# Patient Record
Sex: Female | Born: 1945 | Race: White | Hispanic: No | Smoking: Former smoker
Health system: Southern US, Community
[De-identification: ages and names within clinical notes are randomized; demographics above are authoritative.]

## PROBLEM LIST (undated history)

## (undated) DIAGNOSIS — Z87440 Personal history of urinary (tract) infections: Secondary | ICD-10-CM

## (undated) DIAGNOSIS — N281 Cyst of kidney, acquired: Secondary | ICD-10-CM

## (undated) DIAGNOSIS — E785 Hyperlipidemia, unspecified: Secondary | ICD-10-CM

## (undated) DIAGNOSIS — Q638 Other specified congenital malformations of kidney: Secondary | ICD-10-CM

## (undated) DIAGNOSIS — C679 Malignant neoplasm of bladder, unspecified: Secondary | ICD-10-CM

## (undated) DIAGNOSIS — Z860101 Personal history of adenomatous and serrated colon polyps: Secondary | ICD-10-CM

## (undated) DIAGNOSIS — I10 Essential (primary) hypertension: Secondary | ICD-10-CM

## (undated) DIAGNOSIS — Z973 Presence of spectacles and contact lenses: Secondary | ICD-10-CM

## (undated) DIAGNOSIS — Z86010 Personal history of colonic polyps: Secondary | ICD-10-CM

## (undated) HISTORY — DX: Hyperlipidemia, unspecified: E78.5

## (undated) HISTORY — PX: TONSILLECTOMY: SUR1361

---

## 1993-04-10 HISTORY — PX: OTHER SURGICAL HISTORY: SHX169

## 1998-04-10 HISTORY — PX: BUNIONECTOMY: SHX129

## 1999-08-15 ENCOUNTER — Other Ambulatory Visit: Admission: RE | Admit: 1999-08-15 | Discharge: 1999-08-15 | Payer: Self-pay | Admitting: Obstetrics and Gynecology

## 1999-10-07 ENCOUNTER — Encounter (INDEPENDENT_AMBULATORY_CARE_PROVIDER_SITE_OTHER): Payer: Self-pay

## 1999-10-07 ENCOUNTER — Other Ambulatory Visit: Admission: RE | Admit: 1999-10-07 | Discharge: 1999-10-07 | Payer: Self-pay | Admitting: Obstetrics and Gynecology

## 2000-08-16 ENCOUNTER — Other Ambulatory Visit: Admission: RE | Admit: 2000-08-16 | Discharge: 2000-08-16 | Payer: Self-pay | Admitting: Obstetrics and Gynecology

## 2000-12-22 ENCOUNTER — Emergency Department (HOSPITAL_COMMUNITY): Admission: EM | Admit: 2000-12-22 | Discharge: 2000-12-23 | Payer: Self-pay | Admitting: Emergency Medicine

## 2000-12-22 ENCOUNTER — Encounter: Payer: Self-pay | Admitting: Emergency Medicine

## 2001-08-02 ENCOUNTER — Encounter: Payer: Self-pay | Admitting: Obstetrics and Gynecology

## 2001-08-02 ENCOUNTER — Encounter: Admission: RE | Admit: 2001-08-02 | Discharge: 2001-08-02 | Payer: Self-pay | Admitting: Obstetrics and Gynecology

## 2008-05-01 ENCOUNTER — Ambulatory Visit: Payer: Self-pay | Admitting: Pulmonary Disease

## 2008-05-01 ENCOUNTER — Inpatient Hospital Stay (HOSPITAL_COMMUNITY): Admission: EM | Admit: 2008-05-01 | Discharge: 2008-05-05 | Payer: Self-pay | Admitting: Emergency Medicine

## 2008-05-04 ENCOUNTER — Encounter: Payer: Self-pay | Admitting: Internal Medicine

## 2010-05-01 ENCOUNTER — Encounter: Payer: Self-pay | Admitting: Internal Medicine

## 2010-05-01 ENCOUNTER — Encounter: Payer: Self-pay | Admitting: Emergency Medicine

## 2010-07-25 LAB — CBC
HCT: 29.3 % — ABNORMAL LOW (ref 36.0–46.0)
Hemoglobin: 9.8 g/dL — ABNORMAL LOW (ref 12.0–15.0)
MCHC: 33.2 g/dL (ref 30.0–36.0)
MCHC: 33.3 g/dL (ref 30.0–36.0)
MCHC: 33.4 g/dL (ref 30.0–36.0)
MCV: 89.2 fL (ref 78.0–100.0)
MCV: 91 fL (ref 78.0–100.0)
Platelets: 197 10*3/uL (ref 150–400)
Platelets: 209 10*3/uL (ref 150–400)
Platelets: 222 10*3/uL (ref 150–400)
Platelets: 223 10*3/uL (ref 150–400)
RBC: 3.32 MIL/uL — ABNORMAL LOW (ref 3.87–5.11)
RDW: 18.9 % — ABNORMAL HIGH (ref 11.5–15.5)
WBC: 6.5 10*3/uL (ref 4.0–10.5)
WBC: 8 10*3/uL (ref 4.0–10.5)

## 2010-07-25 LAB — GLUCOSE, CAPILLARY
Glucose-Capillary: 172 mg/dL — ABNORMAL HIGH (ref 70–99)
Glucose-Capillary: 174 mg/dL — ABNORMAL HIGH (ref 70–99)
Glucose-Capillary: 202 mg/dL — ABNORMAL HIGH (ref 70–99)
Glucose-Capillary: 216 mg/dL — ABNORMAL HIGH (ref 70–99)
Glucose-Capillary: 226 mg/dL — ABNORMAL HIGH (ref 70–99)
Glucose-Capillary: 289 mg/dL — ABNORMAL HIGH (ref 70–99)
Glucose-Capillary: 93 mg/dL (ref 70–99)

## 2010-07-25 LAB — ACETAMINOPHEN LEVEL: Acetaminophen (Tylenol), Serum: <10 ug/mL — ABNORMAL LOW (ref 10–30)

## 2010-07-25 LAB — RAPID URINE DRUG SCREEN, HOSP PERFORMED
Benzodiazepines: POSITIVE — AB
Cocaine: NOT DETECTED
Tetrahydrocannabinol: NOT DETECTED

## 2010-07-25 LAB — POCT I-STAT 3, ART BLOOD GAS (G3+)
Acid-base deficit: 1 mmol/L (ref 0.0–2.0)
Acid-base deficit: 5 mmol/L — ABNORMAL HIGH (ref 0.0–2.0)
Bicarbonate: 25.4 mEq/L — ABNORMAL HIGH (ref 20.0–24.0)
O2 Saturation: 90 %
O2 Saturation: 96 %
Patient temperature: 96.6
Patient temperature: 97.6
TCO2: 23 mmol/L (ref 0–100)
TCO2: 27 mmol/L (ref 0–100)
pCO2 arterial: 41.3 mmHg (ref 35.0–45.0)

## 2010-07-25 LAB — CULTURE, BLOOD (ROUTINE X 2): Culture: NO GROWTH

## 2010-07-25 LAB — POCT I-STAT, CHEM 8
BUN: 49 mg/dL — ABNORMAL HIGH (ref 6–23)
Chloride: 102 mEq/L (ref 96–112)
Creatinine, Ser: 2.4 mg/dL — ABNORMAL HIGH (ref 0.4–1.2)
Glucose, Bld: 144 mg/dL — ABNORMAL HIGH (ref 70–99)
Potassium: 4.2 mEq/L (ref 3.5–5.1)
Sodium: 135 mEq/L (ref 135–145)

## 2010-07-25 LAB — PROTIME-INR
Prothrombin Time: 14.8 seconds (ref 11.6–15.2)
Prothrombin Time: 17.1 seconds — ABNORMAL HIGH (ref 11.6–15.2)

## 2010-07-25 LAB — BASIC METABOLIC PANEL
BUN: 12 mg/dL (ref 6–23)
BUN: 18 mg/dL (ref 6–23)
BUN: 35 mg/dL — ABNORMAL HIGH (ref 6–23)
CO2: 24 mEq/L (ref 19–32)
Chloride: 104 mEq/L (ref 96–112)
Chloride: 106 mEq/L (ref 96–112)
Creatinine, Ser: 1.14 mg/dL (ref 0.4–1.2)
Creatinine, Ser: 1.22 mg/dL — ABNORMAL HIGH (ref 0.4–1.2)
Creatinine, Ser: 1.64 mg/dL — ABNORMAL HIGH (ref 0.4–1.2)
GFR calc non Af Amer: 49 mL/min — ABNORMAL LOW (ref >60)
Glucose, Bld: 268 mg/dL — ABNORMAL HIGH (ref 70–99)
Potassium: 4 mEq/L (ref 3.5–5.1)
Potassium: 4.3 mEq/L (ref 3.5–5.1)

## 2010-07-25 LAB — URINE CULTURE: Culture: NO GROWTH

## 2010-07-25 LAB — DIFFERENTIAL
Basophils Relative: 1 % (ref 0–1)
Eosinophils Absolute: 0.2 10*3/uL (ref 0.0–0.7)
Lymphocytes Relative: 28 % (ref 12–46)
Lymphs Abs: 2.2 10*3/uL (ref 0.7–4.0)
Monocytes Absolute: 0.6 10*3/uL (ref 0.1–1.0)
Monocytes Relative: 7 % (ref 3–12)
Neutro Abs: 4.9 10*3/uL (ref 1.7–7.7)

## 2010-07-25 LAB — URINALYSIS, ROUTINE W REFLEX MICROSCOPIC
Glucose, UA: NEGATIVE mg/dL
Hgb urine dipstick: NEGATIVE
Specific Gravity, Urine: 1.013 (ref 1.005–1.030)
Urobilinogen, UA: 1 mg/dL (ref 0.0–1.0)

## 2010-07-25 LAB — HEPATIC FUNCTION PANEL
ALT: 14 U/L (ref 0–35)
AST: 19 U/L (ref 0–37)
Alkaline Phosphatase: 75 U/L (ref 39–117)
Bilirubin, Direct: 0.3 mg/dL (ref 0.0–0.3)
Total Bilirubin: 0.8 mg/dL (ref 0.3–1.2)

## 2010-07-25 LAB — CARDIAC PANEL(CRET KIN+CKTOT+MB+TROPI)
CK, MB: 1.4 ng/mL (ref 0.3–4.0)
Total CK: 55 U/L (ref 7–177)
Troponin I: 0.01 ng/mL (ref 0.00–0.06)
Troponin I: <0.01 ng/mL (ref 0.00–0.06)

## 2010-07-25 LAB — CK TOTAL AND CKMB (NOT AT ARMC)
CK, MB: 1.8 ng/mL (ref 0.3–4.0)
Total CK: 83 U/L (ref 7–177)

## 2010-07-25 LAB — TROPONIN I: Troponin I: <0.01 ng/mL (ref 0.00–0.06)

## 2010-07-25 LAB — TRICYCLICS SCREEN, URINE: TCA Scrn: POSITIVE — AB

## 2010-07-25 LAB — CARBOXYHEMOGLOBIN: O2 Saturation: 55.6 %

## 2010-08-23 NOTE — Discharge Summary (Signed)
NAMETHOMASENE, Barton             ACCOUNT NO.:  [id number]   MEDICAL RECORD NO.:  [id number]          PATIENT TYPE:  INP   LOCATION:  2115                         FACILITY:  [facility]   PHYSICIAN:  Oretha Milch, MD      DATE OF BIRTH:  05/22/1945   DATE OF ADMISSION:  05/01/2008  DATE OF DISCHARGE:  05/05/2008                               DISCHARGE SUMMARY   FINAL DIAGNOSES:  1. Shock (unspecified).  Presumed multifactorial in the setting of      unintentional morphine overdose, polypharmacy, and hypovolemia,      resolved.  2. Atrial fibrillation with rapid ventricular response.  3. Acute renal failure.  4. Hypothyroidism.  5. Altered mental status.  6. Presumed pneumonia.  7. Presumed chronic obstructive pulmonary disease.  8. Diarrhea (resolved).   LABORATORY DATA:  INR of 1.4, this was obtained on May 05, 2008.  Sodium 130, potassium 3.8, chloride 106, CO2 of 24, glucose 160, BUN 12,  and creatinine 1.22.  Hemoglobin 11, hematocrit 32.9, platelet count  223, and white blood cell count 8.6.  Blood culture and urine culture  were both negative.   PROCEDURES:  1. Left radial A-line, this was placed on May 02, 2007 and removed      on May 04, 2007.  2. Right subclavian triple-lumen catheter placed on January 22 and      removed on January 24.   ANTIBIOTICS:  I empirically treated with Avelox and Azactam, this was on  May 01, 2008 to May 03, 2008.   BRIEF HISTORY:  This is a 65 year old obese female presented on May 01, 2008, with altered mental status, decreased level of consciousness,  and shock, which required aggressive volume resuscitation, and re-  support with vasoactive drips.   PAST MEDICAL HISTORY:  Complicated and include obesity, insulin-  dependent diabetes, coronary artery disease, atrial fibrillation,  chronic obstructive pulmonary disease, and smoking as well as  significant pain, anxiety which is chronic in nature.   HOSPITAL  COURSE BY DISCHARGE DIAGNOSES:  1. Shock (unspecified).  Ms. [name] was admitted to the Intensive Care      following evaluation in the emergency room.  Ultimately, diagnostic      evaluation was not consistent with severe sepsis or septic shock,      more likely felt to be unintentional with morphine overdose given      her morphine was filled on April 10, 2008, and upon time of      evaluation, pill bottle was empty.  She believes she may have been      unintentionally taking morphine when she was intending to take her      Flexeril as needed, and then continuing her as needed morphine as      previously prescribed.  At any rate, [name]'s antihypertensives,      narcotics, and other anxiolytic medications were held during her      acute hospitalization.  She required IV fluid crystalloid      resuscitation, as well as re-vasoactive support on Levophed.  She      was successfully  weaned off vasoactive support, her blood pressure      normalized, and upon time of discharge, she is hemodynamically      stable with shock resolved.  2. Atrial fibrillation with rapid ventricular response.  Ms. [name]      does have a known history of atrial fibrillation for which at home,      she is on Cardizem, diltiazem, and Coumadin.  During her shock      resuscitation, her Cardizem was held.  Unfortunately, this did      result in her rapid ventricular response and her already known      atrial fibrillation.  After resuming her home Cardizem dose, this      is now resolved.  She continues to be in atrial fibrillation, she      will be discharged to home on her normal regimen consisting of      diltiazem, digoxin, and Cardizem.  A special note upon time of      discharge, [name]'s INR is subtherapeutic at 1.4.  I have      discussed this with her primary care physician and he will follow      up this in the outpatient setting, she will be placed on her      maintenance Coumadin dosing as previously  prescribed.  3. Acute renal failure.  This is secondary to volume depletion and      shock.  Upon time of discharge, her creatinine has normalized, her      blood pressure has improved.  Upon time of discharge given her      recent renal injury and normal blood pressure at this point      recorded at 124/69, we will hold off on resuming her lisinopril,      furosemide, as well as her isosorbide mononitrate.  We will defer      this to her primary care physician as to timing of      resuming/reintroduced these medications.  From her blood pressure      standpoint, she will be discharged to home as previously mentioned      on Cardizem, which seemed to be controlling blood pressure well at      this point.  4. Hypothyroidism.  For this, she will continue her Synthroid.  5. Altered mental status.  This is now resolved, and presumed      secondary to unintentional overdose of morphine.  She has a      complicated chronic pain history, as well as a chronic anxiety      history.  She has a pain contract with her orthopedist, therefore,      she will not be sent home with a morphine prescription.  Her      regimen has been altered and as far as her Seroquel has been half      dosed and her Prozac has been decreased, and she has been      instructed to discontinue Lyrica.  6. Presumed pneumonia.  No clear evidence of this clinically;      therefore, antibiotics were discontinued.  7. Probable chronic obstructive pulmonary disease.  For this, she will      continue p.r.n. ProAir.  8. Diarrhea.  This is likely secondary to antibiotics and is now      resolved.  9. Diabetes type 2 with hyperglycemia.  For this, she will resume her      home diabetic regimen.   DISCHARGE INSTRUCTIONS:  DIET:  Low-carbohydrate diet.   FOLLOWUP:  Follow up with Dr. Ludwig Clarks on Friday, May 08, 2008, at 12  noon.   DISCHARGE MEDICATIONS:  1. Coumadin 2.5 mg daily.  2. Lantus 82 units daily subcu.  3.  Omeprazole 40 daily.  4. Novolin N 40 units before bed.  5. Apidra 20 units before meals.  6. Seroquel 100 mg p.o. twice a day.  She has 200 mg tablets at home,      and has been instructed to take half a tablet twice a day.  7. Diazepam 5 mg 3 times a day.  8. Trilipix 135 mg daily.  9. Lanoxin 0.125 mg daily.  10.Fluoxetine 40 mg daily.  11.Crestor 40 mg daily.  12.Glipizide 10 mg daily.  13.Synthroid 75 mcg daily.  14.Morphine 15 mg every 6 hours as needed for pain.  15.ProAir HFA 2 puffs as needed every 4 hours for shortness of breath.   She was given a clear bag writing clearly with do not take medications  upon the time of discharge with the intention to slowly add that  medications as indicated with the guidance of her primary care  physician.  In this bag, her medication include Lyrica, Flexeril,  lisinopril, Lasix, and her isosorbide mononitrate.   Upon time of discharge, Ms. [name] has met maximum benefit from inpatient  stay.  She is medically cleared for discharge following the treatments  and discharge diagnoses mentioned above.  I have spoken to her primary  care physician via phone and updated him on her progress and medical  case.  She is now cleared medically for discharge with followup as  indicated above.   Over 30 minutes of time was dedicated to discharge instruction and  planning.      Zenia Resides, NP      Oretha Milch, MD  Electronically Signed    PB/MEDQ  D:  05/05/2008  T:  05/05/2008  Job:  [id number]   cc:   Ralene Ok, M.D.

## 2010-08-23 NOTE — H&P (Signed)
NAMEPRINCESSA, LESMEISTER NO.:  [id number]   MEDICAL RECORD NO.:  [id number]          PATIENT TYPE:  INP   LOCATION:  2115                         FACILITY:  [facility]   PHYSICIAN:  Oley Balm. Sung Amabile, MD   DATE OF BIRTH:  [birth year]   DATE OF ADMISSION:  05/01/2008  DATE OF DISCHARGE:                              HISTORY & PHYSICAL   ADMISSION DIAGNOSES:  1. Altered mental status.  2. Shock, unclear etiology.  3. Possible community-acquired pneumonia.   HISTORY OF PRESENT ILLNESS:  The patient is a 65 year old woman who  presented to the emergency department via transport by emergency medical  services on the afternoon of this day.  Per the records they got in the  evening of this day, she was speaking to a home health nurse on the  phone and apparently lost consciousness.  The home health nurse  despatched EMS to the home and she was found minimally responsive.  She  was transported to emergency department at [facility]  where she was found to be hypotensive.  Over the next 2 to 3 hours, she  received repeated boluses of normal saline with transient improvement in  her blood pressure.  However, her blood pressure remains low and  consequently critical care medicine is asked to evaluate her for further  management.  Upon my evaluation, she is quite lethargic but answers  questions seemingly appropriately.  She denies shortness of breath and  pain.  She has no antecedent cough, sputum production, or chest pain.  She denies hemoptysis, nausea, vomiting, diarrhea, dysuria.  She denies  taking excess morphine beyond what is already prescribed and what is her  usual dose.   PAST MEDICAL HISTORY:  1. Coronary artery disease,  2. COPD.  3. Atrial fibrillation.  4. Mitral insufficiency.  5. Morbid obesity.  6. Peripheral vascular disease.  7. Congestive heart failure.  8. Chronic venous stasis.   CURRENT MEDICATIONS:  (These are derived from previous  hospital  documentation).  1. Coumadin.  2. Morphine 15 mg q.6 h.  3. Lantus 82 units daily.  4. Novolin N 40 units q.h.s.  5. Lasix 80 mg daily.  6. Apidra 20 units subcu q.a.c.  7. Synthroid 75 mcg daily  8. Lisinopril 5 mg b.i.d.  9. Seroquel 300 mg b.i.d.  10.Valium 5 mg t.i.d.  11.Trilipix 130 mg daily.  12.Imdur 30 mg daily.  13.Omeprazole 40 mg daily.  14.Digoxin 0.125 mg daily.  15.Lyrica 75 mg t.i.d.  16.Prozac 60 mg daily.  17.Crestor 40 mg daily.  18.Glipizide 10 mg daily.  19.Ultram 100 mg daily.  20.Provera h.s. q.i.d.   SOCIAL HISTORY:  Per records, she is disabled with cough and as of her  last hospitalization in September, it is documented that she is a  smoker.   FAMILY HISTORY:  Reviewed from prior records and otherwise  noncontributory.   REVIEW OF SYSTEMS:  As per the history present illness and otherwise  unavailable and noncontributory.   PHYSICAL EXAMINATION:  VITAL SIGNS:  Temperature 97.8, current blood  pressure is 89/49, pulse 123 and irregular, respirations  18-20 and  unlabored.  Oxygen saturation 90% on 4 liters by nasal cannula.  GENERAL:  She is lethargic but responds to questions with some  difficulty.  Her answers are seemingly appropriate.  HEENT: Reveals no acute abnormalities.  Cranial nerves appear to be  intact.  Pupils are normal and react to light.  NECK:  Supple without adenopathy or visible jugular venous distention.  Thyroid gland is normal.  CARDIAC:  Exam reveals tachycardia with an irregularly irregular rhythm  and no murmurs noted.  Chest is clear to auscultation and percussion  anteriorly.  Abdomen is obese, soft, nontender with diminished bowel  sounds.  There is no palpable organ enlargement.  EXTREMITIES:  Reveal  chronic venous stasis changes with trace bilateral pretibial edema which  is symmetric.  NEUROLOGIC:  Cranial nerves as documented above.  Moves all extremities  symmetrically.   DATA:  Initial chest x-ray  reveals possible right lower lobe infiltrate  versus atelectasis.  CT scan of the head was poor quality due to motion  artifact the revealed no acute abnormalities.  Laboratory data reveals a  hemoglobin of 9.8, white blood cell count 8009, no left shift, platelets  normal, chemistries normal except a creatinine of 2.4 and glucose of  144, prothrombin time of 17.1 seconds with an INR 1.3, liver function  tests normal, urine drug screen positive for opiates, benzos, and  tricyclic antidepressants.  EKG reveals atrial fibrillation with rapid  ventricular response, nonspecific interventricular conduction delay,  nonspecific ST and T-wave abnormalities.   IMPRESSION:  Altered mental status and shock refractory with IV fluids -  etiology unclear.  It is possible that she took an excessive  medications.  She is on a multitude of medications which is a regimen  that I think put her on higher risk for adverse reactions such as this.  However, she denies indiscretion with her prescribed medications.  Other  etiologies would include cardiogenic shock related to atrial  fibrillation or possibly even an acute coronary syndrome (though EKG  shows no definite evidence of ischemia).  Septic shock seems unlikely,  but is a possibility given the appearance of possible infiltrate in the  right lower lobe up.   PLAN:  She will be admitted to the intensive care unit for resuscitation  and stabilization.  Central venous catheter has been placed in the  emergency department.  We will resuscitate her to a CVP goal of 8-12.  Mean arterial pressure goal of 60.  Phenylephrine will be used as a  pressor due to her atrial fibrillation and tachycardia.  Empiric  antibiotics with Avelox and aztreonam have been ordered (note  cephalosporin allergy).  Cardiac markers will be ordered.  All of her  medications as listed above will be held except for Synthroid and  digoxin which will be administered  intravenously.      Oley Balm Sung Amabile, MD  Electronically Signed     DBS/MEDQ  D:  05/01/2008  T:  05/02/2008  Job:  [id number]   cc:   Nanetta Batty, M.D.

## 2011-03-18 ENCOUNTER — Other Ambulatory Visit (INDEPENDENT_AMBULATORY_CARE_PROVIDER_SITE_OTHER): Payer: Self-pay

## 2011-03-18 DIAGNOSIS — N390 Urinary tract infection, site not specified: Secondary | ICD-10-CM

## 2011-05-14 ENCOUNTER — Ambulatory Visit: Payer: Self-pay | Admitting: Physician Assistant

## 2011-05-14 DIAGNOSIS — R319 Hematuria, unspecified: Secondary | ICD-10-CM

## 2011-05-14 DIAGNOSIS — R350 Frequency of micturition: Secondary | ICD-10-CM

## 2011-05-14 LAB — POCT URINALYSIS DIPSTICK
Protein, UA: 100
Spec Grav, UA: 1.015
Urobilinogen, UA: 0.2
pH, UA: 6

## 2011-05-14 LAB — POCT UA - MICROSCOPIC ONLY
Casts, Ur, LPF, POC: NEGATIVE
Crystals, Ur, HPF, POC: NEGATIVE
Epithelial cells, urine per micros: NEGATIVE
Yeast, UA: NEGATIVE

## 2011-05-14 MED ORDER — CIPROFLOXACIN HCL 500 MG PO TABS: 500.0000 mg | ORAL_TABLET | Freq: Two times a day (BID) | ORAL | Status: AC

## 2011-05-14 NOTE — Progress Notes (Signed)
  Subjective:    Patient ID: [name], female    DOB: [birth year], 66 y.o.   MRN: [id number]  HPI Patient presents with several days of urinary urgency frequency and burning. She notes the urine has changed to a darker color and believes that may be due to blood. She had some low-grade fever last night accompanied by some queasiness but has had no vomiting or diarrhea. No back pain or lower abdominal pain. She has a history of urinary tract infections and her symptoms now are consistent with previous infections.   Review of Systems  Constitutional: Positive for fever. Negative for chills, diaphoresis and fatigue.  Gastrointestinal: Positive for nausea. Negative for vomiting and abdominal pain.  Genitourinary: Positive for dysuria, urgency and frequency. Negative for flank pain, decreased urine volume, vaginal bleeding, vaginal discharge, enuresis, difficulty urinating, genital sores, vaginal pain, pelvic pain and dyspareunia.  Musculoskeletal: Negative for back pain.       Objective:   Physical Exam  Vitals reviewed. Constitutional: She is oriented to person, place, and time. Vital signs are normal. She appears well-developed and well-nourished. No distress.  HENT:  Head: Normocephalic and atraumatic.  Cardiovascular: Normal rate, regular rhythm and normal heart sounds.   Pulmonary/Chest: Effort normal and breath sounds normal.  Abdominal: Soft. Normal appearance and bowel sounds are normal. She exhibits no distension and no mass. There is no hepatosplenomegaly. There is no tenderness. There is no rebound, no guarding and no CVA tenderness.  Neurological: She is alert and oriented to person, place, and time.  Skin: Skin is warm and dry.     UA reviewed. Consistent with UTI. Urine culture is pending.     Assessment & Plan:  1. Urinary urgency and frequency. Dysuria.  Suspect UTI. Cipro 500 mg 1 by mouth twice a day x5 days. Push fluids. Rest. Return if symptoms worsen  or persist.

## 2011-05-16 LAB — URINE CULTURE

## 2011-05-19 NOTE — Telephone Encounter (Signed)
This encounter was started in error.

## 2011-11-02 ENCOUNTER — Ambulatory Visit: Payer: Self-pay | Admitting: Family Medicine

## 2011-11-02 VITALS — BP 116/82 | HR 67 | Temp 97.7°F | Resp 16 | Ht 70.0 in | Wt 208.0 lb

## 2011-11-02 DIAGNOSIS — S81809A Unspecified open wound, unspecified lower leg, initial encounter: Secondary | ICD-10-CM

## 2011-11-02 DIAGNOSIS — L237 Allergic contact dermatitis due to plants, except food: Secondary | ICD-10-CM

## 2011-11-02 DIAGNOSIS — R58 Hemorrhage, not elsewhere classified: Secondary | ICD-10-CM

## 2011-11-02 DIAGNOSIS — S81009A Unspecified open wound, unspecified knee, initial encounter: Secondary | ICD-10-CM

## 2011-11-02 DIAGNOSIS — L255 Unspecified contact dermatitis due to plants, except food: Secondary | ICD-10-CM

## 2011-11-02 DIAGNOSIS — I998 Other disorder of circulatory system: Secondary | ICD-10-CM

## 2011-11-02 DIAGNOSIS — S81802A Unspecified open wound, left lower leg, initial encounter: Secondary | ICD-10-CM

## 2011-11-02 MED ORDER — DOXYCYCLINE HYCLATE 100 MG PO TABS: 100.0000 mg | ORAL_TABLET | Freq: Two times a day (BID) | ORAL | Status: AC

## 2011-11-02 NOTE — Patient Instructions (Signed)
Return to the clinic or go to the nearest emergency room if any of your symptoms worsen or new symptoms occur.  Can apply over the counter hydrocortisone to poison ivy areas twice per day as needed.

## 2011-11-02 NOTE — Progress Notes (Signed)
  Subjective:    Patient ID: [name], female    DOB: [birth year], 66 y.o.   MRN: [id number]  HPI [name] is a 65 y.o. female  Noticed bump on L side of groin - noticed last night - bruising around central bump. No pain or itching.   No known ticks removed recently, Out in woods few days ago. Has outside dog, but no known ticks.  No fever, abd pain, or other rash except few spots of poison ivy on legs.   Tx: none   Review of Systems     Objective:   Physical Exam  Constitutional: She is oriented to person, place, and time.  Neurological: She is alert and oriented to person, place, and time.  Skin: Skin is warm and dry. Ecchymosis noted.     Psychiatric: She has a normal mood and affect. Her behavior is normal.   See photo in system.     Assessment & Plan:  [name] is a 66 y.o. female 1. Wound of left leg  doxycycline (VIBRA-TABS) 100 MG tablet  2. Ecchymosis    3. Poison ivy     L upper leg wound/bite - possible spider or other bite into capillary with secondary bruising, vs atypical presentation of target lesion.  Start doxycycline 100mg  BID x 10 days.  SED, and rtc precautions discussed.   Patient Instructions  Return to the clinic or go to the nearest emergency room if any of your symptoms worsen or new symptoms occur.  Can apply over the counter hydrocortisone to poison ivy areas twice per day as needed.

## 2011-12-22 ENCOUNTER — Ambulatory Visit: Payer: Self-pay | Admitting: Internal Medicine

## 2011-12-22 VITALS — BP 124/84 | HR 90 | Temp 98.0°F | Resp 16 | Ht 69.5 in | Wt 212.0 lb

## 2011-12-22 DIAGNOSIS — L509 Urticaria, unspecified: Secondary | ICD-10-CM

## 2011-12-22 DIAGNOSIS — R21 Rash and other nonspecific skin eruption: Secondary | ICD-10-CM

## 2011-12-22 MED ORDER — PREDNISONE 10 MG PO TABS: ORAL_TABLET | ORAL | Status: DC

## 2011-12-22 MED ORDER — HYDROXYZINE HCL 25 MG PO TABS: 25.0000 mg | ORAL_TABLET | Freq: Three times a day (TID) | ORAL | Status: AC | PRN

## 2011-12-22 MED ORDER — METHYLPREDNISOLONE SODIUM SUCC 125 MG IJ SOLR
125.0000 mg | Freq: Once | INTRAMUSCULAR | Status: AC
  Administered 2011-12-22: 125 mg via INTRAMUSCULAR

## 2011-12-22 NOTE — Progress Notes (Signed)
  Subjective:    Patient ID: [name], female    DOB: 1946/01/29, 66 y.o.   MRN: [id number]  HPI  Pt presents to clinic for eval of rash.  She has had this rash for about 4 days.  Started after a walk in the woods when she had on long pants and a t-shirt.  Then she developed a papular rash that has spread and is now covering 70% of body.  Very itchy.  New rash every day.  Sleeps with her dog who goes into the woods all the time. She has had no new meds, lotions, soaps, foods or exposures that she knows of.  She has never had this type of rash before. No fever, chills, she feels like.  She has had no tick bites known.  Review of Systems  Constitutional: Negative for fever, chills and fatigue.  HENT: Negative for congestion.   Musculoskeletal: Negative for myalgias and arthralgias.  Skin: Positive for rash.       Objective:   Physical Exam  Vitals reviewed. Constitutional: She is oriented to person, place, and time. She appears well-developed and well-nourished.  HENT:  Head: Normocephalic and atraumatic.  Right Ear: External ear normal.  Left Ear: External ear normal.  Nose: Nose normal.  Eyes: Conjunctivae normal are normal.  Pulmonary/Chest: Effort normal.  Neurological: She is alert and oriented to person, place, and time.  Skin: Skin is warm and dry. Rash noted.       70% of body covered with erythematous urticarial rash with some papules, excoriations are present on a lot of the rash.  Scalp and feet, palms and soles are spared.  Psychiatric: She has a normal mood and affect. Her behavior is normal. Thought content normal.   Results for orders placed in visit on 12/22/11  GLUCOSE, POCT (MANUAL RESULT ENTRY)      Component Value Range   POC Glucose 86  70 - 99 mg/dl       Assessment & Plan:   1. Rash  POCT glucose (manual entry), methylPREDNISolone sodium succinate (SOLU-MEDROL) 125 mg/2 mL injection 125 mg, predniSONE (DELTASONE) 10 MG tablet, hydrOXYzine  (ATARAX/VISTARIL) 25 MG tablet  2. Urticaria      I believe this is poison ivy with a hive reaction.  Her biggest concern is that she is leaving on a plane tomorrow to visit her pregnant daughter in [place].  D/w pt what to expect from prednisone use.  Pt was seen with Dr. Perrin Maltese.

## 2013-01-06 HISTORY — PX: COLONOSCOPY W/ POLYPECTOMY: SHX1380

## 2013-06-30 ENCOUNTER — Ambulatory Visit (INDEPENDENT_AMBULATORY_CARE_PROVIDER_SITE_OTHER): Payer: Medicare Other | Admitting: Family Medicine

## 2013-06-30 VITALS — BP 104/64 | HR 84 | Temp 98.0°F | Resp 18 | Ht 69.5 in | Wt 210.0 lb

## 2013-06-30 DIAGNOSIS — N390 Urinary tract infection, site not specified: Secondary | ICD-10-CM

## 2013-06-30 MED ORDER — CIPROFLOXACIN HCL 500 MG PO TABS: 500.0000 mg | ORAL_TABLET | Freq: Two times a day (BID) | ORAL | Status: DC

## 2013-06-30 NOTE — Patient Instructions (Signed)
The medicine should clear up your symptoms.    If you start having fevers, chills, back pain, nausea or vomiting, please come back or go to the ED.    Urinary Tract Infection Urinary tract infections (UTIs) can develop anywhere along your urinary tract. Your urinary tract is your body's drainage system for removing wastes and extra water. Your urinary tract includes two kidneys, two ureters, a bladder, and a urethra. Your kidneys are a pair of bean-shaped organs. Each kidney is about the size of your fist. They are located below your ribs, one on each side of your spine. CAUSES Infections are caused by microbes, which are microscopic organisms, including fungi, viruses, and bacteria. These organisms are so small that they can only be seen through a microscope. Bacteria are the microbes that most commonly cause UTIs. SYMPTOMS  Symptoms of UTIs may vary by age and gender of the patient and by the location of the infection. Symptoms in young women typically include a frequent and intense urge to urinate and a painful, burning feeling in the bladder or urethra during urination. Older women and men are more likely to be tired, shaky, and weak and have muscle aches and abdominal pain. A fever may mean the infection is in your kidneys. Other symptoms of a kidney infection include pain in your back or sides below the ribs, nausea, and vomiting. DIAGNOSIS To diagnose a UTI, your caregiver will ask you about your symptoms. Your caregiver also will ask to provide a urine sample. The urine sample will be tested for bacteria and white blood cells. White blood cells are made by your body to help fight infection. TREATMENT  Typically, UTIs can be treated with medication. Because most UTIs are caused by a bacterial infection, they usually can be treated with the use of antibiotics. The choice of antibiotic and length of treatment depend on your symptoms and the type of bacteria causing your infection. HOME CARE  INSTRUCTIONS  If you were prescribed antibiotics, take them exactly as your caregiver instructs you. Finish the medication even if you feel better after you have only taken some of the medication.  Drink enough water and fluids to keep your urine clear or pale yellow.  Avoid caffeine, tea, and carbonated beverages. They tend to irritate your bladder.  Empty your bladder often. Avoid holding urine for long periods of time.  Empty your bladder before and after sexual intercourse.  After a bowel movement, women should cleanse from front to back. Use each tissue only once. SEEK MEDICAL CARE IF:   You have back pain.  You develop a fever.  Your symptoms do not begin to resolve within 3 days. SEEK IMMEDIATE MEDICAL CARE IF:   You have severe back pain or lower abdominal pain.  You develop chills.  You have nausea or vomiting.  You have continued burning or discomfort with urination. MAKE SURE YOU:   Understand these instructions.  Will watch your condition.  Will get help right away if you are not doing well or get worse. Document Released: 01/04/2005 Document Revised: 09/26/2011 Document Reviewed: 05/05/2011 [facility] Patient Information 2014 [employer].

## 2013-06-30 NOTE — Progress Notes (Signed)
[name] is a 68 y.o. female who presents to [facility] today with complaints concerning for UTI:  1.  Concern for UTI: [name] is a 68 y.o. female who complains of urinary frequency, urgency, and dysuria x 3 days.  She denies any flank or back pain, fever, chills, vomiting, or abnormal vaginal discharge/itching/bleeding.  Does endorse some suprapubic pressure.  No history of incontinence.    The following portions of the patient's history were reviewed and updated as appropriate: allergies, current medications, past medical history, family and social history, and problem list.  Patient is a nonsmoker.    Past Medical History  Diagnosis Date  . Status post bunionectomy     bilateral   Past Surgical History  Procedure Laterality Date  . Tonsillectomy  1953    Medications reviewed. Current Outpatient Prescriptions  Medication Sig Dispense Refill  . ciprofloxacin (CIPRO) 500 MG tablet Take 1 tablet (500 mg total) by mouth 2 (two) times daily.  6 tablet  0   No current facility-administered medications for this visit.    ROS as above otherwise neg.      Objective:   Physical Exam BP 104/64  Pulse 84  Temp(Src) 98 F (36.7 C) (Oral)  Resp 18  Ht 5' 9.5" (1.765 m)  Wt 210 lb (95.255 kg)  BMI 30.58 kg/m2  SpO2 97% Gen:  Alert, cooperative patient who appears stated age in no acute distress.  Vital signs reviewed. Mouth: MMM Cardiac:  Regular rate and rhythm without murmur auscultated.  Good S1/S2. Pulm:  Clear to auscultation bilaterally with good air movement.  No wheezes or rales noted.   Abdomen:  Soft/ND/NT.  No CVA tenderness bilaterally   No results found for this or any previous visit (from the past 72 hour(s)).   Imp/Plan: 1.  UTI: - treat with Cipro 7 days - See instructions, FU if needed.  - sending for culture

## 2013-07-02 MED ORDER — CIPROFLOXACIN HCL 500 MG PO TABS: 500.0000 mg | ORAL_TABLET | Freq: Two times a day (BID) | ORAL | Status: DC

## 2013-07-02 NOTE — Addendum Note (Signed)
Addended by: Orion Crook on: 07/02/2013 11:40 AM   Modules accepted: Orders

## 2013-07-03 LAB — URINE CULTURE

## 2013-07-29 ENCOUNTER — Ambulatory Visit (INDEPENDENT_AMBULATORY_CARE_PROVIDER_SITE_OTHER): Payer: Medicare Other | Admitting: Family Medicine

## 2013-07-29 VITALS — BP 124/80 | HR 79 | Temp 98.1°F | Resp 16 | Ht 69.5 in | Wt 208.0 lb

## 2013-07-29 DIAGNOSIS — R319 Hematuria, unspecified: Secondary | ICD-10-CM

## 2013-07-29 DIAGNOSIS — N390 Urinary tract infection, site not specified: Secondary | ICD-10-CM

## 2013-07-29 DIAGNOSIS — R300 Dysuria: Secondary | ICD-10-CM

## 2013-07-29 LAB — POCT UA - MICROSCOPIC ONLY
Bacteria, U Microscopic: NEGATIVE
CASTS, UR, LPF, POC: NEGATIVE
CRYSTALS, UR, HPF, POC: NEGATIVE
Mucus, UA: NEGATIVE
YEAST UA: NEGATIVE

## 2013-07-29 LAB — POCT URINALYSIS DIPSTICK
Bilirubin, UA: NEGATIVE
Glucose, UA: NEGATIVE
Ketones, UA: NEGATIVE
LEUKOCYTES UA: NEGATIVE
NITRITE UA: NEGATIVE
SPEC GRAV UA: 1.025
UROBILINOGEN UA: 0.2
pH, UA: 5.5

## 2013-07-29 LAB — BASIC METABOLIC PANEL
BUN: 22 mg/dL (ref 6–23)
CHLORIDE: 105 meq/L (ref 96–112)
CO2: 25 mEq/L (ref 19–32)
CREATININE: 0.69 mg/dL (ref 0.50–1.10)
Calcium: 9.4 mg/dL (ref 8.4–10.5)
Glucose, Bld: 93 mg/dL (ref 70–99)
Potassium: 4.5 mEq/L (ref 3.5–5.3)
Sodium: 139 mEq/L (ref 135–145)

## 2013-07-29 MED ORDER — CEPHALEXIN 500 MG PO CAPS: 500.0000 mg | ORAL_CAPSULE | Freq: Two times a day (BID) | ORAL | Status: DC

## 2013-07-29 NOTE — Progress Notes (Addendum)
[facility] [address] [place] [address] [phone]  Date:  07/29/2013   Name:  [name]   DOB:  01/24/1946   MRN:  [id number]  PCP:  No PCP Per Patient    Chief Complaint: Dysuria   History of Present Illness:  [name] is a 68 y.o. very pleasant female patient who presents with the following:  She was here just about a month ago with possible UTI.  Treated with cipro and she did have a klebsiella UTI.   She got better, but then her sx returned again over the last 3 days or so.  She has noted some blood in her urine; she notes urgency but no pain.   No vomiting, back pain or fever.  She does feel tired.  She is generally very helthy.  She has not had any labs done in some time- she does not really see doctors except when she is ill  She has a history of frequent UTI over the years and these are her typical sx  There are no active problems to display for this patient.   Past Medical History  Diagnosis Date  . Status post bunionectomy     bilateral    Past Surgical History  Procedure Laterality Date  . Tonsillectomy  1953    History  Substance Use Topics  . Smoking status: Former Smoker -- 40 years    Types: Cigarettes  . Smokeless tobacco: Not on file     Comment: only smokes a couple cigs a day  . Alcohol Use: Yes    History reviewed. No pertinent family history.  Allergies  Allergen Reactions  . Hycodan [Hydrocodone-Homatropine]     hives    Medication list has been reviewed and updated.  Current Outpatient Prescriptions on File Prior to Visit  Medication Sig Dispense Refill  . ciprofloxacin (CIPRO) 500 MG tablet Take 1 tablet (500 mg total) by mouth 2 (two) times daily.  14 tablet  0   No current facility-administered medications on file prior to visit.    Review of Systems:  As per HPI- otherwise negative.   Physical Examination: Filed Vitals:   07/29/13 1156  BP: 124/80  Pulse:  79  Temp: 98.1 F (36.7 C)  Resp: 16   Filed Vitals:   07/29/13 1156  Height: 5' 9.5" (1.765 m)  Weight: 208 lb (94.348 kg)   Body mass index is 30.29 kg/(m^2). Ideal Body Weight: Weight in (lb) to have BMI = 25: 171.4  GEN: WDWN, NAD, Non-toxic, A & O x 3, looks well and younger than stated age 71: Atraumatic, Normocephalic. Neck supple. No masses, No LAD. Ears and Nose: No external deformity. CV: RRR, No M/G/R. No JVD. No thrill. No extra heart sounds. PULM: CTA B, no wheezes, crackles, rhonchi. No retractions. No resp. distress. No accessory muscle use. ABD: S, NT, ND, +BS. No rebound. No HSM.  No CVA tenderness  EXTR: No c/c/e NEURO Normal gait.  PSYCH: Normally interactive. Conversant. Not depressed or anxious appearing.  Calm demeanor.   Results for orders placed in visit on 07/29/13  POCT UA - MICROSCOPIC ONLY      Result Value Ref Range   WBC, Ur, HPF, POC 2-4     RBC, urine, microscopic tntc     Bacteria, U Microscopic NEG     Mucus, UA NEG     Epithelial cells, urine per micros 0-1     Crystals, Ur, HPF,  POC NEG     Casts, Ur, LPF, POC NEG     Yeast, UA NEG    POCT URINALYSIS DIPSTICK      Result Value Ref Range   Color, UA AMBER     Clarity, UA TURBID     Glucose, UA NEG     Bilirubin, UA NEG     Ketones, UA NEG     Spec Grav, UA 1.025     Blood, UA LARGE     pH, UA 5.5     Protein, UA >=300     Urobilinogen, UA 0.2     Nitrite, UA NEG     Leukocytes, UA Negative     Assessment and Plan: Dysuria - Plan: POCT UA - Microscopic Only, POCT urinalysis dipstick, Urine culture, cephALEXin (KEFLEX) 500 MG capsule  Hematuria  Frequent UTI - Plan: Basic metabolic panel  Treat for recurrent UTI with keflex, and check BMP Will plan further follow- up pending labs.  Signed Lamar Blinks, MD  Called 4/24 to go over labs.  Left detailed message on machine.  Urine culture is negative, BMP is normal. Please let me know if she continues to have any sx, and  I am going to refer her to urology for further evaluation.   Results for orders placed in visit on 07/29/13  URINE CULTURE      Result Value Ref Range   Colony Count NO GROWTH     Organism ID, Bacteria NO GROWTH    BASIC METABOLIC PANEL      Result Value Ref Range   Sodium 139  135 - 145 mEq/L   Potassium 4.5  3.5 - 5.3 mEq/L   Chloride 105  96 - 112 mEq/L   CO2 25  19 - 32 mEq/L   Glucose, Bld 93  70 - 99 mg/dL   BUN 22  6 - 23 mg/dL   Creat 0.69  0.50 - 1.10 mg/dL   Calcium 9.4  8.4 - 10.5 mg/dL  POCT UA - MICROSCOPIC ONLY      Result Value Ref Range   WBC, Ur, HPF, POC 2-4     RBC, urine, microscopic tntc     Bacteria, U Microscopic NEG     Mucus, UA NEG     Epithelial cells, urine per micros 0-1     Crystals, Ur, HPF, POC NEG     Casts, Ur, LPF, POC NEG     Yeast, UA NEG    POCT URINALYSIS DIPSTICK      Result Value Ref Range   Color, UA AMBER     Clarity, UA TURBID     Glucose, UA NEG     Bilirubin, UA NEG     Ketones, UA NEG     Spec Grav, UA 1.025     Blood, UA LARGE     pH, UA 5.5     Protein, UA >=300     Urobilinogen, UA 0.2     Nitrite, UA NEG     Leukocytes, UA Negative

## 2013-07-29 NOTE — Patient Instructions (Signed)
We are going to treat you for a UTI- this time I will try a 7 day antibiotic called keflex.  I will be in touch with your urine culture results.  Let me know if you do not feel better in the next 24- 36 hours.

## 2013-07-30 LAB — URINE CULTURE
COLONY COUNT: NO GROWTH
Organism ID, Bacteria: NO GROWTH

## 2013-08-01 NOTE — Addendum Note (Signed)
Addended by: Lamar Blinks C on: 08/01/2013 12:02 PM   Modules accepted: Orders

## 2013-09-29 ENCOUNTER — Other Ambulatory Visit: Payer: Self-pay | Admitting: Urology

## 2013-09-29 ENCOUNTER — Encounter: Payer: Self-pay | Admitting: Family Medicine

## 2013-09-29 DIAGNOSIS — C679 Malignant neoplasm of bladder, unspecified: Secondary | ICD-10-CM | POA: Insufficient documentation

## 2013-10-16 ENCOUNTER — Encounter (HOSPITAL_BASED_OUTPATIENT_CLINIC_OR_DEPARTMENT_OTHER): Payer: Self-pay | Admitting: *Deleted

## 2013-10-17 ENCOUNTER — Encounter (HOSPITAL_BASED_OUTPATIENT_CLINIC_OR_DEPARTMENT_OTHER): Payer: Self-pay | Admitting: *Deleted

## 2013-10-17 NOTE — Progress Notes (Signed)
NPO AFTER MN WITH EXCEPTION CLEAR LIQUIDS UNTIL 0730 (NO CREAM/ MILK PRODUCTS).  ARRIVE AT 1200. NEEDS ISTAT 8.

## 2013-10-22 ENCOUNTER — Encounter (HOSPITAL_BASED_OUTPATIENT_CLINIC_OR_DEPARTMENT_OTHER): Payer: Self-pay | Admitting: *Deleted

## 2013-10-22 ENCOUNTER — Encounter (HOSPITAL_COMMUNITY)
Admission: RE | Disposition: A | Discharge status: Home / Self Care | Payer: Self-pay | Source: Ambulatory Visit | Attending: Urology

## 2013-10-22 ENCOUNTER — Ambulatory Visit (HOSPITAL_BASED_OUTPATIENT_CLINIC_OR_DEPARTMENT_OTHER): Payer: Medicare Other | Admitting: Anesthesiology

## 2013-10-22 ENCOUNTER — Encounter (HOSPITAL_BASED_OUTPATIENT_CLINIC_OR_DEPARTMENT_OTHER): Payer: Medicare Other | Admitting: Anesthesiology

## 2013-10-22 ENCOUNTER — Observation Stay (HOSPITAL_BASED_OUTPATIENT_CLINIC_OR_DEPARTMENT_OTHER)
Admission: RE | Admit: 2013-10-22 | Discharge: 2013-10-23 | Disposition: A | Discharge status: Home / Self Care | Payer: Medicare Other | Source: Ambulatory Visit | Attending: Urology | Admitting: Urology

## 2013-10-22 DIAGNOSIS — Z87440 Personal history of urinary (tract) infections: Secondary | ICD-10-CM | POA: Insufficient documentation

## 2013-10-22 DIAGNOSIS — N3020 Other chronic cystitis without hematuria: Secondary | ICD-10-CM | POA: Insufficient documentation

## 2013-10-22 DIAGNOSIS — C679 Malignant neoplasm of bladder, unspecified: Principal | ICD-10-CM | POA: Insufficient documentation

## 2013-10-22 DIAGNOSIS — N281 Cyst of kidney, acquired: Secondary | ICD-10-CM | POA: Insufficient documentation

## 2013-10-22 DIAGNOSIS — Q638 Other specified congenital malformations of kidney: Secondary | ICD-10-CM | POA: Insufficient documentation

## 2013-10-22 DIAGNOSIS — Z87891 Personal history of nicotine dependence: Secondary | ICD-10-CM | POA: Insufficient documentation

## 2013-10-22 HISTORY — PX: CYSTOSCOPY W/ URETERAL STENT PLACEMENT: SHX1429

## 2013-10-22 HISTORY — DX: Malignant neoplasm of bladder, unspecified: C67.9

## 2013-10-22 HISTORY — PX: TRANSURETHRAL RESECTION OF BLADDER TUMOR WITH GYRUS (TURBT-GYRUS): SHX6458

## 2013-10-22 HISTORY — DX: Presence of spectacles and contact lenses: Z97.3

## 2013-10-22 HISTORY — DX: Personal history of urinary (tract) infections: Z87.440

## 2013-10-22 LAB — POCT I-STAT, CHEM 8
BUN: 17 mg/dL (ref 6–23)
CHLORIDE: 106 meq/L (ref 96–112)
Calcium, Ion: 1.24 mmol/L (ref 1.13–1.30)
Creatinine, Ser: 0.8 mg/dL (ref 0.50–1.10)
GLUCOSE: 99 mg/dL (ref 70–99)
HCT: 42 % (ref 36.0–46.0)
Hemoglobin: 14.3 g/dL (ref 12.0–15.0)
Potassium: 4.3 mEq/L (ref 3.7–5.3)
Sodium: 143 mEq/L (ref 137–147)
TCO2: 21 mmol/L (ref 0–100)

## 2013-10-22 SURGERY — TRANSURETHRAL RESECTION OF BLADDER TUMOR WITH GYRUS (TURBT-GYRUS)
Anesthesia: General | Site: Bladder

## 2013-10-22 MED ORDER — IOHEXOL 350 MG/ML SOLN
INTRAVENOUS | Status: DC | PRN
  Administered 2013-10-22: 24 mL via URETHRAL

## 2013-10-22 MED ORDER — HYDROMORPHONE HCL PF 1 MG/ML IJ SOLN
0.5000 mg | INTRAMUSCULAR | Status: DC | PRN
  Filled 2013-10-22: qty 1

## 2013-10-22 MED ORDER — ACETAMINOPHEN 10 MG/ML IV SOLN
INTRAVENOUS | Status: DC | PRN
  Administered 2013-10-22: 1000 mg via INTRAVENOUS

## 2013-10-22 MED ORDER — HYDROMORPHONE HCL 2 MG PO TABS
2.0000 mg | ORAL_TABLET | ORAL | Status: DC | PRN
  Filled 2013-10-22: qty 1

## 2013-10-22 MED ORDER — ROCURONIUM BROMIDE 100 MG/10ML IV SOLN
INTRAVENOUS | Status: DC | PRN
  Administered 2013-10-22: 35 mg via INTRAVENOUS
  Administered 2013-10-22 (×2): 10 mg via INTRAVENOUS

## 2013-10-22 MED ORDER — GENTAMICIN SULFATE 40 MG/ML IJ SOLN
5.0000 mg/kg | Freq: Once | INTRAVENOUS | Status: AC
  Administered 2013-10-22: 400 mg via INTRAVENOUS
  Filled 2013-10-22: qty 10

## 2013-10-22 MED ORDER — OXYCODONE HCL 5 MG/5ML PO SOLN
5.0000 mg | Freq: Once | ORAL | Status: DC | PRN
  Filled 2013-10-22: qty 5

## 2013-10-22 MED ORDER — MIDAZOLAM HCL 2 MG/2ML IJ SOLN
INTRAMUSCULAR | Status: AC
  Filled 2013-10-22: qty 2

## 2013-10-22 MED ORDER — LACTATED RINGERS IV SOLN
INTRAVENOUS | Status: DC
  Administered 2013-10-22 (×2): via INTRAVENOUS
  Filled 2013-10-22: qty 1000

## 2013-10-22 MED ORDER — STERILE WATER FOR IRRIGATION IR SOLN
Status: DC | PRN
  Administered 2013-10-22: 1000 mL

## 2013-10-22 MED ORDER — MEPERIDINE HCL 25 MG/ML IJ SOLN
6.2500 mg | INTRAMUSCULAR | Status: DC | PRN
  Filled 2013-10-22: qty 1

## 2013-10-22 MED ORDER — HYDROMORPHONE HCL PF 1 MG/ML IJ SOLN
0.2500 mg | INTRAMUSCULAR | Status: DC | PRN
  Filled 2013-10-22: qty 1

## 2013-10-22 MED ORDER — FENTANYL CITRATE 0.05 MG/ML IJ SOLN
INTRAMUSCULAR | Status: DC | PRN
  Administered 2013-10-22: 75 ug via INTRAVENOUS
  Administered 2013-10-22: 50 ug via INTRAVENOUS
  Administered 2013-10-22: 25 ug via INTRAVENOUS
  Administered 2013-10-22: 100 ug via INTRAVENOUS

## 2013-10-22 MED ORDER — 0.9 % SODIUM CHLORIDE (POUR BTL) OPTIME
TOPICAL | Status: DC | PRN
  Administered 2013-10-22: 4000 mL

## 2013-10-22 MED ORDER — KCL IN DEXTROSE-NACL 20-5-0.45 MEQ/L-%-% IV SOLN
INTRAVENOUS | Status: DC
Start: 2013-10-22 — End: 2013-10-23
  Filled 2013-10-22: qty 1000

## 2013-10-22 MED ORDER — KETOROLAC TROMETHAMINE 30 MG/ML IJ SOLN
INTRAMUSCULAR | Status: DC | PRN
  Administered 2013-10-22: 30 mg via INTRAVENOUS

## 2013-10-22 MED ORDER — ACETAMINOPHEN 500 MG PO TABS
1000.0000 mg | ORAL_TABLET | Freq: Four times a day (QID) | ORAL | Status: DC
  Administered 2013-10-23 (×2): 1000 mg via ORAL
  Filled 2013-10-22 (×4): qty 2

## 2013-10-22 MED ORDER — MENTHOL 3 MG MT LOZG
1.0000 | LOZENGE | OROMUCOSAL | Status: DC | PRN
  Filled 2013-10-22: qty 9

## 2013-10-22 MED ORDER — GENTAMICIN IN SALINE 1.6-0.9 MG/ML-% IV SOLN
80.0000 mg | INTRAVENOUS | Status: DC
  Filled 2013-10-22: qty 50

## 2013-10-22 MED ORDER — FENTANYL CITRATE 0.05 MG/ML IJ SOLN
INTRAMUSCULAR | Status: AC
  Filled 2013-10-22: qty 2

## 2013-10-22 MED ORDER — LIDOCAINE HCL (CARDIAC) 20 MG/ML IV SOLN
INTRAVENOUS | Status: DC | PRN
  Administered 2013-10-22: 20 mg via INTRAVENOUS

## 2013-10-22 MED ORDER — PROPOFOL 10 MG/ML IV BOLUS
INTRAVENOUS | Status: DC | PRN
Start: 2013-10-22 — End: 2013-10-22
  Administered 2013-10-22: 50 mg via INTRAVENOUS
  Administered 2013-10-22: 150 mg via INTRAVENOUS

## 2013-10-22 MED ORDER — PHENOL 1.4 % MT LIQD
1.0000 | OROMUCOSAL | Status: DC | PRN
  Filled 2013-10-22: qty 177

## 2013-10-22 MED ORDER — ONDANSETRON HCL 4 MG/2ML IJ SOLN
4.0000 mg | INTRAMUSCULAR | Status: DC | PRN
  Filled 2013-10-22: qty 2

## 2013-10-22 MED ORDER — PROMETHAZINE HCL 25 MG/ML IJ SOLN
6.2500 mg | INTRAMUSCULAR | Status: DC | PRN
  Filled 2013-10-22: qty 1

## 2013-10-22 MED ORDER — ONDANSETRON HCL 4 MG/2ML IJ SOLN
INTRAMUSCULAR | Status: DC | PRN
  Administered 2013-10-22: 4 mg via INTRAVENOUS

## 2013-10-22 MED ORDER — NEOSTIGMINE METHYLSULFATE 10 MG/10ML IV SOLN
INTRAVENOUS | Status: DC | PRN
  Administered 2013-10-22: 3 mg via INTRAVENOUS

## 2013-10-22 MED ORDER — FENTANYL CITRATE 0.05 MG/ML IJ SOLN
INTRAMUSCULAR | Status: AC
  Filled 2013-10-22: qty 4

## 2013-10-22 MED ORDER — DOCUSATE SODIUM 100 MG PO CAPS
100.0000 mg | ORAL_CAPSULE | Freq: Two times a day (BID) | ORAL | Status: DC
  Administered 2013-10-22: 100 mg via ORAL
  Filled 2013-10-22 (×3): qty 1

## 2013-10-22 MED ORDER — DEXAMETHASONE SODIUM PHOSPHATE 4 MG/ML IJ SOLN
INTRAMUSCULAR | Status: DC | PRN
  Administered 2013-10-22: 10 mg via INTRAVENOUS

## 2013-10-22 MED ORDER — SENNA 8.6 MG PO TABS
1.0000 | ORAL_TABLET | Freq: Two times a day (BID) | ORAL | Status: DC
  Administered 2013-10-22: 8.6 mg via ORAL
  Filled 2013-10-22 (×2): qty 1

## 2013-10-22 MED ORDER — OXYCODONE HCL 5 MG PO TABS
5.0000 mg | ORAL_TABLET | Freq: Once | ORAL | Status: DC | PRN
  Filled 2013-10-22: qty 1

## 2013-10-22 MED ORDER — SUCCINYLCHOLINE CHLORIDE 20 MG/ML IJ SOLN
INTRAMUSCULAR | Status: DC | PRN
  Administered 2013-10-22: 100 mg via INTRAVENOUS

## 2013-10-22 MED ORDER — GLYCOPYRROLATE 0.2 MG/ML IJ SOLN
INTRAMUSCULAR | Status: DC | PRN
  Administered 2013-10-22: 0.4 mg via INTRAVENOUS

## 2013-10-22 MED ORDER — SODIUM CHLORIDE 0.9 % IR SOLN
Status: DC | PRN
  Administered 2013-10-22: 48000 mL

## 2013-10-22 SURGICAL SUPPLY — 40 items
ADAPTER IRRIG TUBE 2 SPIKE SOL (ADAPTER) ×6 IMPLANT
BAG URINE DRAINAGE (UROLOGICAL SUPPLIES) ×3 IMPLANT
BAG URINE LEG 19OZ MD ST LTX (BAG) IMPLANT
BAG URINE LEG 500ML (DRAIN) IMPLANT
BAG URO CATCHER STRL LF (DRAPE) ×3 IMPLANT
BASKET LASER NITINOL 1.9FR (BASKET) IMPLANT
BASKET ZERO TIP NITINOL 2.4FR (BASKET) IMPLANT
CANISTER SUCT LVC 12 LTR MEDI- (MISCELLANEOUS) ×15 IMPLANT
CATH FOLEY 2WAY SLVR  5CC 22FR (CATHETERS)
CATH FOLEY 2WAY SLVR 30CC 20FR (CATHETERS) IMPLANT
CATH FOLEY 2WAY SLVR 30CC 24FR (CATHETERS) ×3 IMPLANT
CATH FOLEY 2WAY SLVR 5CC 22FR (CATHETERS) IMPLANT
CATH INTERMIT  6FR 70CM (CATHETERS) ×3 IMPLANT
CLOTH BEACON ORANGE TIMEOUT ST (SAFETY) ×3 IMPLANT
DRAPE CAMERA CLOSED 9X96 (DRAPES) ×3 IMPLANT
ELECT LOOP MED HF 24F 12D CBL (CLIP) IMPLANT
ELECT REM PT RETURN 9FT ADLT (ELECTROSURGICAL)
ELECT RESECT VAPORIZE 12D CBL (ELECTRODE) IMPLANT
ELECTRODE REM PT RTRN 9FT ADLT (ELECTROSURGICAL) IMPLANT
EVACUATOR MICROVAS BLADDER (UROLOGICAL SUPPLIES) IMPLANT
GLOVE BIO SURGEON STRL SZ7.5 (GLOVE) ×3 IMPLANT
GLOVE BIOGEL M 6.5 STRL (GLOVE) ×3 IMPLANT
GLOVE BIOGEL PI IND STRL 6.5 (GLOVE) ×2 IMPLANT
GLOVE BIOGEL PI INDICATOR 6.5 (GLOVE) ×1
GLOVE SURG SS PI 7.5 STRL IVOR (GLOVE) ×3 IMPLANT
GOWN PREVENTION PLUS XLARGE (GOWN DISPOSABLE) IMPLANT
GOWN STRL NON-REIN LRG LVL3 (GOWN DISPOSABLE) IMPLANT
GOWN STRL REUS W/TWL LRG LVL3 (GOWN DISPOSABLE) ×3 IMPLANT
GOWN STRL REUS W/TWL XL LVL3 (GOWN DISPOSABLE) ×6 IMPLANT
GUIDEWIRE ANG ZIPWIRE 038X150 (WIRE) IMPLANT
GUIDEWIRE STR DUAL SENSOR (WIRE) ×3 IMPLANT
HOLDER FOLEY CATH W/STRAP (MISCELLANEOUS) ×3 IMPLANT
IV NS IRRIG 3000ML ARTHROMATIC (IV SOLUTION) ×48 IMPLANT
NS IRRIG 1000ML POUR BTL (IV SOLUTION) ×12 IMPLANT
PACK CYSTOSCOPY (CUSTOM PROCEDURE TRAY) ×3 IMPLANT
SET ASPIRATION TUBING (TUBING) IMPLANT
SYRINGE 10CC LL (SYRINGE) ×3 IMPLANT
SYRINGE 35CC LL (MISCELLANEOUS) ×3 IMPLANT
SYRINGE IRR TOOMEY STRL 70CC (SYRINGE) IMPLANT
TUBE FEEDING 8FR 16IN STR KANG (MISCELLANEOUS) ×3 IMPLANT

## 2013-10-22 NOTE — H&P (Signed)
[name] is an 68 y.o. female.    Chief Complaint: Pre-Op Transurethral Resection of Very Large Bladder Cancer  HPI:   1 - Bladder Cancer - Large volume multifocal bladder cancer by CT and cysto 2015 on eval for persistant dysuria and hematuria. Large trigone and left wall components. No hydro or pelvic adenopathy by CT Urogram. No tissue diagnosis yet.   2 - Dysuria / Chronic Cystitis - Long h/o irritative voiding symptoms and some CX-positive cystitis episodes. Most recent UCX Klebsiella sens FQ, augmentin, bactrim.  3 - Non-Complex Rt Renal Cyst - incidental <2cm non-complex rt cyst on CT Urogram 09/2013.  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  PMH sig for TNA, foot surgery. NO CV disease. NO strong blood thinners. NO chest or abdominal surgeries. She gets primary care through [facility] [address] is seen to proceed with TURBT, retrogrades, possible ureteral stenting for initial diagnosis and therapy for her large volume bladder cancer. Most recent UCX klebsiella again and has been on CX-specific cipro pre-op to reduce bacterial load, no fevers to suggest true infection.  Past Medical History  Diagnosis Date  . Bladder cancer   . Wears contact lenses   . History of recurrent UTIs     Past Surgical History  Procedure Laterality Date  . Bunionectomy Right 2000  . Tonsillectomy  55   (age 12)  . Benign breast bx Right 1995  . Colonoscopy w/ polypectomy  2014    History reviewed. No pertinent family history. Social History:  reports that she quit smoking about a year ago. Her smoking use included Cigarettes. She has a 3 pack-year smoking history. She has never used smokeless tobacco. She reports that she drinks alcohol. She reports that she does not use illicit drugs.  Allergies:  Allergies  Allergen Reactions  . Hycodan  [Hydrocodone-Homatropine] Hives    No prescriptions prior to admission    No results found for this or any previous visit (from the past 48 hour(s)). No results found.  Review of Systems  Constitutional: Negative.  Negative for fever and chills.  HENT: Negative.   Eyes: Negative.   Respiratory: Negative.   Cardiovascular: Negative.   Gastrointestinal: Negative.   Genitourinary: Positive for dysuria and hematuria.  Musculoskeletal: Negative.   Skin: Negative.   Neurological: Negative.   Endo/Heme/Allergies: Negative.   Psychiatric/Behavioral: Negative.     Height 5\' 11"  (1.803 m), weight 90.719 kg (200 lb). Physical Exam  Constitutional: She is oriented to person, place, and time. She appears well-developed and well-nourished.  HENT:  Head: Normocephalic and atraumatic.  Eyes: EOM are normal. Pupils are equal, round, and reactive to light.  Neck: Normal range of motion. Neck supple.  Cardiovascular: Normal rate and regular rhythm.   Respiratory: Effort normal and breath sounds normal.  GI: Soft. Bowel sounds are normal.  Genitourinary:  No CVAT  Neurological: She is alert and oriented to person, place, and time.  Skin: Skin is warm and dry.  Psychiatric: She has a normal mood and affect. Her behavior is normal. Judgment and thought content normal.     Assessment/Plan  1 - Bladder Cancer - Needs transurethral resection for tissue diagnosis, staging, and initial therapy. Given size and large lateral wall component, prefer complete paralysis / ET Tube. May need post-op foley as well.   We rediscussed operative biopsy / transurethral resection  as the best next step for diagnostic and therapeutic purposes with goals being to remove all visible cancer and obtain tissue for pathologic exam. We rediscussed that for some low-grade tumors, this may be all the treatment required, but that for many other tumors such as high-grade lesions, further therapy including surgery and or  chemotherapy may be warranted. We also reoutlined the fact that any bladder cancer diagnosis will require close follow-up with periodic upper and lower tract evaluation. We rediscussed risks including bleeding, infection, damage to kidney / ureter / bladder including bladder perforation which can typically managed with prolonged foley catheterization. We rementioned anesthetic and other rare risks including DVT, PE, MI, and mortality. I also mentioned that adjunctive procedures such as ureteral stenting, retrograde pyelography, and ureteroscopy may be necessary to fully evaluate the urinary tract depending on intra-operative findings. After answering all questions to the patient's satisfaction, they wish to proceed today as planned.   2 - Dysuria / Chronic Cystitis - Likely multifactorial from occasional cystitis as well as large volume bladder cancer.   3 - Non-Complex Rt Renal Cyst - no specific surveillance warranted.   4 - Rt Duplex Kidney - no hydro to suggest functional abnormality. May impact bladder cancer treatment if needs future cystectomy as would require separate ureteral anastamoses.   [name] 10/22/2013, 6:25 AM

## 2013-10-22 NOTE — Transfer of Care (Signed)
Immediate Anesthesia Transfer of Care Note  Patient: [name]  Procedure(s) Performed: Procedure(s): TRANSURETHRAL RESECTION OF BLADDER TUMOR WITH ERBE. (N/A) CYSTOSCOPY WITH RETROGRADE PYELOGRAM (Bilateral)  Patient Location: PACU  Anesthesia Type:General  Level of Consciousness: awake, alert , oriented and patient cooperative  Airway & Oxygen Therapy: Patient Spontanous Breathing and Patient connected to nasal cannula oxygen  Post-op Assessment: Report given to PACU RN and Post -op Vital signs reviewed and stable  Post vital signs: Reviewed and stable  Complications: No apparent anesthesia complications

## 2013-10-22 NOTE — OR Nursing (Signed)
Family notified about the procedure is going to take aproximatelly 30 or 45 minutes more. Patient is doing ok.

## 2013-10-22 NOTE — Anesthesia Preprocedure Evaluation (Addendum)
Anesthesia Evaluation  Patient identified by MRN, date of birth, ID band Patient awake    Reviewed: Allergy & Precautions, H&P , NPO status , Patient's Chart, lab work & pertinent test results  Airway Mallampati: II TM Distance: >3 FB Neck ROM: Full    Dental  (+) Dental Advisory Given, Teeth Intact, Caps,    Pulmonary former smoker,  breath sounds clear to auscultation- rhonchi        Cardiovascular Exercise Tolerance: Good negative cardio ROS  Rhythm:Regular Rate:Normal     Neuro/Psych negative neurological ROS  negative psych ROS   GI/Hepatic negative GI ROS, Neg liver ROS,   Endo/Other  negative endocrine ROS  Renal/GU negative Renal ROSBladder Tumor     Musculoskeletal negative musculoskeletal ROS (+)   Abdominal   Peds  Hematology negative hematology ROS (+)   Anesthesia Other Findings   Reproductive/Obstetrics negative OB ROS                      Anesthesia Physical Anesthesia Plan  ASA: I  Anesthesia Plan: General   Post-op Pain Management:    Induction: Intravenous  Airway Management Planned: Oral ETT  Additional Equipment:   Intra-op Plan:   Post-operative Plan: Extubation in OR  Informed Consent: I have reviewed the patients History and Physical, chart, labs and discussed the procedure including the risks, benefits and alternatives for the proposed anesthesia with the patient or authorized representative who has indicated his/her understanding and acceptance.   Dental advisory given  Plan Discussed with: CRNA  Anesthesia Plan Comments:        Anesthesia Quick Evaluation

## 2013-10-22 NOTE — Anesthesia Postprocedure Evaluation (Signed)
Anesthesia Post Note  Patient: [name]  Procedure(s) Performed: Procedure(s) (LRB): TRANSURETHRAL RESECTION OF BLADDER TUMOR WITH ERBE. (N/A) CYSTOSCOPY WITH RETROGRADE PYELOGRAM (Bilateral)  Anesthesia type: General  Patient location: PACU  Post pain: Pain level controlled  Post assessment: Post-op Vital signs reviewed  Last Vitals:  Filed Vitals:   10/22/13 1750  BP: 148/78  Pulse: 78  Temp: 36.5 C  Resp: 16    Post vital signs: Reviewed  Level of consciousness: sedated  Complications: No apparent anesthesia complications

## 2013-10-22 NOTE — Brief Op Note (Signed)
10/22/2013  4:14 PM  PATIENT:  Julian Hy  68 y.o. female  PRE-OPERATIVE DIAGNOSIS:  BLADDER CANCER  POST-OPERATIVE DIAGNOSIS:  BLADDER CANCER  PROCEDURE:  Procedure(s): TRANSURETHRAL RESECTION OF BLADDER TUMOR WITH ERBE. (N/A) CYSTOSCOPY WITH RETROGRADE PYELOGRAM (Bilateral)  SURGEON:  Surgeon(s) and Role:    * Alexis Frock, MD - Primary  PHYSICIAN ASSISTANT:   ASSISTANTS: none   ANESTHESIA:   general  EBL:  Total I/O In: 1600 [I.V.:1600] Out: -   BLOOD ADMINISTERED:none  DRAINS: 86F foley to NS irrigation, efflux light pink   LOCAL MEDICATIONS USED:  NONE  SPECIMEN:  Source of Specimen:  1 - massive bladder tumor, 2- base of bladder tumor  DISPOSITION OF SPECIMEN:  PATHOLOGY  COUNTS:  YES  TOURNIQUET:  * No tourniquets in log *  DICTATION: .Other Dictation: Dictation Number [id number]  PLAN OF CARE: Admit for overnight observation  PATIENT DISPOSITION:  PACU - hemodynamically stable.   Delay start of Pharmacological VTE agent (>24hrs) due to surgical blood loss or risk of bleeding: yes

## 2013-10-22 NOTE — Anesthesia Procedure Notes (Signed)
Procedure Name: Intubation Date/Time: 10/22/2013 1:25 PM Performed by: Wanita Chamberlain Pre-anesthesia Checklist: Patient identified, Timeout performed, Emergency Drugs available, Suction available and Patient being monitored Patient Re-evaluated:Patient Re-evaluated prior to inductionOxygen Delivery Method: Circle system utilized Preoxygenation: Pre-oxygenation with 100% oxygen Intubation Type: IV induction Ventilation: Mask ventilation without difficulty Laryngoscope Size: Mac and 3 Grade View: Grade II Tube type: Oral Tube size: 7.0 mm Number of attempts: 1 Airway Equipment and Method: Bite block and Stylet Placement Confirmation: ETT inserted through vocal cords under direct vision,  positive ETCO2 and breath sounds checked- equal and bilateral (Pharynx and soft tissue around v. cords inflammed) Secured at: 22 cm Tube secured with: Tape Dental Injury: Teeth and Oropharynx as per pre-operative assessment

## 2013-10-23 ENCOUNTER — Encounter (HOSPITAL_BASED_OUTPATIENT_CLINIC_OR_DEPARTMENT_OTHER): Payer: Self-pay | Admitting: Urology

## 2013-10-23 LAB — BASIC METABOLIC PANEL
ANION GAP: 14 (ref 5–15)
BUN: 15 mg/dL (ref 6–23)
CALCIUM: 9.3 mg/dL (ref 8.4–10.5)
CO2: 22 meq/L (ref 19–32)
CREATININE: 0.73 mg/dL (ref 0.50–1.10)
Chloride: 103 mEq/L (ref 96–112)
GFR calc Af Amer: >90 mL/min (ref >90)
GFR calc non Af Amer: 87 mL/min — ABNORMAL LOW (ref >90)
Glucose, Bld: 116 mg/dL — ABNORMAL HIGH (ref 70–99)
Potassium: 4.5 mEq/L (ref 3.7–5.3)
Sodium: 139 mEq/L (ref 137–147)

## 2013-10-23 LAB — HEMOGLOBIN AND HEMATOCRIT, BLOOD
HCT: 36.5 % (ref 36.0–46.0)
Hemoglobin: 12.4 g/dL (ref 12.0–15.0)

## 2013-10-23 MED ORDER — TRAMADOL HCL 50 MG PO TABS: 50.0000 mg | ORAL_TABLET | Freq: Four times a day (QID) | ORAL | Status: DC | PRN

## 2013-10-23 MED ORDER — SENNOSIDES-DOCUSATE SODIUM 8.6-50 MG PO TABS: 1.0000 | ORAL_TABLET | Freq: Two times a day (BID) | ORAL | Status: DC

## 2013-10-23 MED ORDER — SULFAMETHOXAZOLE-TMP DS 800-160 MG PO TABS: 1.0000 | ORAL_TABLET | Freq: Two times a day (BID) | ORAL | Status: DC

## 2013-10-23 NOTE — Discharge Summary (Signed)
Physician Discharge Summary  Patient ID: [name] MRN: [id number] DOB/AGE: 1945/07/18 68 y.o.  Admit date: 10/22/2013 Discharge date: 10/23/2013  Admission Diagnoses: Massive Bladder Cancer  Discharge Diagnoses:  Active Problems:   Bladder cancer   Discharged Condition: good  Hospital Course:   1 - Bladder Cancer - pt underwent transurethral resection of massive bladder cancer 10/22/2013. Resection took 2.5 hrs therefore observed overnight. By POD1, the day of discharge, pt ambulatory, pain controlled, good urine output via catheter w/o clots, Hgb and Cr acceptable,and felt adequate for discharge with foley in place, to be removed in office in several days.  Consults: None  Significant Diagnostic Studies: labs: Cr <1. Hgb >10, surgical pathology - pending  Treatments: surgery:  transurethral resection of massive bladder cancer 10/22/2013  Discharge Exam: Blood pressure 122/64, pulse 81, temperature 97.5 F (36.4 C), temperature source Oral, resp. rate 18, height 5\' 11"  (1.803 m), weight 91.627 kg (202 lb), SpO2 95.00%. General appearance: alert, cooperative and appears stated age Head: Normocephalic, without obvious abnormality, atraumatic Throat: lips, mucosa, and tongue normal; teeth and gums normal Neck: supple, symmetrical, trachea midline Back: symmetric, no curvature. ROM normal. No CVA tenderness. Resp: non-labored Chest wall: no tenderness Cardio: Nl rate GI: soft, non-tender; bowel sounds normal; no masses,  no organomegaly Pelvic: external genitalia normal and foley c/d/i with light pink urine in foley OFFirrigation Extremities: extremities normal, atraumatic, no cyanosis or edema Pulses: 2+ and symmetric Skin: Skin color, texture, turgor normal. No rashes or lesions Lymph nodes: Cervical, supraclavicular, and axillary nodes normal. Neurologic: Grossly normal Incision/Wound: n/a  Disposition:      Medication List         ciprofloxacin 500 MG  tablet  Commonly known as:  CIPRO  Take 500 mg by mouth 2 (two) times daily. Pre procedure medication     senna-docusate 8.6-50 MG per tablet  Commonly known as:  Senokot-S  Take 1 tablet by mouth 2 (two) times daily. While taking pain meds to prevent constipation.     sulfamethoxazole-trimethoprim 800-160 MG per tablet  Commonly known as:  BACTRIM DS  Take 1 tablet by mouth 2 (two) times daily. X 5 days to prevent post-op infection     traMADol 50 MG tablet  Commonly known as:  ULTRAM  Take 1-2 tablets (50-100 mg total) by mouth every 6 (six) hours as needed for moderate pain or severe pain. Post-operatively           Follow-up Information   Follow up with Alexis Frock, MD On 11/06/2013. (at 10 AM for MD visit. We will also arrange for office trial of void and catheter removal on Monday morning.)    Specialty:  Urology   Contact information:   [place] [place] [phone]       Signed: Alexis Frock 10/23/2013, 7:40 AM

## 2013-10-23 NOTE — Progress Notes (Signed)
Utilization review completed.  

## 2013-10-23 NOTE — Discharge Instructions (Signed)
1 - You may have urinary urgency (bladder spasms) and bloody urine on / off x 1-2 weeks. This is normal. ° °2 - Call MD or go to ER for fever >102, severe pain / nausea / vomiting not relieved by medications, or acute change in medical status ° °

## 2013-10-23 NOTE — Op Note (Signed)
[name], Barton NO.:  [id number]  MEDICAL RECORD NO.:  [id number]  LOCATION:  7425                         FACILITY:  [facility]  PHYSICIAN:  Alexis Frock, MD     DATE OF BIRTH:  08-02-1945  DATE OF PROCEDURE:  10/22/2013 DATE OF DISCHARGE:                              OPERATIVE REPORT   DIAGNOSIS:  Massive volume bladder cancer.  PROCEDURE: 1. Transection of bladder tumor volume large. 2. Bilateral retrograde pyelogram with interpretation.  ESTIMATED BLOOD LOSS:  100 mL.  COMPLICATIONS:  None.  SPECIMEN: 1. Bladder tumor. 2. Base of bladder tumor.  FINDINGS: 1. Massive volume multifocal bladder tumor, most concentrated on left     lateral wall, lateral to the left ureteral orifice. 2. Unremarkable bilateral retrograde pyelograms. 3. Sparing of ureteral orifices, post resection. 4. No evidence of bladder perforation following resection of all     bladder tumors flush to the bladder wall. 5. Very broad-based tumor, the dominant lesion on the left.  Visually     very concerning for residual tumor following resection.  24-French 3 Foley catheter, normal saline irrigation, effluxed light pink.  INDICATION:  [name] is an unfortunate 68 year old lady who was found, on workup, gross hematuria to have a massive volume bladder cancer.  She underwent imaging with CT that revealed no evidence of disease, however, massive volume of bladder cancer.  Options were discussed for initial management including transection of bladder tumor for diagnostic and staging purposes as well as treatment, and she wished to proceed.  Informed consent was obtained and placed in medical record.  PROCEDURE IN DETAIL:  The patient being [name] verified, procedure being transurethral resection of bladder tumor confirmed. Procedure was carried out.  Time-out was performed.  Intravenous antibiotics were administered.  General endotracheal anesthesia  was introduced.  The patient was placed into a low lithotomy position. Sterile field was created prepping and draping the patient's vagina, introitus, and proximal thighs using iodine x3.  Next, cystourethroscopy was performed using a 22-French rigid cystoscope with 30-degree offset lens.  Inspection of bladder revealed massive bladder cancer in all 4 quadrants.  This was present at the dome, trigone, right wall, left wall.  Most tumor volume was at the left wall lateral to the left ureteral orifice.  Bilateral ureteral orifices were not directly involved by this process.  Attention was then directed to bilateral retrograde pyelogram.  First the right ureteral orifice was cannulated with a 6-French end-hole catheter and right retrograde pyelogram was obtained.  Right retrograde pyelogram demonstrated a single right ureter, single system right kidney.  No filling defects or narrowing noted.  Similarly left retrograde pyelogram was obtained.  Left retrograde pyelogram demonstrated a single left ureter, single system left kidney.  No filling defects or narrowing noted.  Next, the cystoscope was exchanged for a 26-French wolf continuous-flow resectoscope sheath with a 2.5 mm loop.  Very careful systematic resection was performed of all lesions taken them, flushed the bladder wall beginning at the 6 o'clock position in a clockwise fashion towards the 6 o'clock position again.  This inherently dealt with the smaller tumors first leaving the larger dominant lesion for the end of the resection.  The  massive amount of bladder tumor fragments were then set aside for permanent pathology, labeled as bladder tumor.  Next, cold cup biopsies were obtained from the base of the dominant lesion lateral to the left ureteral orifice.  These were set aside, labeled as base of bladder tumor.  Additional coagulation current was applied where needed and hemostasis was then appeared excellent.  Following these  maneuvers, there was no visual evidence of bladder perforation.  Hemostasis was good.  Given the prolonged nature of the case and massive service of the bladder, it was felt that observation overnight with Foley catheter on gentle irrigation would be warranted.  As such, a new 24-French 3 Foley catheter was placed, 20 mL sterile water in the balloon, normal saline irrigation 1 drop per second, efflux light pink.  Procedure was then terminated.  The patient tolerated the procedure well.  There were no immediate periprocedural complications.  The patient was taken to postanesthesia care unit in stable condition.          ______________________________ Alexis Frock, MD     TM/MEDQ  D:  10/22/2013  T:  10/23/2013  Job:  [id number]

## 2013-11-12 ENCOUNTER — Other Ambulatory Visit: Payer: Self-pay | Admitting: Urology

## 2013-12-03 ENCOUNTER — Encounter (HOSPITAL_BASED_OUTPATIENT_CLINIC_OR_DEPARTMENT_OTHER): Payer: Self-pay | Admitting: *Deleted

## 2013-12-03 NOTE — Progress Notes (Signed)
NPO AFTER MN. ARRIVE AT 1000. NEEDS ISTAT 8.

## 2013-12-10 ENCOUNTER — Encounter (HOSPITAL_BASED_OUTPATIENT_CLINIC_OR_DEPARTMENT_OTHER): Payer: Self-pay | Admitting: *Deleted

## 2013-12-10 ENCOUNTER — Encounter (HOSPITAL_BASED_OUTPATIENT_CLINIC_OR_DEPARTMENT_OTHER)
Admission: RE | Disposition: A | Discharge status: Home / Self Care | Payer: Self-pay | Source: Ambulatory Visit | Attending: Urology

## 2013-12-10 ENCOUNTER — Ambulatory Visit (HOSPITAL_BASED_OUTPATIENT_CLINIC_OR_DEPARTMENT_OTHER): Payer: Medicare Other | Admitting: Anesthesiology

## 2013-12-10 ENCOUNTER — Ambulatory Visit (HOSPITAL_BASED_OUTPATIENT_CLINIC_OR_DEPARTMENT_OTHER)
Admission: RE | Admit: 2013-12-10 | Discharge: 2013-12-10 | Disposition: A | Discharge status: Home / Self Care | Payer: Medicare Other | Source: Ambulatory Visit | Attending: Urology | Admitting: Urology

## 2013-12-10 ENCOUNTER — Encounter (HOSPITAL_BASED_OUTPATIENT_CLINIC_OR_DEPARTMENT_OTHER): Payer: Medicare Other | Admitting: Anesthesiology

## 2013-12-10 DIAGNOSIS — C679 Malignant neoplasm of bladder, unspecified: Secondary | ICD-10-CM | POA: Diagnosis not present

## 2013-12-10 DIAGNOSIS — Z87891 Personal history of nicotine dependence: Secondary | ICD-10-CM | POA: Insufficient documentation

## 2013-12-10 HISTORY — PX: TRANSURETHRAL RESECTION OF BLADDER TUMOR WITH GYRUS (TURBT-GYRUS): SHX6458

## 2013-12-10 HISTORY — PX: CYSTOSCOPY W/ RETROGRADES: SHX1426

## 2013-12-10 LAB — POCT I-STAT, CHEM 8
BUN: 21 mg/dL (ref 6–23)
Calcium, Ion: 1.17 mmol/L (ref 1.13–1.30)
Chloride: 106 mEq/L (ref 96–112)
Creatinine, Ser: 0.7 mg/dL (ref 0.50–1.10)
Glucose, Bld: 98 mg/dL (ref 70–99)
HEMATOCRIT: 39 % (ref 36.0–46.0)
HEMOGLOBIN: 13.3 g/dL (ref 12.0–15.0)
Potassium: 4.4 mEq/L (ref 3.7–5.3)
SODIUM: 140 meq/L (ref 137–147)
TCO2: 23 mmol/L (ref 0–100)

## 2013-12-10 SURGERY — TRANSURETHRAL RESECTION OF BLADDER TUMOR WITH GYRUS (TURBT-GYRUS)
Anesthesia: General | Site: Ureter

## 2013-12-10 MED ORDER — FENTANYL CITRATE 0.05 MG/ML IJ SOLN
INTRAMUSCULAR | Status: DC | PRN
  Administered 2013-12-10: 25 ug via INTRAVENOUS
  Administered 2013-12-10: 50 ug via INTRAVENOUS
  Administered 2013-12-10: 25 ug via INTRAVENOUS

## 2013-12-10 MED ORDER — FENTANYL CITRATE 0.05 MG/ML IJ SOLN
INTRAMUSCULAR | Status: AC
  Filled 2013-12-10: qty 6

## 2013-12-10 MED ORDER — PROMETHAZINE HCL 25 MG/ML IJ SOLN
6.2500 mg | INTRAMUSCULAR | Status: DC | PRN
  Filled 2013-12-10: qty 1

## 2013-12-10 MED ORDER — TRAMADOL HCL 50 MG PO TABS: 50.0000 mg | ORAL_TABLET | Freq: Four times a day (QID) | ORAL | Status: DC | PRN

## 2013-12-10 MED ORDER — PROPOFOL 10 MG/ML IV BOLUS
INTRAVENOUS | Status: DC | PRN
  Administered 2013-12-10: 180 mg via INTRAVENOUS

## 2013-12-10 MED ORDER — TRAMADOL HCL 50 MG PO TABS
ORAL_TABLET | ORAL | Status: AC
  Filled 2013-12-10: qty 1

## 2013-12-10 MED ORDER — TRAMADOL HCL 50 MG PO TABS
50.0000 mg | ORAL_TABLET | Freq: Once | ORAL | Status: AC
  Administered 2013-12-10: 50 mg via ORAL
  Filled 2013-12-10: qty 1

## 2013-12-10 MED ORDER — MIDAZOLAM HCL 5 MG/5ML IJ SOLN
INTRAMUSCULAR | Status: DC | PRN
  Administered 2013-12-10: 2 mg via INTRAVENOUS

## 2013-12-10 MED ORDER — IOHEXOL 350 MG/ML SOLN
INTRAVENOUS | Status: DC | PRN
  Administered 2013-12-10: 30 mL

## 2013-12-10 MED ORDER — SODIUM CHLORIDE 0.9 % IR SOLN
Status: DC | PRN
  Administered 2013-12-10: 9000 mL

## 2013-12-10 MED ORDER — DEXAMETHASONE SODIUM PHOSPHATE 4 MG/ML IJ SOLN
INTRAMUSCULAR | Status: DC | PRN
  Administered 2013-12-10: 8 mg via INTRAVENOUS

## 2013-12-10 MED ORDER — GENTAMICIN SULFATE 40 MG/ML IJ SOLN
5.0000 mg/kg | Freq: Once | INTRAVENOUS | Status: AC
  Administered 2013-12-10: 440 mg via INTRAVENOUS
  Filled 2013-12-10: qty 11

## 2013-12-10 MED ORDER — KETOROLAC TROMETHAMINE 30 MG/ML IJ SOLN
INTRAMUSCULAR | Status: DC | PRN
  Administered 2013-12-10: 30 mg via INTRAVENOUS

## 2013-12-10 MED ORDER — LACTATED RINGERS IV SOLN
INTRAVENOUS | Status: DC
  Administered 2013-12-10: 10:00:00 via INTRAVENOUS
  Filled 2013-12-10: qty 1000

## 2013-12-10 MED ORDER — SENNOSIDES-DOCUSATE SODIUM 8.6-50 MG PO TABS: 1.0000 | ORAL_TABLET | Freq: Two times a day (BID) | ORAL | Status: DC

## 2013-12-10 MED ORDER — MIDAZOLAM HCL 2 MG/2ML IJ SOLN
INTRAMUSCULAR | Status: AC
  Filled 2013-12-10: qty 2

## 2013-12-10 MED ORDER — FENTANYL CITRATE 0.05 MG/ML IJ SOLN
25.0000 ug | INTRAMUSCULAR | Status: DC | PRN
  Filled 2013-12-10: qty 1

## 2013-12-10 MED ORDER — ONDANSETRON HCL 4 MG/2ML IJ SOLN
INTRAMUSCULAR | Status: DC | PRN
  Administered 2013-12-10: 4 mg via INTRAVENOUS

## 2013-12-10 MED ORDER — LIDOCAINE HCL (CARDIAC) 20 MG/ML IV SOLN
INTRAVENOUS | Status: DC | PRN
  Administered 2013-12-10: 60 mg via INTRAVENOUS

## 2013-12-10 MED ORDER — GENTAMICIN IN SALINE 1.6-0.9 MG/ML-% IV SOLN
80.0000 mg | INTRAVENOUS | Status: DC
  Filled 2013-12-10: qty 50

## 2013-12-10 SURGICAL SUPPLY — 29 items
BAG URINE DRAINAGE (UROLOGICAL SUPPLIES) IMPLANT
BAG URINE LEG 19OZ MD ST LTX (BAG) IMPLANT
BAG URINE LEG 500ML (DRAIN) IMPLANT
BAG URO CATCHER STRL LF (DRAPE) ×3 IMPLANT
BASKET ZERO TIP NITINOL 2.4FR (BASKET) IMPLANT
CATH FOLEY 2WAY SLVR  5CC 22FR (CATHETERS)
CATH FOLEY 2WAY SLVR 30CC 20FR (CATHETERS) IMPLANT
CATH FOLEY 2WAY SLVR 5CC 22FR (CATHETERS) IMPLANT
CATH INTERMIT  6FR 70CM (CATHETERS) IMPLANT
CLOTH BEACON ORANGE TIMEOUT ST (SAFETY) ×3 IMPLANT
DRAPE CAMERA CLOSED 9X96 (DRAPES) ×3 IMPLANT
ELECT LOOP MED HF 24F 12D CBL (CLIP) ×3 IMPLANT
ELECT REM PT RETURN 9FT ADLT (ELECTROSURGICAL) ×3
ELECT RESECT VAPORIZE 12D CBL (ELECTRODE) IMPLANT
ELECTRODE REM PT RTRN 9FT ADLT (ELECTROSURGICAL) ×2 IMPLANT
EVACUATOR MICROVAS BLADDER (UROLOGICAL SUPPLIES) IMPLANT
GLOVE BIO SURGEON STRL SZ7.5 (GLOVE) ×3 IMPLANT
GLOVE BIOGEL PI IND STRL 7.5 (GLOVE) ×4 IMPLANT
GLOVE BIOGEL PI INDICATOR 7.5 (GLOVE) ×2
GOWN PREVENTION PLUS XLARGE (GOWN DISPOSABLE) IMPLANT
GOWN STRL NON-REIN LRG LVL3 (GOWN DISPOSABLE) IMPLANT
GOWN STRL REUS W/TWL XL LVL3 (GOWN DISPOSABLE) ×6 IMPLANT
GUIDEWIRE ANG ZIPWIRE 038X150 (WIRE) ×3 IMPLANT
GUIDEWIRE STR DUAL SENSOR (WIRE) ×3 IMPLANT
IV NS IRRIG 3000ML ARTHROMATIC (IV SOLUTION) ×9 IMPLANT
NS IRRIG 500ML POUR BTL (IV SOLUTION) ×3 IMPLANT
PACK CYSTOSCOPY (CUSTOM PROCEDURE TRAY) ×3 IMPLANT
SET ASPIRATION TUBING (TUBING) IMPLANT
SYRINGE IRR TOOMEY STRL 70CC (SYRINGE) IMPLANT

## 2013-12-10 NOTE — Anesthesia Postprocedure Evaluation (Signed)
  Anesthesia Post-op Note  Patient: [name]  Procedure(s) Performed: Procedure(s) (LRB): RESTAGING TRANSURETHRAL RESECTION OF BLADDER TUMOR WITH GYRUS (TURBT-GYRUS) (N/A) RETROGRADE PYELOGRAM   (Bilateral)  Patient Location: PACU  Anesthesia Type: general  Level of Consciousness: awake and alert   Airway and Oxygen Therapy: Patient Spontanous Breathing  Post-op Pain: mild  Post-op Assessment: Post-op Vital signs reviewed, Patient's Cardiovascular Status Stable, Respiratory Function Stable, Patent Airway and No signs of Nausea or vomiting  Last Vitals:  Filed Vitals:   12/10/13 1330  BP: 129/77  Pulse: 74  Temp: 36.4 C  Resp: 14    Post-op Vital Signs: stable   Complications: No apparent anesthesia complications

## 2013-12-10 NOTE — Brief Op Note (Signed)
12/10/2013  12:00 PM  PATIENT:  Julian Hy  68 y.o. female  PRE-OPERATIVE DIAGNOSIS:  Bladder Cancer  POST-OPERATIVE DIAGNOSIS:  Bladder Cancer  PROCEDURE:  Procedure(s): RESTAGING TRANSURETHRAL RESECTION OF BLADDER TUMOR WITH GYRUS (TURBT-GYRUS) (N/A) RETROGRADE PYELOGRAM   (Bilateral)  SURGEON:  Surgeon(s) and Role:    * Alexis Frock, MD - Primary  PHYSICIAN ASSISTANT:   ASSISTANTS: none   ANESTHESIA:   general  EBL:  Total I/O In: 200 [I.V.:200] Out: -   BLOOD ADMINISTERED:none  DRAINS: none   LOCAL MEDICATIONS USED:  NONE  SPECIMEN:  Source of Specimen:  1 - old resection site, 2 - base of old resection site, 3 - bladder neck  DISPOSITION OF SPECIMEN:  PATHOLOGY  COUNTS:  YES  TOURNIQUET:  * No tourniquets in log *  DICTATION: .Other Dictation: Dictation Number [id number]  PLAN OF CARE: Discharge to home after PACU  PATIENT DISPOSITION:  PACU - hemodynamically stable.   Delay start of Pharmacological VTE agent (>24hrs) due to surgical blood loss or risk of bleeding: yes

## 2013-12-10 NOTE — Anesthesia Preprocedure Evaluation (Addendum)
Anesthesia Evaluation  Patient identified by MRN, date of birth, ID band Patient awake    Reviewed: Allergy & Precautions, H&P , NPO status , Patient's Chart, lab work & pertinent test results  Airway Mallampati: II TM Distance: >3 FB Neck ROM: Full    Dental no notable dental hx.    Pulmonary former smoker,  breath sounds clear to auscultation  Pulmonary exam normal       Cardiovascular negative cardio ROS  Rhythm:Regular Rate:Normal     Neuro/Psych negative neurological ROS  negative psych ROS   GI/Hepatic negative GI ROS, Neg liver ROS,   Endo/Other  negative endocrine ROS  Renal/GU negative Renal ROS  negative genitourinary   Musculoskeletal negative musculoskeletal ROS (+)   Abdominal   Peds negative pediatric ROS (+)  Hematology negative hematology ROS (+)   Anesthesia Other Findings   Reproductive/Obstetrics negative OB ROS                         Anesthesia Physical Anesthesia Plan  ASA: I  Anesthesia Plan: General   Post-op Pain Management:    Induction: Intravenous  Airway Management Planned: LMA  Additional Equipment:   Intra-op Plan:   Post-operative Plan: Extubation in OR  Informed Consent: I have reviewed the patients History and Physical, chart, labs and discussed the procedure including the risks, benefits and alternatives for the proposed anesthesia with the patient or authorized representative who has indicated his/her understanding and acceptance.   Dental advisory given  Plan Discussed with: CRNA  Anesthesia Plan Comments:         Anesthesia Quick Evaluation

## 2013-12-10 NOTE — Anesthesia Procedure Notes (Signed)
Procedure Name: LMA Insertion Date/Time: 12/10/2013 11:04 AM Performed by: Denna Haggard D Pre-anesthesia Checklist: Patient identified, Emergency Drugs available, Suction available and Patient being monitored Patient Re-evaluated:Patient Re-evaluated prior to inductionOxygen Delivery Method: Circle System Utilized Preoxygenation: Pre-oxygenation with 100% oxygen Intubation Type: IV induction Ventilation: Mask ventilation without difficulty LMA: LMA inserted LMA Size: 4.0 Number of attempts: 1 Airway Equipment and Method: bite block Placement Confirmation: positive ETCO2 Tube secured with: Tape Dental Injury: Teeth and Oropharynx as per pre-operative assessment

## 2013-12-10 NOTE — H&P (Signed)
[name] is an 68 y.o. female.    Chief Complaint: Pre-Op Re-staging Transurethral Resection of Bladder Tumor  HPI:        1 - Bladder Cancer - Massive volume multifocal bladder cancer by CT and cysto 2015 on eval for persistant dysuria and hematuria. Large trigone and left wall components. No hydro or pelvic adenopathy by CT Urogram.   Recent Course: 10/2013 - TaG1 with muscle in specimen by massive volume TURBT  2 - Dysuria / Chronic Cystitis - Long h/o irritative voiding symptoms and some CX-positive cystitis episodes. Most recent UCX Klebsiella sens FQ, augmentin, bactrim.  3 - Non-Complex Rt Renal Cyst - incidental <2cm non-complex rt cyst on CT Urogram 09/2013.  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  PMH sig for TNA, foot surgery. NO CV disease. NO strong blood thinners. NO chest or abdominal surgeries. She gets primary care through [facility] on [address]  Today "[name]" is seen to proceed with re-staging transurethral resection of bladder tumor given her prior very large volume disease. No interval fevers.   Past Medical History  Diagnosis Date  . Bladder cancer   . Wears contact lenses   . History of recurrent UTIs     Past Surgical History  Procedure Laterality Date  . Bunionectomy Right 2000  . Tonsillectomy  46   (age 33)  . Benign breast bx Right 1995  . Colonoscopy w/ polypectomy  2014  . Transurethral resection of bladder tumor with gyrus (turbt-gyrus) N/A 10/22/2013    Procedure: TRANSURETHRAL RESECTION OF BLADDER TUMOR WITH ERBE.;  Surgeon: Alexis Frock, MD;  Location: [facility];  Service: Urology;  Laterality: N/A;  . Cystoscopy w/ ureteral stent placement Bilateral 10/22/2013    Procedure: CYSTOSCOPY WITH RETROGRADE PYELOGRAM;  Surgeon: Alexis Frock, MD;  Location: [facility];  Service: Urology;  Laterality:  Bilateral;    History reviewed. No pertinent family history. Social History:  reports that she quit smoking about 13 months ago. Her smoking use included Cigarettes. She has a 3 pack-year smoking history. She has never used smokeless tobacco. She reports that she drinks alcohol. She reports that she does not use illicit drugs.  Allergies:  Allergies  Allergen Reactions  . Hycodan [Hydrocodone-Homatropine] Hives    No prescriptions prior to admission    No results found for this or any previous visit (from the past 48 hour(s)). No results found.  Review of Systems  Constitutional: Negative.  Negative for fever and chills.  HENT: Negative.   Eyes: Negative.   Respiratory: Negative.   Cardiovascular: Negative.   Gastrointestinal: Negative.  Negative for nausea and vomiting.  Genitourinary: Negative.  Negative for flank pain.  Musculoskeletal: Negative.   Skin: Negative.   Neurological: Negative.   Endo/Heme/Allergies: Negative.   Psychiatric/Behavioral: Negative.     Height 5\' 11"  (1.803 m), weight 90.719 kg (200 lb). Physical Exam  Constitutional: She appears well-developed.  HENT:  Head: Normocephalic and atraumatic.  Eyes: Pupils are equal, round, and reactive to light.  Neck: Normal range of motion. Neck supple.  Cardiovascular: Normal rate.   Respiratory: Effort normal.  GI: Soft.  Genitourinary:  No CVAT  Musculoskeletal: Normal range of motion.  Neurological: She is alert.  Skin: Skin is warm and dry.  Psychiatric: She has a normal mood and affect. Her behavior is normal. Judgment and thought content normal.  Assessment/Plan       1 - Bladder Cancer -  Fortunatley path all low-grade non-invasive which is truly amazing. Given her impressive volume of tumor, I firmly feel that re-staging resection warranted with repeat TURBT +mitomycin in about 1 month to verify resection of all visible tumor and re-confirm low-grade path, then likely surveillance.  Risks  and benefits outlined again as per recent prior surgery.   2 - Dysuria / Chronic Cystitis - improved substantially following removal of large bladder tumor, continue current regimen.    3 - Non-Complex Rt Renal Cyst - no specific surveillance warranted.   4 - Rt Duplex Kidney - no hydro to suggest functional abnormality. May impact bladder cancer treatment if needs future cystectomy as would require separate ureteral anastamoses.  Arbor Leer 12/10/2013, 6:09 AM

## 2013-12-10 NOTE — Discharge Instructions (Signed)
1 - You may have urinary urgency (bladder spasms) and bloody urine on / off x 1-2 weeks. This is normal.  2 - Call MD or go to ER for fever >102, severe pain / nausea / vomiting not relieved by medications, or acute change in medical status.  3 - Dr. Tresa Moore will call you with pathology results when available.     Transurethral Resection, Bladder Tumor A cancerous growth (tumor) can develop on the inside wall of the bladder. The bladder is the organ that holds urine. One way to remove the tumor is a procedure called a transurethral resection. The tumor is removed (resected) through the tube that carries urine from the bladder out of the body (urethra). No cuts (incisions) are made in the skin. Instead, the procedure is done through a thin telescope, called a resectoscope. Attached to it is a light and usually a tiny camera. The resectoscope is put into the urethra. In men, the urethra opens at the end of the penis. In women, it opens just above the vagina.  A transurethral resection is usually used to remove tumors that have not gotten too big or too deep. These are called Stage 0, Stage 1 or Stage 2 bladder cancers. LET YOUR CAREGIVER KNOW ABOUT:  On the day of the procedure, your caregivers will need to know the last time you had anything to eat or drink. This includes water, gum, and candy. In advance, make sure they know about:   Any allergies.  All medications you are taking, including:  Herbs, eyedrops, over-the-counter medications and creams.  Blood thinners (anticoagulants), aspirin or other drugs that could affect blood clotting.  Use of steroids (by mouth or as creams).  Previous problems with anesthetics, including local anesthetics.  Possibility of pregnancy, if this applies.  Any history of blood clots.  Any history of bleeding or other blood problems.  Previous surgery.  Smoking history.  Any recent symptoms of colds or infections.  Other health problems. RISKS AND  COMPLICATIONS This is usually a safe procedure. Every procedure has risks, though. For a transurethral resection, they include:  Infection. Antibiotic medication would need to be taken.  Bleeding.  Light bleeding may last for several days after the procedure.  If bleeding continues or is heavy, the bladder may need rinsing. Or, a new catheter might be put in for awhile.  Sometimes bed rest is needed.  Urination problems.  Pain and burning can occur when urinating. This usually goes away in a few days.  Scarring from the procedure can block the flow of urine.  Bladder damage.  It can be punctured or torn during removal of the tumor. If this happens, a catheter might be needed for longer. Antibiotics would be taken while the bladder heals.  Urine can leak through the hole or tear into the abdomen. If this happens, surgery may be needed to repair the bladder. BEFORE THE PROCEDURE   A medical evaluation will be done. This may include:  A physical examination.  Urine test. This is to make sure you do not have a urinary tract infection.  Blood tests.  A test that checks the heart's rhythm (electrocardiogram).  Talking with an anesthesiologist. This is the person who will be in charge of the medication (anesthesia) to keep you from feeling pain during the transurethral resection. You might be asleep during the procedure (general anesthesia) or numb from the waist down, but awake during the procedure (spinal anesthesia). Ask your surgeon what to expect.  The person who is having a transurethral resection needs to give what is called informed consent. This requires signing a legal paper that gives permission for the procedure. To give informed consent:  You must understand how the procedure is done and why.  You must be told all the risks and benefits of the procedure.  You must sign the consent. Sometimes a legal guardian can do this.  Signing should be witnessed by a healthcare  professional.  The day before the surgery, eat only a light dinner. Then, do not eat or drink anything for at least 8 hours before the surgery. Ask your caregiver if it is OK to take any needed medicines with a sip of water.  Arrive at least an hour before the surgery or whenever your surgeon recommends. This will give you time to check in and fill out any needed paperwork. PROCEDURE  The preparation:  You will change into a hospital gown.  A needle will be inserted in your arm. This is an intravenous access tube (IV). Medication will be able to flow directly into your body through this needle.  Small monitors will be put on your body. They are used to check your heart, blood pressure, and oxygen level.  You might be given medication that will help you relax (sedative).  You will be given a general anesthetic or spinal anesthesia.  The procedure:  Once you are asleep or numb from the waist down, your legs will be placed in stirrups.  The resectoscope will be passed through the urethra into the bladder.  Fluid will be passed through the resectoscope. This will fill the bladder with water.  The surgeon will examine the bladder through the scope. If the scope has a camera, it can take pictures from inside the bladder. They can be projected onto a TV screen.  The surgeon will use various tools to remove the tumor in small pieces. Sometimes a laser (a beam of light energy) is used. Other tools may use electric current.  A tube (catheter) will often be placed so that urine can drain into a bag outside the body. This process helps stop bleeding. This tube keeps blood clots from blocking the urethra.  The procedure usually takes 30 to 45 minutes. AFTER THE PROCEDURE   You will stay in a recovery area until the anesthesia has worn off. Your blood pressure and pulse will be checked every so often. Then you will be taken to a hospital room.  You may continue to get fluids through the IV  for awhile.  Some pain is normal. The catheter might be uncomfortable. Pain is usually not severe. If it is, ask for pain medicine.  Your urine may look bloody after a transurethral resection. This is normal.  If bleeding is heavy, a hospital caregiver may rinse out the bladder (irrigation) through the catheter.  Once the urine is clear, the catheter will be taken out.  You will need to stay in the hospital until you can urinate on your own.  Most people stay in the hospital for up to 4 days. PROGNOSIS   Transurethral resection is considered the best way to treat bladder tumors that are not too far along. For most people, the treatment is successful. Sometimes, though, more treatment is needed.  Bladder cancers can come back even after a successful procedure. Because of this, be sure to have a checkup with your caregiver every 3 to 6 months. If everything is OK for 3 years, you can  reduce the checkups to once a year. Document Released: 01/21/2009 Document Revised: 06/19/2011 Document Reviewed: 05/06/2013 [facility] Patient Information 2015 [place], [place]. This information is not intended to replace advice given to you by your health care provider. Make sure you discuss any questions you have with your health care provider.    Post Anesthesia Home Care Instructions  Activity: Get plenty of rest for the remainder of the day. A responsible adult should stay with you for 24 hours following the procedure.  For the next 24 hours, DO NOT: -Drive a car -Paediatric nurse -Drink alcoholic beverages -Take any medication unless instructed by your physician -Make any legal decisions or sign important papers.  Meals: Start with liquid foods such as gelatin or soup. Progress to regular foods as tolerated. Avoid greasy, spicy, heavy foods. If nausea and/or vomiting occur, drink only clear liquids until the nausea and/or vomiting subsides. Call your physician if vomiting continues.  Special  Instructions/Symptoms: Your throat may feel dry or sore from the anesthesia or the breathing tube placed in your throat during surgery. If this causes discomfort, gargle with warm salt water. The discomfort should disappear within 24 hours.

## 2013-12-10 NOTE — Transfer of Care (Signed)
Immediate Anesthesia Transfer of Care Note  Patient: [name]  Procedure(s) Performed: Procedure(s) (LRB): RESTAGING TRANSURETHRAL RESECTION OF BLADDER TUMOR WITH GYRUS (TURBT-GYRUS) (N/A) RETROGRADE PYELOGRAM   (Bilateral)  Patient Location: PACU  Anesthesia Type: General  Level of Consciousness: awake, oriented, sedated and patient cooperative  Airway & Oxygen Therapy: Patient Spontanous Breathing and Patient connected to face mask oxygen  Post-op Assessment: Report given to PACU RN and Post -op Vital signs reviewed and stable  Post vital signs: Reviewed and stable  Complications: No apparent anesthesia complications

## 2013-12-11 ENCOUNTER — Encounter (HOSPITAL_BASED_OUTPATIENT_CLINIC_OR_DEPARTMENT_OTHER): Payer: Self-pay | Admitting: Urology

## 2013-12-11 NOTE — Op Note (Signed)
NAMEDEMESHA, BOORMAN NO.:  [id number]  MEDICAL RECORD NO.:  [id number]  LOCATION:                                 FACILITY:  PHYSICIAN:  Alexis Frock, MD     DATE OF BIRTH:  09/14/1945  DATE OF PROCEDURE:  12/10/2013 DATE OF DISCHARGE:  12/10/2013                              OPERATIVE REPORT   PREOPERATIVE DIAGNOSIS:  Large volume bladder cancer.  POSTOPERATIVE DIAGNOSIS:  PROCEDURE: 1. Restaging transyurethral resection bladder tumor, volume large. 2. Bilateral retropyelogram interpretation.  ESTIMATED BLOOD LOSS:  Nil.  COMPLICATIONS:  None.  SPECIMEN: 1. Old resection site. 2. Base of old resection site. 3. Papillary tissue of bladder neck.  FINDINGS: 1. Some nodular erythema in the area of prior resection, worrisome for     possible residual tumor versus healing. 2. Complete right ureteral duplication. 3. Unremarkable bilateral retropyelograms. 4. Sparing of ureteral orifices during resection, no stenting     warranted. 5. Total resection area approximately 8 squared cm.  INDICATION:  Ms. Amedee is a 68 year old lady with history of very large volume low-grade bladder cancer status post initial resection approximately 6 weeks ago.  Fortunately, her pathology was quite favorable with superficial low-grade disease.  Given the incredible volume of tumor and the relatively surprising finding of low-grade pathology, it was felt that restaging was warranted.  Informed consent was obtained and placed in medical record.  PROCEDURE IN DETAIL:  The patient being Azariyah Luhrs verified and the procedure being restaging by trans resection of bladder tumor was confirmed.  Procedure was carried out.  Time-out was performed. Intravenous antibiotics were administered.  General LMA anesthesia was introduced.  Patient was placed into a low lithotomy position.  Sterile field was created prepping the patient's vagina, introitus, and proximal thighs  using iodine x3.  Next, cystourethroscopy was performed using a 22-French rigid cystoscope with 12-degree offset lens.  Inspection of urinary bladder revealed unusually wide right ureteral orifice consistent with known complete duplication, and there was significant amount of inflammatory nodular tissue in the left hemi-bladder near the trigone or previous tumor had once been, this was worrisome for possible early recurrence versus healing.  Total surface area approximately 8 squared cm.  Attention was then directed to retropyelogram first on the right.  The right upper pole moiety ureter was cannulated with a 6- [place] Foley catheter and right retrograde pyelogram was obtained.  Right retroperitoneum demonstrated a right upper pole moiety ureter without filling defects or narrowing.  Similarly, the right lower pole moiety ureter was cannulated and lower pole, there were no additional findings on retrograde pyelogram.  No filling defects or narrowing. Similarly, the left ureter was cannulated and left retropyelogram was obtained.  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  No filling defects or narrowing noted.  Next, the cystoscope was exchanged for a 26-French continuous flow resectoscope using medium loop with normal saline irrigation, very careful systematic resection was performed of the prior resection site in any worrisome tissue and what appeared to be fibromuscular stroma of the urinary bladder.  Great care was taken to avoid any direct injury to the ureteral orifices and this did not occur.  These tissue fragments were set aside, labeled as old resection site.  Next, cold cup biopsy forceps were used to obtain representative muscular bites from these areas. This was set aside and labeled as base of old resection site. Additional point coagulation current was applied to these areas.  The area of the bladder neck and very proximal  urethra, there was some papillary tissue worrisome also for possible carcinoma in this location and cold cup biopsy forceps was taken, this set aside and labeled as bladder neck.  A final inspection revealed excellent hemostasis.  No evidence of injury to the ureteral orifice with visible efflux of urine from all of them.  No evidence of bladder perforation.  It was felt that stenting or catheterization would not be warranted as such bladder was emptied per cystoscope.  Procedure was terminated.  The patient tolerated the procedure well.  There were no immediate periprocedural complications.  The patient was taken to the postanesthesia care unit in stable condition.          ______________________________ Alexis Frock, MD     TM/MEDQ  D:  12/10/2013  T:  12/11/2013  Job:  [id number]

## 2013-12-29 ENCOUNTER — Telehealth: Payer: Self-pay | Admitting: *Deleted

## 2013-12-29 NOTE — Telephone Encounter (Signed)
Patient called back to schedule AMWE, no provider preference.  OV scheduled with Dr. Reginia Forts 03/02/14 @ 1415.

## 2013-12-29 NOTE — Telephone Encounter (Signed)
Phoned patient regarding colonoscopy (performed by [facility]-performed by Dr. Penelope Coop in September 2014), phoned & spoke with Margreta Journey & she is faxing colo report.  When asked about her mammogram, patient said she would call & schedule it & will call me back later today to schedule her annual exam.  States she doesn't have a specific PCP, just whomever in our practice that can see her.

## 2014-03-02 ENCOUNTER — Encounter: Payer: Self-pay | Admitting: Family Medicine

## 2014-03-02 ENCOUNTER — Ambulatory Visit (INDEPENDENT_AMBULATORY_CARE_PROVIDER_SITE_OTHER): Payer: Medicare Other | Admitting: Family Medicine

## 2014-03-02 VITALS — BP 140/92 | HR 83 | Temp 97.9°F | Resp 16 | Ht 69.75 in | Wt 204.8 lb

## 2014-03-02 DIAGNOSIS — Z124 Encounter for screening for malignant neoplasm of cervix: Secondary | ICD-10-CM

## 2014-03-02 DIAGNOSIS — Z8551 Personal history of malignant neoplasm of bladder: Secondary | ICD-10-CM

## 2014-03-02 DIAGNOSIS — Z23 Encounter for immunization: Secondary | ICD-10-CM

## 2014-03-02 DIAGNOSIS — Z1239 Encounter for other screening for malignant neoplasm of breast: Secondary | ICD-10-CM

## 2014-03-02 DIAGNOSIS — Z13220 Encounter for screening for lipoid disorders: Secondary | ICD-10-CM

## 2014-03-02 DIAGNOSIS — Z131 Encounter for screening for diabetes mellitus: Secondary | ICD-10-CM

## 2014-03-02 DIAGNOSIS — Z026 Encounter for examination for insurance purposes: Secondary | ICD-10-CM

## 2014-03-02 DIAGNOSIS — Z13820 Encounter for screening for osteoporosis: Secondary | ICD-10-CM

## 2014-03-02 DIAGNOSIS — Z0000 Encounter for general adult medical examination without abnormal findings: Secondary | ICD-10-CM

## 2014-03-02 LAB — CBC WITH DIFFERENTIAL/PLATELET
Basophils Absolute: 0 10*3/uL (ref 0.0–0.1)
Basophils Relative: 0 % (ref 0–1)
Eosinophils Absolute: 0.2 10*3/uL (ref 0.0–0.7)
Eosinophils Relative: 3 % (ref 0–5)
HCT: 39.8 % (ref 36.0–46.0)
HEMOGLOBIN: 13.8 g/dL (ref 12.0–15.0)
LYMPHS ABS: 2.5 10*3/uL (ref 0.7–4.0)
Lymphocytes Relative: 41 % (ref 12–46)
MCH: 30.9 pg (ref 26.0–34.0)
MCHC: 34.7 g/dL (ref 30.0–36.0)
MCV: 89 fL (ref 78.0–100.0)
MPV: 8.8 fL — AB (ref 9.4–12.4)
Monocytes Absolute: 0.6 10*3/uL (ref 0.1–1.0)
Monocytes Relative: 9 % (ref 3–12)
NEUTROS ABS: 2.9 10*3/uL (ref 1.7–7.7)
NEUTROS PCT: 47 % (ref 43–77)
Platelets: 320 10*3/uL (ref 150–400)
RBC: 4.47 MIL/uL (ref 3.87–5.11)
RDW: 13.1 % (ref 11.5–15.5)
WBC: 6.2 10*3/uL (ref 4.0–10.5)

## 2014-03-02 LAB — COMPREHENSIVE METABOLIC PANEL
ALK PHOS: 84 U/L (ref 39–117)
ALT: 24 U/L (ref 0–35)
AST: 25 U/L (ref 0–37)
Albumin: 4.7 g/dL (ref 3.5–5.2)
BILIRUBIN TOTAL: 0.4 mg/dL (ref 0.2–1.2)
BUN: 15 mg/dL (ref 6–23)
CO2: 26 mEq/L (ref 19–32)
Calcium: 9.6 mg/dL (ref 8.4–10.5)
Chloride: 103 mEq/L (ref 96–112)
Creat: 0.73 mg/dL (ref 0.50–1.10)
Glucose, Bld: 88 mg/dL (ref 70–99)
POTASSIUM: 4.2 meq/L (ref 3.5–5.3)
Sodium: 138 mEq/L (ref 135–145)
Total Protein: 7.3 g/dL (ref 6.0–8.3)

## 2014-03-02 LAB — LIPID PANEL
Cholesterol: 224 mg/dL — ABNORMAL HIGH (ref 0–200)
HDL: 46 mg/dL (ref >39)
LDL CALC: 136 mg/dL — AB (ref 0–99)
TRIGLYCERIDES: 210 mg/dL — AB (ref <150)
Total CHOL/HDL Ratio: 4.9 Ratio
VLDL: 42 mg/dL — AB (ref 0–40)

## 2014-03-02 MED ORDER — ZOSTER VACCINE LIVE 19400 UNT/0.65ML ~~LOC~~ SOLR: 0.6500 mL | Freq: Once | SUBCUTANEOUS | Status: DC

## 2014-03-02 NOTE — Patient Instructions (Signed)

## 2014-03-02 NOTE — Progress Notes (Signed)
Subjective:    Patient ID: [name], female    DOB: 1946-01-25, 68 y.o.   MRN: [id number]  03/02/2014  Annual Exam   HPI  This 68 y.o. female presents for Annual Wellness Examination.  Last physical:  2005 Pap smear:  2005 Mammogram:   2005 Colonoscopy:  2014 Bone density:  never Td:  2002 Pneumovax: never Zostavax:  never Influenza:  Never; today. Eye exam:  Yearly; +glasses.  No g/c. Dental exam:  Every six months.  NO URINARY LEAKING/INCONTINENCE ISSUES.  Review of Systems  Constitutional: Negative for fever, chills, diaphoresis, activity change, appetite change, fatigue and unexpected weight change.  HENT: Negative for congestion, dental problem, drooling, ear discharge, ear pain, facial swelling, hearing loss, mouth sores, nosebleeds, postnasal drip, rhinorrhea, sinus pressure, sneezing, sore throat, tinnitus, trouble swallowing and voice change.   Eyes: Negative for photophobia, pain, discharge, redness, itching and visual disturbance.  Respiratory: Negative for apnea, cough, choking, chest tightness, shortness of breath, wheezing and stridor.   Cardiovascular: Negative for chest pain, palpitations and leg swelling.  Gastrointestinal: Negative for nausea, vomiting, abdominal pain, diarrhea, constipation, blood in stool, abdominal distention, anal bleeding and rectal pain.  Endocrine: Negative for cold intolerance, heat intolerance, polydipsia, polyphagia and polyuria.  Genitourinary: Negative for dysuria, urgency, frequency, hematuria, flank pain, decreased urine volume, vaginal bleeding, vaginal discharge, enuresis, difficulty urinating, genital sores, vaginal pain, menstrual problem, pelvic pain and dyspareunia.  Musculoskeletal: Negative for myalgias, back pain, joint swelling, arthralgias, gait problem, neck pain and neck stiffness.  Skin: Negative for color change, pallor, rash and wound.  Allergic/Immunologic: Negative for environmental allergies, food  allergies and immunocompromised state.  Neurological: Negative for dizziness, tremors, seizures, syncope, facial asymmetry, speech difficulty, weakness, light-headedness, numbness and headaches.  Hematological: Negative for adenopathy. Does not bruise/bleed easily.  Psychiatric/Behavioral: Negative for suicidal ideas, hallucinations, behavioral problems, confusion, sleep disturbance, self-injury, dysphoric mood, decreased concentration and agitation. The patient is not nervous/anxious and is not hyperactive.     Past Medical History  Diagnosis Date  . Wears contact lenses   . History of recurrent UTIs   . Bladder cancer 09/08/2013    Urology/[facility].   Past Surgical History  Procedure Laterality Date  . Bunionectomy Right 2000  . Tonsillectomy  87   (age 22)  . Benign breast bx Right 1995  . Colonoscopy w/ polypectomy  01/06/2013    single sessile polyp ascending colon.  [facility].  Marland Kitchen Transurethral resection of bladder tumor with gyrus (turbt-gyrus) N/A 10/22/2013    Procedure: TRANSURETHRAL RESECTION OF BLADDER TUMOR WITH ERBE.;  Surgeon: Alexis Frock, MD;  Location: [facility];  Service: Urology;  Laterality: N/A;  . Cystoscopy w/ ureteral stent placement Bilateral 10/22/2013    Procedure: CYSTOSCOPY WITH RETROGRADE PYELOGRAM;  Surgeon: Alexis Frock, MD;  Location: [facility];  Service: Urology;  Laterality: Bilateral;  . Transurethral resection of bladder tumor with gyrus (turbt-gyrus) N/A 12/10/2013    Procedure: RESTAGING TRANSURETHRAL RESECTION OF BLADDER TUMOR WITH GYRUS (TURBT-GYRUS);  Surgeon: Alexis Frock, MD;  Location: [facility];  Service: Urology;  Laterality: N/A;  . Cystoscopy w/ retrogrades Bilateral 12/10/2013    Procedure: RETROGRADE PYELOGRAM  ;  Surgeon: Alexis Frock, MD;  Location: [facility];  Service: Urology;  Laterality: Bilateral;   Allergies  Allergen Reactions  . Hycodan  [Hydrocodone-Homatropine] Hives   Current Outpatient Prescriptions  Medication Sig Dispense Refill  . senna-docusate (SENOKOT-S) 8.6-50 MG per tablet Take 1 tablet  by mouth 2 (two) times daily. While taking pain meds to prevent constipation. (Patient not taking: Reported on 03/02/2014) 30 tablet 0  . traMADol (ULTRAM) 50 MG tablet Take 1-2 tablets (50-100 mg total) by mouth every 6 (six) hours as needed for moderate pain or severe pain. Post-operatively (Patient not taking: Reported on 03/02/2014) 30 tablet 0  . zoster vaccine live, PF, (ZOSTAVAX) 70350 UNT/0.65ML injection Inject 19,400 Units into the skin once. 0.65 mL 0   No current facility-administered medications for this visit.       Objective:    BP 140/92 mmHg  Pulse 83  Temp(Src) 97.9 F (36.6 C) (Oral)  Resp 16  Ht 5' 9.75" (1.772 m)  Wt 204 lb 12.8 oz (92.897 kg)  BMI 29.59 kg/m2  SpO2 95% Physical Exam  Constitutional: She is oriented to person, place, and time. She appears well-developed and well-nourished. No distress.  HENT:  Head: Normocephalic and atraumatic.  Right Ear: External ear normal.  Left Ear: External ear normal.  Nose: Nose normal.  Mouth/Throat: Oropharynx is clear and moist.  Eyes: Conjunctivae and EOM are normal. Pupils are equal, round, and reactive to light.  Neck: Normal range of motion and full passive range of motion without pain. Neck supple. No JVD present. Carotid bruit is not present. No thyromegaly present.  Cardiovascular: Normal rate, regular rhythm and normal heart sounds.  Exam reveals no gallop and no friction rub.   No murmur heard. Pulmonary/Chest: Effort normal and breath sounds normal. She has no wheezes. She has no rales. Right breast exhibits no inverted nipple, no mass, no nipple discharge, no skin change and no tenderness. Left breast exhibits no inverted nipple, no mass, no nipple discharge, no skin change and no tenderness. Breasts are symmetrical.  Abdominal: Soft. Bowel  sounds are normal. She exhibits no distension and no mass. There is no tenderness. There is no rebound and no guarding.  Genitourinary: Vagina normal and uterus normal. There is no rash, tenderness, lesion or injury on the right labia. There is no rash, tenderness, lesion or injury on the left labia. Cervix exhibits no motion tenderness, no discharge and no friability. Right adnexum displays no mass, no tenderness and no fullness. Left adnexum displays no mass, no tenderness and no fullness.  Musculoskeletal:       Right shoulder: Normal.       Left shoulder: Normal.       Cervical back: Normal.  Lymphadenopathy:    She has no cervical adenopathy.  Neurological: She is alert and oriented to person, place, and time. She has normal reflexes. No cranial nerve deficit. She exhibits normal muscle tone. Coordination normal.  Skin: Skin is warm and dry. No rash noted. She is not diaphoretic. No erythema. No pallor.  Psychiatric: She has a normal mood and affect. Her behavior is normal. Judgment and thought content normal.  Nursing note and vitals reviewed.  PREVNAR-13 AND INFLUENZA VACCINES ADMINISTERED.     Assessment & Plan:   1. Encounter for Medicare annual wellness exam   2. Hx of bladder cancer   3. Flu vaccine need   4. Pap smear for cervical cancer screening   5. Screening for breast cancer   6. Screening cholesterol level   7. Screening for diabetes mellitus   8. Need for Zostavax administration   9. Need for prophylactic vaccination against Streptococcus pneumoniae (pneumococcus)       1. Annual Wellness Exam and Complete Physical Examination:  Anticipatory guidance provided --- weight loss,  exercise, 3 servings of calcium daily. Pap smear obtained; refer for mammogram.  Colonoscopy UTD.  Immunizations reviewed; s/p Prevnar 13 and influenza vaccines.  Independent with ADLs.  No evidence of depression.  Low fall risk. No hearing loss.  No urinary incontinence.  No advanced directives  but discussed in detail during visit.  2.  Gynecological exam: pap smear obtained; refer for mammogram.  Refer for bone density scan. 3.  Screening cholesterol: obtain FLP. 4.  Screening DMII: obtain glucose. 5.  S/p Prevnar and influenza vaccines; rx for Zostavax provided. 6.  History of bladder cancer: followed by urology closely.    Meds ordered this encounter  Medications  . zoster vaccine live, PF, (ZOSTAVAX) 46270 UNT/0.65ML injection    Sig: Inject 19,400 Units into the skin once.    Dispense:  0.65 mL    Refill:  0    Return in about 1 year (around 03/03/2015) for complete physical examiniation.    Reginia Forts, M.D.  [facility] [address] [place], [place]  [address] [phone] phone [phone] fax

## 2014-03-03 LAB — PAP IG (IMAGE GUIDED)

## 2014-03-07 NOTE — Addendum Note (Signed)
Addended by: Wardell Honour on: 03/07/2014 02:36 PM   Modules accepted: Orders, Medications

## 2014-05-20 LAB — HM MAMMOGRAPHY

## 2014-05-21 ENCOUNTER — Encounter: Payer: Self-pay | Admitting: Family Medicine

## 2014-06-03 ENCOUNTER — Encounter: Payer: Self-pay | Admitting: Family Medicine

## 2014-06-04 ENCOUNTER — Encounter: Payer: Self-pay | Admitting: *Deleted

## 2014-06-15 ENCOUNTER — Other Ambulatory Visit: Payer: Self-pay | Admitting: Urology

## 2014-07-08 ENCOUNTER — Encounter (HOSPITAL_BASED_OUTPATIENT_CLINIC_OR_DEPARTMENT_OTHER): Payer: Self-pay | Admitting: *Deleted

## 2014-07-08 NOTE — Progress Notes (Signed)
NPO AFTER MN. ARRIVE AT 0700. NEEDS ISTAT 8.

## 2014-07-14 NOTE — Anesthesia Preprocedure Evaluation (Addendum)
Anesthesia Evaluation  Patient identified by MRN, date of birth, ID band Patient awake    Reviewed: Allergy & Precautions, NPO status , Patient's Chart, lab work & pertinent test results  History of Anesthesia Complications Negative for: history of anesthetic complications  Airway Mallampati: II  TM Distance: >3 FB Neck ROM: Full    Dental no notable dental hx. (+) Dental Advisory Given   Pulmonary former smoker,  breath sounds clear to auscultation  Pulmonary exam normal       Cardiovascular negative cardio ROS  Rhythm:Regular Rate:Normal     Neuro/Psych negative neurological ROS  negative psych ROS   GI/Hepatic negative GI ROS, Neg liver ROS,   Endo/Other  negative endocrine ROS  Renal/GU Renal disease  Female GU complaint Hx of recurrent bladder cancer     Musculoskeletal negative musculoskeletal ROS (+)   Abdominal   Peds negative pediatric ROS (+)  Hematology negative hematology ROS (+)   Anesthesia Other Findings   Reproductive/Obstetrics negative OB ROS                           Anesthesia Physical Anesthesia Plan  ASA: II  Anesthesia Plan: General   Post-op Pain Management:    Induction: Intravenous  Airway Management Planned: LMA  Additional Equipment:   Intra-op Plan:   Post-operative Plan: Extubation in OR  Informed Consent: I have reviewed the patients History and Physical, chart, labs and discussed the procedure including the risks, benefits and alternatives for the proposed anesthesia with the patient or authorized representative who has indicated his/her understanding and acceptance.   Dental advisory given  Plan Discussed with: CRNA  Anesthesia Plan Comments:         Anesthesia Quick Evaluation

## 2014-07-15 ENCOUNTER — Ambulatory Visit (HOSPITAL_BASED_OUTPATIENT_CLINIC_OR_DEPARTMENT_OTHER): Payer: Medicare Other | Admitting: Anesthesiology

## 2014-07-15 ENCOUNTER — Encounter (HOSPITAL_BASED_OUTPATIENT_CLINIC_OR_DEPARTMENT_OTHER)
Admission: RE | Disposition: A | Discharge status: Home / Self Care | Payer: Self-pay | Source: Ambulatory Visit | Attending: Urology

## 2014-07-15 ENCOUNTER — Encounter (HOSPITAL_BASED_OUTPATIENT_CLINIC_OR_DEPARTMENT_OTHER): Payer: Self-pay | Admitting: *Deleted

## 2014-07-15 ENCOUNTER — Ambulatory Visit (HOSPITAL_BASED_OUTPATIENT_CLINIC_OR_DEPARTMENT_OTHER)
Admission: RE | Admit: 2014-07-15 | Discharge: 2014-07-15 | Disposition: A | Discharge status: Home / Self Care | Payer: Medicare Other | Source: Ambulatory Visit | Attending: Urology | Admitting: Urology

## 2014-07-15 DIAGNOSIS — N3020 Other chronic cystitis without hematuria: Secondary | ICD-10-CM | POA: Diagnosis not present

## 2014-07-15 DIAGNOSIS — Q630 Accessory kidney: Secondary | ICD-10-CM | POA: Diagnosis not present

## 2014-07-15 DIAGNOSIS — N281 Cyst of kidney, acquired: Secondary | ICD-10-CM | POA: Insufficient documentation

## 2014-07-15 DIAGNOSIS — C671 Malignant neoplasm of dome of bladder: Secondary | ICD-10-CM | POA: Diagnosis not present

## 2014-07-15 DIAGNOSIS — N289 Disorder of kidney and ureter, unspecified: Secondary | ICD-10-CM | POA: Insufficient documentation

## 2014-07-15 DIAGNOSIS — Z87891 Personal history of nicotine dependence: Secondary | ICD-10-CM | POA: Insufficient documentation

## 2014-07-15 DIAGNOSIS — C672 Malignant neoplasm of lateral wall of bladder: Secondary | ICD-10-CM | POA: Diagnosis not present

## 2014-07-15 DIAGNOSIS — C670 Malignant neoplasm of trigone of bladder: Secondary | ICD-10-CM | POA: Diagnosis not present

## 2014-07-15 HISTORY — DX: Personal history of adenomatous and serrated colon polyps: Z86.0101

## 2014-07-15 HISTORY — DX: Other specified congenital malformations of kidney: Q63.8

## 2014-07-15 HISTORY — DX: Cyst of kidney, acquired: N28.1

## 2014-07-15 HISTORY — PX: TRANSURETHRAL RESECTION OF BLADDER TUMOR WITH GYRUS (TURBT-GYRUS): SHX6458

## 2014-07-15 HISTORY — DX: Personal history of colonic polyps: Z86.010

## 2014-07-15 HISTORY — PX: CYSTOSCOPY W/ RETROGRADES: SHX1426

## 2014-07-15 LAB — POCT I-STAT, CHEM 8
BUN: 17 mg/dL (ref 6–23)
CALCIUM ION: 1.26 mmol/L (ref 1.13–1.30)
Chloride: 104 mmol/L (ref 96–112)
Creatinine, Ser: 0.7 mg/dL (ref 0.50–1.10)
Glucose, Bld: 102 mg/dL — ABNORMAL HIGH (ref 70–99)
HEMATOCRIT: 43 % (ref 36.0–46.0)
Hemoglobin: 14.6 g/dL (ref 12.0–15.0)
Potassium: 4.3 mmol/L (ref 3.5–5.1)
Sodium: 141 mmol/L (ref 135–145)
TCO2: 24 mmol/L (ref 0–100)

## 2014-07-15 SURGERY — TRANSURETHRAL RESECTION OF BLADDER TUMOR WITH GYRUS (TURBT-GYRUS)
Anesthesia: General | Site: Bladder

## 2014-07-15 MED ORDER — LACTATED RINGERS IV SOLN
INTRAVENOUS | Status: DC
  Administered 2014-07-15 (×2): via INTRAVENOUS
  Filled 2014-07-15: qty 1000

## 2014-07-15 MED ORDER — LIDOCAINE HCL (CARDIAC) 20 MG/ML IV SOLN
INTRAVENOUS | Status: DC | PRN
  Administered 2014-07-15: 80 mg via INTRAVENOUS

## 2014-07-15 MED ORDER — TRAMADOL HCL 50 MG PO TABS: 50.0000 mg | ORAL_TABLET | Freq: Four times a day (QID) | ORAL | Status: DC | PRN

## 2014-07-15 MED ORDER — PROPOFOL 10 MG/ML IV BOLUS
INTRAVENOUS | Status: DC | PRN
  Administered 2014-07-15: 200 mg via INTRAVENOUS

## 2014-07-15 MED ORDER — SENNOSIDES-DOCUSATE SODIUM 8.6-50 MG PO TABS: 1.0000 | ORAL_TABLET | Freq: Two times a day (BID) | ORAL | Status: DC

## 2014-07-15 MED ORDER — STERILE WATER FOR IRRIGATION IR SOLN
Status: DC | PRN
  Administered 2014-07-15: 500 mL

## 2014-07-15 MED ORDER — ONDANSETRON HCL 4 MG/2ML IJ SOLN
INTRAMUSCULAR | Status: DC | PRN
  Administered 2014-07-15: 4 mg via INTRAVENOUS

## 2014-07-15 MED ORDER — EPHEDRINE SULFATE 50 MG/ML IJ SOLN
INTRAMUSCULAR | Status: DC | PRN
  Administered 2014-07-15 (×4): 10 mg via INTRAVENOUS

## 2014-07-15 MED ORDER — MIDAZOLAM HCL 5 MG/5ML IJ SOLN
INTRAMUSCULAR | Status: DC | PRN
Start: 2014-07-15 — End: 2014-07-15
  Administered 2014-07-15: 2 mg via INTRAVENOUS

## 2014-07-15 MED ORDER — GENTAMICIN IN SALINE 1.6-0.9 MG/ML-% IV SOLN
80.0000 mg | INTRAVENOUS | Status: DC
  Filled 2014-07-15: qty 50

## 2014-07-15 MED ORDER — ACETAMINOPHEN 10 MG/ML IV SOLN
INTRAVENOUS | Status: DC | PRN
  Administered 2014-07-15: 1000 mg via INTRAVENOUS

## 2014-07-15 MED ORDER — FENTANYL CITRATE 0.05 MG/ML IJ SOLN
INTRAMUSCULAR | Status: AC
  Filled 2014-07-15: qty 4

## 2014-07-15 MED ORDER — FENTANYL CITRATE 0.05 MG/ML IJ SOLN
INTRAMUSCULAR | Status: DC | PRN
  Administered 2014-07-15: 50 ug via INTRAVENOUS

## 2014-07-15 MED ORDER — SODIUM CHLORIDE 0.9 % IR SOLN
Status: DC | PRN
  Administered 2014-07-15: 3000 mL via INTRAVESICAL

## 2014-07-15 MED ORDER — IOHEXOL 350 MG/ML SOLN
INTRAVENOUS | Status: DC | PRN
  Administered 2014-07-15: 12 mL

## 2014-07-15 MED ORDER — MITOMYCIN CHEMO FOR BLADDER INSTILLATION 40 MG
40.0000 mg | Freq: Once | INTRAVENOUS | Status: AC
  Administered 2014-07-15: 40 mg via INTRAVESICAL
  Filled 2014-07-15: qty 40

## 2014-07-15 MED ORDER — DEXAMETHASONE SODIUM PHOSPHATE 4 MG/ML IJ SOLN
INTRAMUSCULAR | Status: DC | PRN
  Administered 2014-07-15: 10 mg via INTRAVENOUS

## 2014-07-15 MED ORDER — GENTAMICIN SULFATE 40 MG/ML IJ SOLN
5.0000 mg/kg | INTRAVENOUS | Status: AC
  Administered 2014-07-15: 390 mg via INTRAVENOUS
  Filled 2014-07-15: qty 9.75

## 2014-07-15 MED ORDER — MIDAZOLAM HCL 2 MG/2ML IJ SOLN
INTRAMUSCULAR | Status: AC
  Filled 2014-07-15: qty 2

## 2014-07-15 SURGICAL SUPPLY — 31 items
BAG URINE DRAINAGE (UROLOGICAL SUPPLIES) IMPLANT
BAG URINE LEG 19OZ MD ST LTX (BAG) IMPLANT
BAG URINE LEG 500ML (DRAIN) IMPLANT
BAG URO CATCHER STRL LF (DRAPE) ×3 IMPLANT
BASKET ZERO TIP NITINOL 2.4FR (BASKET) IMPLANT
CATH FOLEY 2WAY SLVR  5CC 22FR (CATHETERS)
CATH FOLEY 2WAY SLVR 30CC 20FR (CATHETERS) IMPLANT
CATH FOLEY 2WAY SLVR 5CC 22FR (CATHETERS) IMPLANT
CATH INTERMIT  6FR 70CM (CATHETERS) ×3 IMPLANT
CLOTH BEACON ORANGE TIMEOUT ST (SAFETY) ×3 IMPLANT
ELECT BIVAP BIPO 22/24 DONUT (ELECTROSURGICAL)
ELECT LOOP 22F BIPOLAR SML (ELECTROSURGICAL) ×3
ELECT REM PT RETURN 9FT ADLT (ELECTROSURGICAL) ×3
ELECTRD BIVAP BIPO 22/24 DONUT (ELECTROSURGICAL) IMPLANT
ELECTRODE LOOP 22F BIPOLAR SML (ELECTROSURGICAL) ×2 IMPLANT
ELECTRODE REM PT RTRN 9FT ADLT (ELECTROSURGICAL) ×2 IMPLANT
EVACUATOR MICROVAS BLADDER (UROLOGICAL SUPPLIES) IMPLANT
FOLEY ×3 IMPLANT
GLOVE BIO SURGEON STRL SZ7.5 (GLOVE) ×3 IMPLANT
GOWN SPEC L3 XXLG W/TWL (GOWN DISPOSABLE) ×3 IMPLANT
GOWN STRL REUS W/TWL XL LVL3 (GOWN DISPOSABLE) ×3 IMPLANT
GUIDEWIRE ANG ZIPWIRE 038X150 (WIRE) ×3 IMPLANT
GUIDEWIRE STR DUAL SENSOR (WIRE) ×3 IMPLANT
IV NS IRRIG 3000ML ARTHROMATIC (IV SOLUTION) ×3 IMPLANT
LOOP CUT BIPOLAR 24F LRG (ELECTROSURGICAL) IMPLANT
PACK CYSTO (CUSTOM PROCEDURE TRAY) ×3 IMPLANT
PLUG CATH AND CAP STER (CATHETERS) ×3 IMPLANT
SET ASPIRATION TUBING (TUBING) IMPLANT
SYRINGE 10CC LL (SYRINGE) ×3 IMPLANT
SYRINGE IRR TOOMEY STRL 70CC (SYRINGE) IMPLANT
WATER STERILE IRR 500ML POUR (IV SOLUTION) ×3 IMPLANT

## 2014-07-15 NOTE — Anesthesia Postprocedure Evaluation (Signed)
  Anesthesia Post-op Note  Patient: [name]  Procedure(s) Performed: Procedure(s) (LRB): TRANSURETHRAL RESECTION OF BLADDER TUMOR WITH GYRUS (TURBT-GYRUS) (N/A) CYSTOSCOPY WITH RETROGRADE PYELOGRAM AND INSTILLATION OF MITOMYCIN C (Bilateral)  Patient Location: PACU  Anesthesia Type: General  Level of Consciousness: awake and alert   Airway and Oxygen Therapy: Patient Spontanous Breathing  Post-op Pain: mild  Post-op Assessment: Post-op Vital signs reviewed, Patient's Cardiovascular Status Stable, Respiratory Function Stable, Patent Airway and No signs of Nausea or vomiting  Last Vitals:  Filed Vitals:   07/15/14 1030  BP: 137/77  Pulse: 84  Temp:   Resp: 14    Post-op Vital Signs: stable   Complications: No apparent anesthesia complications

## 2014-07-15 NOTE — Discharge Instructions (Signed)
1 - You may have urinary urgency (bladder spasms) and bloody urine on / off x 1-2 weeks. This is normal.  2 - Call MD or go to ER for fever >102, severe pain / nausea / vomiting not relieved by medications, or acute change in medical status   Transurethral Resection, Bladder Tumor A cancerous growth (tumor) can develop on the inside wall of the bladder. The bladder is the organ that holds urine. One way to remove the tumor is a procedure called a transurethral resection. The tumor is removed (resected) through the tube that carries urine from the bladder out of the body (urethra). No cuts (incisions) are made in the skin. Instead, the procedure is done through a thin telescope, called a resectoscope. Attached to it is a light and usually a tiny camera. The resectoscope is put into the urethra. In men, the urethra opens at the end of the penis. In women, it opens just above the vagina.  A transurethral resection is usually used to remove tumors that have not gotten too big or too deep. These are called Stage 0, Stage 1 or Stage 2 bladder cancers. LET YOUR CAREGIVER KNOW ABOUT:  On the day of the procedure, your caregivers will need to know the last time you had anything to eat or drink. This includes water, gum, and candy. In advance, make sure they know about:   Any allergies.  All medications you are taking, including:  Herbs, eyedrops, over-the-counter medications and creams.  Blood thinners (anticoagulants), aspirin or other drugs that could affect blood clotting.  Use of steroids (by mouth or as creams).  Previous problems with anesthetics, including local anesthetics.  Possibility of pregnancy, if this applies.  Any history of blood clots.  Any history of bleeding or other blood problems.  Previous surgery.  Smoking history.  Any recent symptoms of colds or infections.  Other health problems. RISKS AND COMPLICATIONS This is usually a safe procedure. Every procedure has  risks, though. For a transurethral resection, they include:  Infection. Antibiotic medication would need to be taken.  Bleeding.  Light bleeding may last for several days after the procedure.  If bleeding continues or is heavy, the bladder may need rinsing. Or, a new catheter might be put in for awhile.  Sometimes bed rest is needed.  Urination problems.  Pain and burning can occur when urinating. This usually goes away in a few days.  Scarring from the procedure can block the flow of urine.  Bladder damage.  It can be punctured or torn during removal of the tumor. If this happens, a catheter might be needed for longer. Antibiotics would be taken while the bladder heals.  Urine can leak through the hole or tear into the abdomen. If this happens, surgery may be needed to repair the bladder. BEFORE THE PROCEDURE   A medical evaluation will be done. This may include:  A physical examination.  Urine test. This is to make sure you do not have a urinary tract infection.  Blood tests.  A test that checks the heart's rhythm (electrocardiogram).  Talking with an anesthesiologist. This is the person who will be in charge of the medication (anesthesia) to keep you from feeling pain during the transurethral resection. You might be asleep during the procedure (general anesthesia) or numb from the waist down, but awake during the procedure (spinal anesthesia). Ask your surgeon what to expect.  The person who is having a transurethral resection needs to give what is called informed  consent. This requires signing a legal paper that gives permission for the procedure. To give informed consent:  You must understand how the procedure is done and why.  You must be told all the risks and benefits of the procedure.  You must sign the consent. Sometimes a legal guardian can do this.  Signing should be witnessed by a healthcare professional.  The day before the surgery, eat only a light  dinner. Then, do not eat or drink anything for at least 8 hours before the surgery. Ask your caregiver if it is OK to take any needed medicines with a sip of water.  Arrive at least an hour before the surgery or whenever your surgeon recommends. This will give you time to check in and fill out any needed paperwork. PROCEDURE  The preparation:  You will change into a hospital gown.  A needle will be inserted in your arm. This is an intravenous access tube (IV). Medication will be able to flow directly into your body through this needle.  Small monitors will be put on your body. They are used to check your heart, blood pressure, and oxygen level.  You might be given medication that will help you relax (sedative).  You will be given a general anesthetic or spinal anesthesia.  The procedure:  Once you are asleep or numb from the waist down, your legs will be placed in stirrups.  The resectoscope will be passed through the urethra into the bladder.  Fluid will be passed through the resectoscope. This will fill the bladder with water.  The surgeon will examine the bladder through the scope. If the scope has a camera, it can take pictures from inside the bladder. They can be projected onto a TV screen.  The surgeon will use various tools to remove the tumor in small pieces. Sometimes a laser (a beam of light energy) is used. Other tools may use electric current.  A tube (catheter) will often be placed so that urine can drain into a bag outside the body. This process helps stop bleeding. This tube keeps blood clots from blocking the urethra.  The procedure usually takes 30 to 45 minutes. AFTER THE PROCEDURE   You will stay in a recovery area until the anesthesia has worn off. Your blood pressure and pulse will be checked every so often. Then you will be taken to a hospital room.  You may continue to get fluids through the IV for awhile.  Some pain is normal. The catheter might be  uncomfortable. Pain is usually not severe. If it is, ask for pain medicine.  Your urine may look bloody after a transurethral resection. This is normal.  If bleeding is heavy, a hospital caregiver may rinse out the bladder (irrigation) through the catheter.  Once the urine is clear, the catheter will be taken out.  You will need to stay in the hospital until you can urinate on your own.  Most people stay in the hospital for up to 4 days. PROGNOSIS   Transurethral resection is considered the best way to treat bladder tumors that are not too far along. For most people, the treatment is successful. Sometimes, though, more treatment is needed.  Bladder cancers can come back even after a successful procedure. Because of this, be sure to have a checkup with your caregiver every 3 to 6 months. If everything is OK for 3 years, you can reduce the checkups to once a year. Document Released: 01/21/2009 Document Revised: 06/19/2011 Document Reviewed:  05/06/2013 ExitCare Patient Information 2015 [place], [place]. This information is not intended to replace advice given to you by your health care provider. Make sure you discuss any questions you have with your health care provider.     Post Anesthesia Home Care Instructions  Activity: Get plenty of rest for the remainder of the day. A responsible adult should stay with you for 24 hours following the procedure.  For the next 24 hours, DO NOT: -Drive a car -Paediatric nurse -Drink alcoholic beverages -Take any medication unless instructed by your physician -Make any legal decisions or sign important papers.  Meals: Start with liquid foods such as gelatin or soup. Progress to regular foods as tolerated. Avoid greasy, spicy, heavy foods. If nausea and/or vomiting occur, drink only clear liquids until the nausea and/or vomiting subsides. Call your physician if vomiting continues.  Special Instructions/Symptoms: Your throat may feel dry or sore from  the anesthesia or the breathing tube placed in your throat during surgery. If this causes discomfort, gargle with warm salt water. The discomfort should disappear within 24 hours.  If you had a scopolamine patch placed behind your ear for the management of post- operative nausea and/or vomiting:  1. The medication in the patch is effective for 72 hours, after which it should be removed.  Wrap patch in a tissue and discard in the trash. Wash hands thoroughly with soap and water. 2. You may remove the patch earlier than 72 hours if you experience unpleasant side effects which may include dry mouth, dizziness or visual disturbances. 3. Avoid touching the patch. Wash your hands with soap and water after contact with the patch.

## 2014-07-15 NOTE — H&P (Signed)
[name] is an 69 y.o. female.    Chief Complaint: Pre-OP Transurethral Resection of Bladder Tumor  HPI:   1 - Bladder Cancer - Massive volume multifocal bladder cancer by CT and cysto 2015 on eval for persistant dysuria and hematuria. Large trigone and left wall components. No hydro or pelvic adenopathy by CT Urogram.   Recent Course: 10/2013 - TaG1 with muscle in specimen by massive volume TURBT; 01/2014 reTURBT TaG1 with muscle in specimen 03/2014 - surve cysto NED (some posterior small erythema, favor healing);  06/2014 multifocal small volume recurrence, mostly posterior and dome.   2 - Dysuria / Chronic Cystitis - Long h/o irritative voiding symptoms and some CX-positive cystitis episodes. Most recent UCX Klebsiella, Strep sens FQ, augmentin, bactrim.  3 - Non-Complex Rt Renal Cyst - incidental <2cm non-complex rt cyst on CT Urogram 09/2013.  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  PMH sig for TNA, foot surgery. NO CV disease. NO strong blood thinners. NO chest or abdominal surgeries. She gets primary care through [facility] on [address]  Today "[name]" is seen to proceed with transurethral resection of her recurrent bladder cancer. No interval fevers. Most recent UA without infectious parameters.   Past Medical History  Diagnosis Date  . Wears contact lenses   . History of recurrent UTIs   . Bladder cancer RECURRENT     UROLOGIST-  [facility]  . Renal cyst, left     NON-COMPLEX  . Duplex kidney     RIGHT  . History of adenomatous polyp of colon     Past Surgical History  Procedure Laterality Date  . Bunionectomy Right 2000  . Tonsillectomy  42   (age 39)  . Benign breast bx Right 1995  . Colonoscopy w/ polypectomy  01/06/2013    single sessile polyp ascending colon.  [facility].  Marland Kitchen Transurethral resection of bladder tumor with gyrus (turbt-gyrus) N/A  10/22/2013    Procedure: TRANSURETHRAL RESECTION OF BLADDER TUMOR WITH ERBE.;  Surgeon: Alexis Frock, MD;  Location: [facility];  Service: Urology;  Laterality: N/A;  . Cystoscopy w/ ureteral stent placement Bilateral 10/22/2013    Procedure: CYSTOSCOPY WITH RETROGRADE PYELOGRAM;  Surgeon: Alexis Frock, MD;  Location: [facility];  Service: Urology;  Laterality: Bilateral;  . Transurethral resection of bladder tumor with gyrus (turbt-gyrus) N/A 12/10/2013    Procedure: RESTAGING TRANSURETHRAL RESECTION OF BLADDER TUMOR WITH GYRUS (TURBT-GYRUS);  Surgeon: Alexis Frock, MD;  Location: [facility];  Service: Urology;  Laterality: N/A;  . Cystoscopy w/ retrogrades Bilateral 12/10/2013    Procedure: RETROGRADE PYELOGRAM  ;  Surgeon: Alexis Frock, MD;  Location: [facility];  Service: Urology;  Laterality: Bilateral;    Family History  Problem Relation Age of Onset  . Hypertension Mother   . Arthritis Mother   . Cancer Father     prostate cancer  . Cancer Brother     prostate cancer  . Cancer Brother 50    Melanoma  . Mental illness Sister 15    bipolar disorder   Social History:  reports that she quit smoking about 20 months ago. Her smoking use included Cigarettes. She has a 3 pack-year smoking history. She has never used smokeless tobacco. She reports that she drinks alcohol. She reports that she does not use illicit drugs.  Allergies:  Allergies  Allergen  Reactions  . Hycodan [Hydrocodone-Homatropine] Hives    No prescriptions prior to admission    No results found for this or any previous visit (from the past 48 hour(s)). No results found.  Review of Systems  Constitutional: Negative.   HENT: Negative.   Eyes: Negative.   Respiratory: Negative.   Cardiovascular: Negative.   Gastrointestinal: Negative.   Genitourinary: Negative.   Musculoskeletal: Negative.   Skin: Negative.   Neurological: Negative.    Endo/Heme/Allergies: Negative.   Psychiatric/Behavioral: Negative.     There were no vitals taken for this visit. Physical Exam  Constitutional: She appears well-developed.  HENT:  Head: Normocephalic.  Eyes: Pupils are equal, round, and reactive to light.  Neck: Normal range of motion.  Cardiovascular: Normal rate.   Respiratory: Effort normal.  GI: Soft.  Genitourinary:  No CVAT  Musculoskeletal: Normal range of motion.  Neurological: She is alert.  Skin: Skin is warm.  Psychiatric: She has a normal mood and affect. Her behavior is normal. Judgment and thought content normal.     Assessment/Plan   1 - Bladder Cancer -  recurrent small volume by most recent cysto, REc proceed with TURBT + MIto-C and then strongly consider induction BCG following pending final path.  We rediscussed operative biopsy / transurethral resection as the best next step for diagnostic and therapeutic purposes with goals being to remove all visible cancer and obtain tissue for pathologic exam. We rediscussed that for some low-grade tumors, this may be all the treatment required, but that for many other tumors such as high-grade lesions, further therapy including surgery and or chemotherapy may be warranted. We also reoutlined the fact that any bladder cancer diagnosis will require close follow-up with periodic upper and lower tract evaluation. We rediscussed risks including bleeding, infection, damage to kidney / ureter / bladder including bladder perforation which can typically managed with prolonged foley catheterization. We rementioned anesthetic and other rare risks including DVT, PE, MI, and mortality. I also rementioned that adjunctive procedures such as ureteral stenting, retrograde pyelography, and ureteroscopy may be necessary to fully evaluate the urinary tract depending on intra-operative findings.   After answering all questions to the patient's satisfaction, they wish to proceed.    2 - Dysuria /  Chronic Cystitis - improved substantially following removal of large bladder tumor, continue current regimen.    3 - Non-Complex Rt Renal Cyst - no specific surveillance warranted.   4 - Rt Duplex Kidney - no hydro to suggest functional abnormality. May impact bladder cancer treatment if needs future cystectomy as would require separate ureteral anastamoses.  [name] 07/15/2014, 6:09 AM

## 2014-07-15 NOTE — Anesthesia Procedure Notes (Signed)
Procedure Name: LMA Insertion Date/Time: 07/15/2014 8:28 AM Performed by: Wanita Chamberlain Pre-anesthesia Checklist: Patient identified, Timeout performed, Emergency Drugs available, Suction available and Patient being monitored Patient Re-evaluated:Patient Re-evaluated prior to inductionOxygen Delivery Method: Circle system utilized Preoxygenation: Pre-oxygenation with 100% oxygen Intubation Type: IV induction Ventilation: Mask ventilation without difficulty LMA: LMA inserted LMA Size: 4.0 Number of attempts: 1 Airway Equipment and Method: Bite block Placement Confirmation: positive ETCO2 and breath sounds checked- equal and bilateral Tube secured with: Tape Dental Injury: Teeth and Oropharynx as per pre-operative assessment

## 2014-07-15 NOTE — Transfer of Care (Signed)
Immediate Anesthesia Transfer of Care Note  Patient: KYOMI HECTOR  Procedure(s) Performed: Procedure(s): TRANSURETHRAL RESECTION OF BLADDER TUMOR WITH GYRUS (TURBT-GYRUS) (N/A) CYSTOSCOPY WITH RETROGRADE PYELOGRAM AND INSTILLATION OF MITOMYCIN C (Bilateral)  Patient Location: PACU  Anesthesia Type:General  Level of Consciousness: awake, alert , oriented and patient cooperative  Airway & Oxygen Therapy: Patient Spontanous Breathing and Patient connected to nasal cannula oxygen  Post-op Assessment: Report given to RN and Post -op Vital signs reviewed and stable  Post vital signs: Reviewed and stable  Last Vitals:  Filed Vitals:   07/15/14 0706  BP: 148/84  Pulse: 75  Temp: 36.8 C  Resp: 16    Complications: No apparent anesthesia complications

## 2014-07-15 NOTE — Brief Op Note (Signed)
07/15/2014  9:13 AM  PATIENT:  [name]  70 y.o. female  PRE-OPERATIVE DIAGNOSIS:  RECURRENT BLADDER CANCER  POST-OPERATIVE DIAGNOSIS:  RECURRENT BLADDER CANCER  PROCEDURE:  Procedure(s): TRANSURETHRAL RESECTION OF BLADDER TUMOR WITH GYRUS (TURBT-GYRUS) (N/A) CYSTOSCOPY WITH RETROGRADE PYELOGRAM AND INSTILLATION OF MITOMYCIN C (Bilateral)  SURGEON:  Surgeon(s) and Role:    * Alexis Frock, MD - Primary  PHYSICIAN ASSISTANT:   ASSISTANTS: none   ANESTHESIA:   general  EBL:  Total I/O In: -  Out: 100 [Urine:100]  BLOOD ADMINISTERED:none  DRAINS: 49F foley to straight drain   LOCAL MEDICATIONS USED:  NONE  SPECIMEN:  Source of Specimen:  1 - bladder tumor, 2 - base of bladder tumor  DISPOSITION OF SPECIMEN:  PATHOLOGY  COUNTS:  YES  TOURNIQUET:  * No tourniquets in log *  DICTATION: .Other Dictation: Dictation Number [id number]  PLAN OF CARE: Discharge to home after PACU  PATIENT DISPOSITION:  PACU - hemodynamically stable.   Delay start of Pharmacological VTE agent (>24hrs) due to surgical blood loss or risk of bleeding: not applicable

## 2014-07-16 ENCOUNTER — Encounter (HOSPITAL_BASED_OUTPATIENT_CLINIC_OR_DEPARTMENT_OTHER): Payer: Self-pay | Admitting: Urology

## 2014-07-16 NOTE — Op Note (Signed)
[name], [name] NO.:  [id number]  MEDICAL RECORD NO.:  [id number]  LOCATION:                                 FACILITY:  PHYSICIAN:  Alexis Frock, MD     DATE OF BIRTH:  [birth year]  DATE OF PROCEDURE:  07/15/2014                              OPERATIVE REPORT   DIAGNOSIS:  Recurrent bladder cancer.  PROCEDURE: 1. Transurethral resection of bladder tumor, volume medium. 2. Bilateral retrograde pyelograms interpretation. 3. Instillation of mitomycin C chemotherapeutic agent.  ESTIMATED BLOOD LOSS:  Nil.  COMPLICATIONS:  None.  SPECIMENS: 1. Bladder tumor fragments. 2. Base of bladder tumor.  FINDINGS: 1. Multifocal recurrence of papillary bladder tumor.  Total volume     approximately 4 cm2.  This included recurrence in the trigone,     right wall, left wall, and dome areas. 2. Unremarkable bilateral retrograde pyelograms including right duplex     ureters to the level of the lower right UO. 3. One small area of papillary tumor, within 2 cm of the right     ureteral orifice.  This was very carefully removed using cold     energy to avoid injury to the right ureteral orifices.  It was not     felt that postoperative stenting was warranted.  Photodocumentation     was performed.  INDICATION:  [name] is a very pleasant, but unfortunate lady with history of very large volume, recurrent low-grade bladder cancer.  She has undergone previous staged transurethral resection.  She was found on followup surveillance cystoscopy to have multifocal recurrence.  Options were discussed including repeat transurethral resection with concomitant mitomycin instillation and she wished to proceed.  Informed consent was obtained and placed in medical record.  PROCEDURE IN DETAIL:  The patient being [name] was verified. Procedure being transurethral resection of bladder tumor and retrograde pyelography was confirmed.  Procedure was carried out.   Time-out was performed.  Intravenous antibiotics were administered.  General LMA anesthesia was introduced.  The patient was placed in a low lithotomy position.  Sterile field was created by prepping and draping the patient's vagina, introitus, and proximal thighs using iodine x3.  Next, cystourethroscopy was performed using a 22-French rigid cystoscope with 30-degree offset lens.  Inspection of urinary bladder revealed multifocal bladder tumor recurrence including small papillary tumors in the trigone just superolateral to the right ureteral orifice, right wall, left wall, and dome areas respectively.  Total volume was approximately 4 cm2.  Attention was directed to the retrograde pyelography.  First, the left ureteral orifice was cannulated with a 6- French end-hole catheter and left retrograde pyelogram was obtained.  Left retrograde pyelogram demonstrated a single left ureter, single system left kidney.  No filling defects or narrowing noted.  Next, the right upper pole moiety ureter was cannulated and retrograde pyelogram was performed.  Right upper pole moiety retrograde pyelogram revealed a right ureter to the level of the bladder without filling defects or narrowing or hydronephrosis.  The lower pole moiety ureter was cannulated and lower pole retrograde pyelogram also revealed a single right ureter to the level of the bladder without filling defects or narrowing of the lower pole collecting system  or ureter.  The cystoscope was exchanged for a 26-French Erbe resectoscope sheath with a medium- sized loop and very careful resection was performed of all of the aforementioned bladder tumors that appeared to be the seromuscular fibers of the urinary bladder.  In the area of the tumor close to the right ureteral orifice, a cold technique was used scraping the tumor away from the urothelium and just very light coagulation current applied to the base of this.  This was opened complete  visual removal of this area of tumor without injury to ureteral orifices.  There was visible efflux of urine from both moiety ureteral orifices.  Following these maneuvers, it was not felt that stenting would be warranted on the right side.  These bladder tumor fragments were irrigated and set aside for permanent pathology.  Next, cold cup biopsy forceps were used to obtain a representative deep sections of what appeared to be the lamina propria of the urinary bladder x2.  These were set aside for permanent pathology, labeled base of bladder tumor.  Additional coagulation current was applied at the base of these areas.  There was no evidence of perforation.  There was excellent hemostasis.  The resectoscope was exchanged for a 16-French Foley catheter which was completely drained. Catheter put in place.  After waking the patient, mitomycin instillation was performed by instilling 40 mg and 40 mL of mitomycin solution using aseptic technique, taking great care to avoid any spillage of the chemotherapeutic and urinary bladder.  This was held in place for 1 hour, then drained.  The catheter removed.  The patient tolerated the procedure well.  There were no immediate complications.  The patient was taken to the postanesthesia care unit in stable condition.          ______________________________ Alexis Frock, MD     TM/MEDQ  D:  07/15/2014  T:  07/16/2014  Job:  [id number]

## 2014-09-09 DIAGNOSIS — C670 Malignant neoplasm of trigone of bladder: Secondary | ICD-10-CM | POA: Diagnosis not present

## 2014-09-09 DIAGNOSIS — Z5111 Encounter for antineoplastic chemotherapy: Secondary | ICD-10-CM | POA: Diagnosis not present

## 2014-09-09 DIAGNOSIS — C672 Malignant neoplasm of lateral wall of bladder: Secondary | ICD-10-CM | POA: Diagnosis not present

## 2014-09-16 DIAGNOSIS — Z5111 Encounter for antineoplastic chemotherapy: Secondary | ICD-10-CM | POA: Diagnosis not present

## 2014-09-16 DIAGNOSIS — C672 Malignant neoplasm of lateral wall of bladder: Secondary | ICD-10-CM | POA: Diagnosis not present

## 2014-10-05 ENCOUNTER — Encounter: Payer: Self-pay | Admitting: *Deleted

## 2014-11-02 DIAGNOSIS — N3020 Other chronic cystitis without hematuria: Secondary | ICD-10-CM | POA: Diagnosis not present

## 2014-11-02 DIAGNOSIS — C679 Malignant neoplasm of bladder, unspecified: Secondary | ICD-10-CM | POA: Diagnosis not present

## 2014-11-25 DIAGNOSIS — N63 Unspecified lump in breast: Secondary | ICD-10-CM | POA: Diagnosis not present

## 2014-11-25 DIAGNOSIS — N6001 Solitary cyst of right breast: Secondary | ICD-10-CM | POA: Diagnosis not present

## 2014-11-25 LAB — HM MAMMOGRAPHY

## 2014-12-23 ENCOUNTER — Encounter: Payer: Self-pay | Admitting: Family Medicine

## 2014-12-24 ENCOUNTER — Encounter (HOSPITAL_COMMUNITY): Payer: Self-pay | Admitting: *Deleted

## 2014-12-25 ENCOUNTER — Encounter: Payer: Self-pay | Admitting: Family Medicine

## 2014-12-25 ENCOUNTER — Ambulatory Visit (INDEPENDENT_AMBULATORY_CARE_PROVIDER_SITE_OTHER): Payer: Medicare Other | Admitting: Family Medicine

## 2014-12-25 VITALS — BP 144/85 | HR 81 | Temp 98.4°F | Resp 16 | Ht 71.0 in | Wt 208.0 lb

## 2014-12-25 DIAGNOSIS — E2839 Other primary ovarian failure: Secondary | ICD-10-CM

## 2014-12-25 DIAGNOSIS — IMO0001 Reserved for inherently not codable concepts without codable children: Secondary | ICD-10-CM

## 2014-12-25 DIAGNOSIS — C679 Malignant neoplasm of bladder, unspecified: Secondary | ICD-10-CM

## 2014-12-25 DIAGNOSIS — E7800 Pure hypercholesterolemia, unspecified: Secondary | ICD-10-CM

## 2014-12-25 DIAGNOSIS — Z23 Encounter for immunization: Secondary | ICD-10-CM

## 2014-12-25 DIAGNOSIS — Z131 Encounter for screening for diabetes mellitus: Secondary | ICD-10-CM

## 2014-12-25 DIAGNOSIS — Z6829 Body mass index (BMI) 29.0-29.9, adult: Secondary | ICD-10-CM | POA: Diagnosis not present

## 2014-12-25 DIAGNOSIS — R030 Elevated blood-pressure reading, without diagnosis of hypertension: Secondary | ICD-10-CM

## 2014-12-25 DIAGNOSIS — E663 Overweight: Secondary | ICD-10-CM | POA: Diagnosis not present

## 2014-12-25 DIAGNOSIS — Z0000 Encounter for general adult medical examination without abnormal findings: Secondary | ICD-10-CM

## 2014-12-25 LAB — CBC WITH DIFFERENTIAL/PLATELET
BASOS PCT: 1 % (ref 0–1)
Basophils Absolute: 0 10*3/uL (ref 0.0–0.1)
EOS PCT: 3 % (ref 0–5)
Eosinophils Absolute: 0.1 10*3/uL (ref 0.0–0.7)
HEMATOCRIT: 39.5 % (ref 36.0–46.0)
HEMOGLOBIN: 13.4 g/dL (ref 12.0–15.0)
Lymphocytes Relative: 38 % (ref 12–46)
Lymphs Abs: 1.9 10*3/uL (ref 0.7–4.0)
MCH: 31.5 pg (ref 26.0–34.0)
MCHC: 33.9 g/dL (ref 30.0–36.0)
MCV: 92.9 fL (ref 78.0–100.0)
MONO ABS: 0.4 10*3/uL (ref 0.1–1.0)
MONOS PCT: 9 % (ref 3–12)
MPV: 8.9 fL (ref 8.6–12.4)
NEUTROS ABS: 2.4 10*3/uL (ref 1.7–7.7)
Neutrophils Relative %: 49 % (ref 43–77)
Platelets: 292 10*3/uL (ref 150–400)
RBC: 4.25 MIL/uL (ref 3.87–5.11)
RDW: 13.5 % (ref 11.5–15.5)
WBC: 4.9 10*3/uL (ref 4.0–10.5)

## 2014-12-25 LAB — COMPREHENSIVE METABOLIC PANEL
ALBUMIN: 4.4 g/dL (ref 3.6–5.1)
ALT: 25 U/L (ref 6–29)
AST: 24 U/L (ref 10–35)
Alkaline Phosphatase: 70 U/L (ref 33–130)
BUN: 16 mg/dL (ref 7–25)
CALCIUM: 9.1 mg/dL (ref 8.6–10.4)
CHLORIDE: 105 mmol/L (ref 98–110)
CO2: 26 mmol/L (ref 20–31)
Creat: 0.87 mg/dL (ref 0.50–0.99)
GLUCOSE: 90 mg/dL (ref 65–99)
Potassium: 4.5 mmol/L (ref 3.5–5.3)
SODIUM: 140 mmol/L (ref 135–146)
Total Bilirubin: 0.7 mg/dL (ref 0.2–1.2)
Total Protein: 6.7 g/dL (ref 6.1–8.1)

## 2014-12-25 LAB — LIPID PANEL
CHOL/HDL RATIO: 5.9 ratio — AB (ref <=5.0)
Cholesterol: 249 mg/dL — ABNORMAL HIGH (ref 125–200)
HDL: 42 mg/dL — AB (ref >=46)
LDL CALC: 171 mg/dL — AB (ref <130)
TRIGLYCERIDES: 182 mg/dL — AB (ref <150)
VLDL: 36 mg/dL — AB (ref <30)

## 2014-12-25 MED ORDER — ZOSTER VACCINE LIVE 19400 UNT/0.65ML ~~LOC~~ SOLR: 0.6500 mL | Freq: Once | SUBCUTANEOUS | Status: DC

## 2014-12-25 NOTE — Progress Notes (Signed)
Subjective:    Patient ID: [name], female    DOB: November 11, 1945, 69 y.o.   MRN: [id number]  12/25/2014  Annual Exam   HPI This 69 y.o. female presents for Annual Wellness Examination and Complete physical examination.  Last physical:  03-02-2014 Pap smear:  03-02-2014  WNL Mammogram:  04-2014; additional views; repeat in six months. Colonoscopy:  2014; [facility]; repeat in 5 years. Bone density: ordered last year; no record of results. TDAP:   Several years ago.  Pneumovax:  Prevnar 13 2015; no previous Pneumovax. Zostavax:  Went to receive vaccine but expensive.  Influenza:  Agreeable. Eye exam:  +glasses; annually; six weeks ago. Dental exam:  Twice per year.   Hypercholesterolemia: due for repeat labs.  S/p trial of exercise, weight loss, and dietary modification.    Bladder cancer:  Had recurrence of bladder cancer; s/p BCG treatments with good results; followed every three months for cystoscopy.  S/p surgical resection of cancer; smaller surgery.  [place].    Review of Systems  Constitutional: Negative for fever, chills, diaphoresis, activity change, appetite change, fatigue and unexpected weight change.  HENT: Negative for congestion, dental problem, drooling, ear discharge, ear pain, facial swelling, hearing loss, mouth sores, nosebleeds, postnasal drip, rhinorrhea, sinus pressure, sneezing, sore throat, tinnitus, trouble swallowing and voice change.   Eyes: Negative for photophobia, pain, discharge, redness, itching and visual disturbance.  Respiratory: Negative for apnea, cough, choking, chest tightness, shortness of breath, wheezing and stridor.   Cardiovascular: Negative for chest pain, palpitations and leg swelling.  Gastrointestinal: Negative for nausea, vomiting, abdominal pain, diarrhea, constipation, blood in stool, abdominal distention, anal bleeding and rectal pain.  Endocrine: Negative for cold intolerance, heat intolerance, polydipsia, polyphagia and  polyuria.  Genitourinary: Negative for dysuria, urgency, frequency, hematuria, flank pain, decreased urine volume, vaginal bleeding, vaginal discharge, enuresis, difficulty urinating, genital sores, vaginal pain, menstrual problem, pelvic pain and dyspareunia.       NO URINARY INCONTINENCE ISSUES.  Musculoskeletal: Negative for myalgias, back pain, joint swelling, arthralgias, gait problem, neck pain and neck stiffness.  Skin: Negative for color change, pallor, rash and wound.  Allergic/Immunologic: Negative for environmental allergies, food allergies and immunocompromised state.  Neurological: Negative for dizziness, tremors, seizures, syncope, facial asymmetry, speech difficulty, weakness, light-headedness, numbness and headaches.  Hematological: Negative for adenopathy. Does not bruise/bleed easily.  Psychiatric/Behavioral: Negative for suicidal ideas, hallucinations, behavioral problems, confusion, sleep disturbance, self-injury, dysphoric mood, decreased concentration and agitation. The patient is not nervous/anxious and is not hyperactive.     Past Medical History  Diagnosis Date  . Wears contact lenses   . History of recurrent UTIs   . Bladder cancer RECURRENT     UROLOGIST-  [facility]  . Renal cyst, left     NON-COMPLEX  . Duplex kidney     RIGHT  . History of adenomatous polyp of colon    Past Surgical History  Procedure Laterality Date  . Bunionectomy Right 2000  . Tonsillectomy  52   (age 45)  . Benign breast bx Right 1995  . Colonoscopy w/ polypectomy  01/06/2013    single sessile polyp ascending colon.  [facility].  Marland Kitchen Transurethral resection of bladder tumor with gyrus (turbt-gyrus) N/A 10/22/2013    Procedure: TRANSURETHRAL RESECTION OF BLADDER TUMOR WITH ERBE.;  Surgeon: Alexis Frock, MD;  Location: [facility];  Service: Urology;  Laterality: N/A;  . Cystoscopy w/ ureteral stent placement Bilateral 10/22/2013    Procedure: CYSTOSCOPY WITH RETROGRADE  PYELOGRAM;  Surgeon: Alexis Frock, MD;  Location: [facility];  Service: Urology;  Laterality: Bilateral;  . Transurethral resection of bladder tumor with gyrus (turbt-gyrus) N/A 12/10/2013    Procedure: RESTAGING TRANSURETHRAL RESECTION OF BLADDER TUMOR WITH GYRUS (TURBT-GYRUS);  Surgeon: Alexis Frock, MD;  Location: [facility];  Service: Urology;  Laterality: N/A;  . Cystoscopy w/ retrogrades Bilateral 12/10/2013    Procedure: RETROGRADE PYELOGRAM  ;  Surgeon: Alexis Frock, MD;  Location: [facility];  Service: Urology;  Laterality: Bilateral;  . Transurethral resection of bladder tumor with gyrus (turbt-gyrus) N/A 07/15/2014    Procedure: TRANSURETHRAL RESECTION OF BLADDER TUMOR WITH GYRUS (TURBT-GYRUS);  Surgeon: Alexis Frock, MD;  Location: [facility];  Service: Urology;  Laterality: N/A;  . Cystoscopy w/ retrogrades Bilateral 07/15/2014    Procedure: CYSTOSCOPY WITH RETROGRADE PYELOGRAM AND INSTILLATION OF MITOMYCIN C;  Surgeon: Alexis Frock, MD;  Location: [facility];  Service: Urology;  Laterality: Bilateral;   Allergies  Allergen Reactions  . Hycodan [Hydrocodone-Homatropine] Hives    Social History   Social History  . Marital Status: Divorced    Spouse Name: N/A  . Number of Children: N/A  . Years of Education: N/A   Occupational History  . Not on file.   Social History Main Topics  . Smoking status: Former Smoker -- 0.15 packs/day for 20 years    Types: Cigarettes    Quit date: 10/17/2012  . Smokeless tobacco: Never Used  . Alcohol Use: Yes     Comment: OCCASIONAL  . Drug Use: No  . Sexual Activity: Not on file   Other Topics Concern  . Not on file   Social History Narrative   Marital status:  Divorced; not dating.      Children:  3 children; 2 grandchildren; family within walking distants.      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      Tobacco:  None; quit in 09/2012.      Alcohol: 1 drink per week.       Drugs:  None      Exercise:  One hour of water aerobics 3-4 days per week.      Seatbelt: 100%      Guns:  none      ADLs: independent with all ADLs; drives; no assistant device.      Advanced Directives:     Family History  Problem Relation Age of Onset  . Hypertension Mother   . Arthritis Mother     knee replacement; shoulder replacement  . Heart disease Mother     AAA  . Cancer Father     prostate cancer  . Cancer Brother     prostate cancer  . Cancer Brother 50    Melanoma  . Mental illness Sister 40    bipolar disorder       Objective:    BP 144/85 mmHg  Pulse 81  Temp(Src) 98.4 F (36.9 C) (Oral)  Resp 16  Ht '5\' 11"'  (1.803 m)  Wt 208 lb (94.348 kg)  BMI 29.02 kg/m2 Physical Exam  Constitutional: She is oriented to person, place, and time. She appears well-developed and well-nourished. No distress.  HENT:  Head: Normocephalic and atraumatic.  Right Ear: External ear normal.  Left Ear: External ear normal.  Nose: Nose normal.  Mouth/Throat: Oropharynx is clear and moist.  Eyes: Conjunctivae and EOM are normal. Pupils  are equal, round, and reactive to light.  Neck: Normal range of motion and full passive range of motion without pain. Neck supple. No JVD present. Carotid bruit is not present. No thyromegaly present.  Cardiovascular: Normal rate, regular rhythm and normal heart sounds.  Exam reveals no gallop and no friction rub.   No murmur heard. Pulmonary/Chest: Effort normal and breath sounds normal. She has no wheezes. She has no rales. Right breast exhibits no inverted nipple, no mass, no nipple discharge, no skin change and no tenderness. Left breast exhibits no inverted nipple, no mass, no nipple discharge, no skin change and no tenderness. Breasts are symmetrical.  Abdominal: Soft. Bowel sounds are normal. She exhibits no distension and no mass. There is no tenderness.  There is no rebound and no guarding.  Genitourinary: Vagina normal and uterus normal. There is no rash, tenderness, lesion or injury on the right labia. There is no rash, tenderness, lesion or injury on the left labia. Cervix exhibits no motion tenderness, no discharge and no friability. Right adnexum displays no mass, no tenderness and no fullness. Left adnexum displays no mass, no tenderness and no fullness.  Musculoskeletal:       Right shoulder: Normal.       Left shoulder: Normal.       Cervical back: Normal.  Lymphadenopathy:    She has no cervical adenopathy.  Neurological: She is alert and oriented to person, place, and time. She has normal reflexes. No cranial nerve deficit. She exhibits normal muscle tone. Coordination normal.  Skin: Skin is warm and dry. No rash noted. She is not diaphoretic. No erythema. No pallor.  Psychiatric: She has a normal mood and affect. Her behavior is normal. Judgment and thought content normal.  Nursing note and vitals reviewed.  Results for orders placed or performed in visit on 12/25/14  CBC with Differential/Platelet  Result Value Ref Range   WBC 4.9 4.0 - 10.5 K/uL   RBC 4.25 3.87 - 5.11 MIL/uL   Hemoglobin 13.4 12.0 - 15.0 g/dL   HCT 39.5 36.0 - 46.0 %   MCV 92.9 78.0 - 100.0 fL   MCH 31.5 26.0 - 34.0 pg   MCHC 33.9 30.0 - 36.0 g/dL   RDW 13.5 11.5 - 15.5 %   Platelets 292 150 - 400 K/uL   MPV 8.9 8.6 - 12.4 fL   Neutrophils Relative % 49 43 - 77 %   Neutro Abs 2.4 1.7 - 7.7 K/uL   Lymphocytes Relative 38 12 - 46 %   Lymphs Abs 1.9 0.7 - 4.0 K/uL   Monocytes Relative 9 3 - 12 %   Monocytes Absolute 0.4 0.1 - 1.0 K/uL   Eosinophils Relative 3 0 - 5 %   Eosinophils Absolute 0.1 0.0 - 0.7 K/uL   Basophils Relative 1 0 - 1 %   Basophils Absolute 0.0 0.0 - 0.1 K/uL   Smear Review Criteria for review not met   Comprehensive metabolic panel  Result Value Ref Range   Sodium 140 135 - 146 mmol/L   Potassium 4.5 3.5 - 5.3 mmol/L   Chloride  105 98 - 110 mmol/L   CO2 26 20 - 31 mmol/L   Glucose, Bld 90 65 - 99 mg/dL   BUN 16 7 - 25 mg/dL   Creat 0.87 0.50 - 0.99 mg/dL   Total Bilirubin 0.7 0.2 - 1.2 mg/dL   Alkaline Phosphatase 70 33 - 130 U/L   AST 24 10 - 35 U/L  ALT 25 6 - 29 U/L   Total Protein 6.7 6.1 - 8.1 g/dL   Albumin 4.4 3.6 - 5.1 g/dL   Calcium 9.1 8.6 - 10.4 mg/dL  Lipid panel  Result Value Ref Range   Cholesterol 249 (H) 125 - 200 mg/dL   Triglycerides 182 (H) <150 mg/dL   HDL 42 (L) >=46 mg/dL   Total CHOL/HDL Ratio 5.9 (H) <=5.0 Ratio   VLDL 36 (H) <30 mg/dL   LDL Cholesterol 171 (H) <130 mg/dL   PNEUMOVAX AND INFLUENZA VACCINES ADMINISTERED.    Assessment & Plan:   1. Encounter for Medicare annual wellness exam   2. Routine physical examination   3. Pure hypercholesterolemia   4. Malignant neoplasm of urinary bladder, unspecified site   5. Need for prophylactic vaccination against Streptococcus pneumoniae (pneumococcus)   6. Need for Zostavax administration   7. Screening for diabetes mellitus   8. Need for prophylactic vaccination and inoculation against influenza   9. Estrogen deficiency   10. Blood pressure elevated     1.  Annual Wellness Examination: anticipatory guidance --- exercise, weight loss, ASA 36m daily, 3 servings of dairy daily.  Pap smear WNL 2015.  Mammogram UTD. Refer for bone density scan.  Colonoscopy UTD. S/p Pneumovax and influenza vaccines; cannot afford Zostavax. Independent with ADLs.  Low fall risk. Has Advanced Directives. NO URINARY INCONTINENCE ISSUES.  2.  Complete Physical Examination: completed today. 3.  Hypercholesterolemia:  New; repeat today; may warrant medication if remains elevated. 4.  Screening DMII: obtain glucose. 5.  Recurrent bladder cancer: followed by urology closely. 6.  Rx for Zostavax provided again to see if coverage has improved. 7.  Estrogen deficiency: refer for bone density scan. 8.  Blood pressure elevated: New.  Recommend weight loss,  exercise, low-salt diet; RTC six to twelve months for recheck. 9. S/p Pneumovax and influenza vaccines.   Orders Placed This Encounter  Procedures  . DG Bone Density    Standing Status: Future     Number of Occurrences:      Standing Expiration Date: 02/24/2016    Scheduling Instructions:     [facility]    Order Specific Question:  Reason for Exam (SYMPTOM  OR DIAGNOSIS REQUIRED)    Answer:  estrogen deficiency; screenign    Order Specific Question:  Preferred imaging location?    Answer:  External  . Pneumococcal polysaccharide vaccine 23-valent greater than or equal to 2yo subcutaneous/IM  . Flu Vaccine QUAD 36+ mos IM  . CBC with Differential/Platelet  . Comprehensive metabolic panel    Order Specific Question:  Has the patient fasted?    Answer:  Yes  . Lipid panel    Order Specific Question:  Has the patient fasted?    Answer:  Yes   Meds ordered this encounter  Medications  . DISCONTD: zoster vaccine live, PF, (ZOSTAVAX) [id number]/0.65ML injection    Sig: Inject 19,400 Units into the skin once.    Dispense:  1 each    Refill:  0  . atorvastatin (LIPITOR) 10 MG tablet    Sig: Take 1 tablet (10 mg total) by mouth daily at 6 PM.    Dispense:  90 tablet    Refill:  1    Return in about 1 year (around 12/25/2015) for complete physical examiniation.     Kristi MElayne Guerin M.D. [facility] [place]  [phone]

## 2014-12-25 NOTE — Patient Instructions (Signed)

## 2015-01-03 MED ORDER — ATORVASTATIN CALCIUM 10 MG PO TABS
10.0000 mg | ORAL_TABLET | Freq: Every day | ORAL | Status: DC
Start: 2015-01-03 — End: 2015-04-07

## 2015-01-11 ENCOUNTER — Telehealth: Payer: Self-pay

## 2015-01-11 DIAGNOSIS — E7800 Pure hypercholesterolemia, unspecified: Secondary | ICD-10-CM

## 2015-01-11 NOTE — Telephone Encounter (Signed)
Cholesterol   Orders to be tested   Patient wants copy of her labs mailed to her  Patient wants to stop in to have this done in two months.   [phone]

## 2015-01-13 NOTE — Telephone Encounter (Signed)
I have placed future order for patient to return in two months for repeat cholesterol panel.  Please advise patient. Also, please make sure that copy of labs have been sent to patient.

## 2015-01-13 NOTE — Telephone Encounter (Signed)
Can we place future order?

## 2015-01-14 NOTE — Telephone Encounter (Signed)
Pt. Called back and was advised.

## 2015-01-14 NOTE — Telephone Encounter (Signed)
No answer, left VM to call back

## 2015-01-15 DIAGNOSIS — Z780 Asymptomatic menopausal state: Secondary | ICD-10-CM | POA: Diagnosis not present

## 2015-01-31 ENCOUNTER — Encounter: Payer: Self-pay | Admitting: Family Medicine

## 2015-02-03 ENCOUNTER — Telehealth: Payer: Self-pay | Admitting: Radiology

## 2015-02-03 NOTE — Telephone Encounter (Signed)
Called the patient and left message on AM with the results from her Bone Density scan. I also advised her to repeat BD Scan in 5 years

## 2015-02-15 ENCOUNTER — Encounter: Payer: Self-pay | Admitting: Family Medicine

## 2015-03-16 DIAGNOSIS — R310 Gross hematuria: Secondary | ICD-10-CM | POA: Diagnosis not present

## 2015-03-16 DIAGNOSIS — C7911 Secondary malignant neoplasm of bladder: Secondary | ICD-10-CM | POA: Diagnosis not present

## 2015-03-16 DIAGNOSIS — N3020 Other chronic cystitis without hematuria: Secondary | ICD-10-CM | POA: Diagnosis not present

## 2015-03-16 DIAGNOSIS — N281 Cyst of kidney, acquired: Secondary | ICD-10-CM | POA: Diagnosis not present

## 2015-03-18 ENCOUNTER — Other Ambulatory Visit: Payer: Self-pay | Admitting: Urology

## 2015-04-07 ENCOUNTER — Encounter (HOSPITAL_BASED_OUTPATIENT_CLINIC_OR_DEPARTMENT_OTHER): Payer: Self-pay | Admitting: *Deleted

## 2015-04-07 NOTE — Progress Notes (Signed)
NPO AFTER MN.  ARRIVE AT 0900.  NEEDS ISTAT 8.

## 2015-04-14 ENCOUNTER — Ambulatory Visit (HOSPITAL_BASED_OUTPATIENT_CLINIC_OR_DEPARTMENT_OTHER): Payer: Medicare Other | Admitting: Anesthesiology

## 2015-04-14 ENCOUNTER — Encounter (HOSPITAL_BASED_OUTPATIENT_CLINIC_OR_DEPARTMENT_OTHER)
Admission: RE | Disposition: A | Discharge status: Home / Self Care | Payer: Self-pay | Source: Ambulatory Visit | Attending: Urology

## 2015-04-14 ENCOUNTER — Encounter (HOSPITAL_BASED_OUTPATIENT_CLINIC_OR_DEPARTMENT_OTHER): Payer: Self-pay

## 2015-04-14 ENCOUNTER — Ambulatory Visit (HOSPITAL_BASED_OUTPATIENT_CLINIC_OR_DEPARTMENT_OTHER)
Admission: RE | Admit: 2015-04-14 | Discharge: 2015-04-14 | Disposition: A | Discharge status: Home / Self Care | Payer: Medicare Other | Source: Ambulatory Visit | Attending: Urology | Admitting: Urology

## 2015-04-14 DIAGNOSIS — Q630 Accessory kidney: Secondary | ICD-10-CM | POA: Diagnosis not present

## 2015-04-14 DIAGNOSIS — Z87891 Personal history of nicotine dependence: Secondary | ICD-10-CM | POA: Insufficient documentation

## 2015-04-14 DIAGNOSIS — N281 Cyst of kidney, acquired: Secondary | ICD-10-CM | POA: Insufficient documentation

## 2015-04-14 DIAGNOSIS — C675 Malignant neoplasm of bladder neck: Secondary | ICD-10-CM | POA: Diagnosis not present

## 2015-04-14 DIAGNOSIS — C678 Malignant neoplasm of overlapping sites of bladder: Secondary | ICD-10-CM | POA: Insufficient documentation

## 2015-04-14 DIAGNOSIS — E785 Hyperlipidemia, unspecified: Secondary | ICD-10-CM | POA: Insufficient documentation

## 2015-04-14 DIAGNOSIS — C679 Malignant neoplasm of bladder, unspecified: Secondary | ICD-10-CM | POA: Diagnosis not present

## 2015-04-14 DIAGNOSIS — N8110 Cystocele, unspecified: Secondary | ICD-10-CM | POA: Diagnosis not present

## 2015-04-14 DIAGNOSIS — Q625 Duplication of ureter: Secondary | ICD-10-CM | POA: Diagnosis not present

## 2015-04-14 DIAGNOSIS — R310 Gross hematuria: Secondary | ICD-10-CM | POA: Diagnosis not present

## 2015-04-14 DIAGNOSIS — Q638 Other specified congenital malformations of kidney: Secondary | ICD-10-CM | POA: Diagnosis not present

## 2015-04-14 DIAGNOSIS — D414 Neoplasm of uncertain behavior of bladder: Secondary | ICD-10-CM | POA: Diagnosis not present

## 2015-04-14 HISTORY — PX: TRANSURETHRAL RESECTION OF BLADDER TUMOR WITH GYRUS (TURBT-GYRUS): SHX6458

## 2015-04-14 HISTORY — PX: CYSTOSCOPY WITH RETROGRADE PYELOGRAM, URETEROSCOPY AND STENT PLACEMENT: SHX5789

## 2015-04-14 LAB — POCT I-STAT, CHEM 8
BUN: 18 mg/dL (ref 6–20)
CALCIUM ION: 1.19 mmol/L (ref 1.13–1.30)
Chloride: 105 mmol/L (ref 101–111)
Creatinine, Ser: 0.8 mg/dL (ref 0.44–1.00)
Glucose, Bld: 98 mg/dL (ref 65–99)
HEMATOCRIT: 43 % (ref 36.0–46.0)
HEMOGLOBIN: 14.6 g/dL (ref 12.0–15.0)
Potassium: 4.5 mmol/L (ref 3.5–5.1)
SODIUM: 141 mmol/L (ref 135–145)
TCO2: 27 mmol/L (ref 0–100)

## 2015-04-14 SURGERY — TRANSURETHRAL RESECTION OF BLADDER TUMOR WITH GYRUS (TURBT-GYRUS)
Anesthesia: General | Site: Ureter

## 2015-04-14 MED ORDER — LACTATED RINGERS IV SOLN
INTRAVENOUS | Status: DC
  Administered 2015-04-14 (×2): via INTRAVENOUS
  Filled 2015-04-14: qty 1000

## 2015-04-14 MED ORDER — KETOROLAC TROMETHAMINE 30 MG/ML IJ SOLN
INTRAMUSCULAR | Status: AC
  Filled 2015-04-14: qty 1

## 2015-04-14 MED ORDER — MIDAZOLAM HCL 2 MG/2ML IJ SOLN
INTRAMUSCULAR | Status: AC
  Filled 2015-04-14: qty 2

## 2015-04-14 MED ORDER — ONDANSETRON HCL 4 MG/2ML IJ SOLN
INTRAMUSCULAR | Status: DC | PRN
  Administered 2015-04-14: 4 mg via INTRAVENOUS

## 2015-04-14 MED ORDER — DEXAMETHASONE SODIUM PHOSPHATE 10 MG/ML IJ SOLN
INTRAMUSCULAR | Status: AC
  Filled 2015-04-14: qty 1

## 2015-04-14 MED ORDER — SENNOSIDES-DOCUSATE SODIUM 8.6-50 MG PO TABS: 1.0000 | ORAL_TABLET | Freq: Two times a day (BID) | ORAL | Status: DC

## 2015-04-14 MED ORDER — GENTAMICIN IN SALINE 1.6-0.9 MG/ML-% IV SOLN
80.0000 mg | INTRAVENOUS | Status: AC
  Administered 2015-04-14: 80 mg via INTRAVENOUS
  Filled 2015-04-14 (×2): qty 50

## 2015-04-14 MED ORDER — TRAMADOL HCL 50 MG PO TABS
50.0000 mg | ORAL_TABLET | Freq: Four times a day (QID) | ORAL | Status: DC | PRN
  Administered 2015-04-14: 50 mg via ORAL
  Filled 2015-04-14: qty 1

## 2015-04-14 MED ORDER — PROPOFOL 10 MG/ML IV BOLUS
INTRAVENOUS | Status: DC | PRN
  Administered 2015-04-14: 200 mg via INTRAVENOUS

## 2015-04-14 MED ORDER — LIDOCAINE HCL (CARDIAC) 20 MG/ML IV SOLN
INTRAVENOUS | Status: AC
  Filled 2015-04-14: qty 5

## 2015-04-14 MED ORDER — KETOROLAC TROMETHAMINE 30 MG/ML IJ SOLN
INTRAMUSCULAR | Status: DC | PRN
  Administered 2015-04-14: 30 mg via INTRAVENOUS

## 2015-04-14 MED ORDER — MEPERIDINE HCL 25 MG/ML IJ SOLN
6.2500 mg | INTRAMUSCULAR | Status: DC | PRN
  Filled 2015-04-14: qty 1

## 2015-04-14 MED ORDER — CIPROFLOXACIN HCL 500 MG PO TABS: 500.0000 mg | ORAL_TABLET | Freq: Two times a day (BID) | ORAL | Status: DC

## 2015-04-14 MED ORDER — MIDAZOLAM HCL 5 MG/5ML IJ SOLN
INTRAMUSCULAR | Status: DC | PRN
  Administered 2015-04-14: 2 mg via INTRAVENOUS

## 2015-04-14 MED ORDER — DEXAMETHASONE SODIUM PHOSPHATE 4 MG/ML IJ SOLN
INTRAMUSCULAR | Status: DC | PRN
  Administered 2015-04-14: 10 mg via INTRAVENOUS

## 2015-04-14 MED ORDER — LIDOCAINE HCL (CARDIAC) 20 MG/ML IV SOLN
INTRAVENOUS | Status: DC | PRN
  Administered 2015-04-14: 100 mg via INTRAVENOUS

## 2015-04-14 MED ORDER — IOHEXOL 300 MG/ML  SOLN
INTRAMUSCULAR | Status: DC | PRN
  Administered 2015-04-14: 40 mL

## 2015-04-14 MED ORDER — FENTANYL CITRATE (PF) 100 MCG/2ML IJ SOLN
INTRAMUSCULAR | Status: AC
  Filled 2015-04-14: qty 4

## 2015-04-14 MED ORDER — FENTANYL CITRATE (PF) 100 MCG/2ML IJ SOLN
INTRAMUSCULAR | Status: DC | PRN
  Administered 2015-04-14 (×2): 50 ug via INTRAVENOUS

## 2015-04-14 MED ORDER — STERILE WATER FOR IRRIGATION IR SOLN
Status: DC | PRN
  Administered 2015-04-14: 500 mL

## 2015-04-14 MED ORDER — ONDANSETRON HCL 4 MG/2ML IJ SOLN
INTRAMUSCULAR | Status: AC
  Filled 2015-04-14: qty 2

## 2015-04-14 MED ORDER — PROPOFOL 10 MG/ML IV BOLUS
INTRAVENOUS | Status: AC
  Filled 2015-04-14: qty 40

## 2015-04-14 MED ORDER — PHENYLEPHRINE HCL 10 MG/ML IJ SOLN
INTRAMUSCULAR | Status: DC | PRN
  Administered 2015-04-14 (×2): 120 ug via INTRAVENOUS

## 2015-04-14 MED ORDER — TRAMADOL HCL 50 MG PO TABS
ORAL_TABLET | ORAL | Status: AC
  Filled 2015-04-14: qty 1

## 2015-04-14 MED ORDER — FENTANYL CITRATE (PF) 100 MCG/2ML IJ SOLN
25.0000 ug | INTRAMUSCULAR | Status: DC | PRN
  Administered 2015-04-14: 25 ug via INTRAVENOUS
  Filled 2015-04-14: qty 1

## 2015-04-14 MED ORDER — SODIUM CHLORIDE 0.9 % IR SOLN
Status: DC | PRN
  Administered 2015-04-14: 1000 mL via INTRAVESICAL
  Administered 2015-04-14: 3000 mL

## 2015-04-14 MED ORDER — PHENYLEPHRINE 40 MCG/ML (10ML) SYRINGE FOR IV PUSH (FOR BLOOD PRESSURE SUPPORT)
PREFILLED_SYRINGE | INTRAVENOUS | Status: AC
  Filled 2015-04-14: qty 10

## 2015-04-14 MED ORDER — TRAMADOL HCL 50 MG PO TABS: 50.0000 mg | ORAL_TABLET | Freq: Four times a day (QID) | ORAL | Status: DC | PRN

## 2015-04-14 MED ORDER — FENTANYL CITRATE (PF) 100 MCG/2ML IJ SOLN
INTRAMUSCULAR | Status: AC
Start: 2015-04-14 — End: 2015-04-14
  Filled 2015-04-14: qty 2

## 2015-04-14 SURGICAL SUPPLY — 43 items
BAG DRAIN URO-CYSTO SKYTR STRL (DRAIN) ×3 IMPLANT
BAG URINE DRAINAGE (UROLOGICAL SUPPLIES) IMPLANT
BAG URINE LEG 19OZ MD ST LTX (BAG) IMPLANT
BAG URINE LEG 500ML (DRAIN) IMPLANT
BAG URO CATCHER STRL LF (MISCELLANEOUS) ×3 IMPLANT
BASKET DAKOTA 1.9FR 11X120 (BASKET) IMPLANT
BASKET LASER NITINOL 1.9FR (BASKET) ×3 IMPLANT
BASKET ZERO TIP NITINOL 2.4FR (BASKET) IMPLANT
CATH FOLEY 2WAY SLVR  5CC 22FR (CATHETERS)
CATH FOLEY 2WAY SLVR 30CC 20FR (CATHETERS) IMPLANT
CATH FOLEY 2WAY SLVR 5CC 22FR (CATHETERS) IMPLANT
CATH INTERMIT  6FR 70CM (CATHETERS) ×3 IMPLANT
CLOTH BEACON ORANGE TIMEOUT ST (SAFETY) ×3 IMPLANT
ELECT BIVAP BIPO 22/24 DONUT (ELECTROSURGICAL)
ELECT REM PT RETURN 9FT ADLT (ELECTROSURGICAL) ×3
ELECTRD BIVAP BIPO 22/24 DONUT (ELECTROSURGICAL) IMPLANT
ELECTRODE REM PT RTRN 9FT ADLT (ELECTROSURGICAL) ×2 IMPLANT
EVACUATOR MICROVAS BLADDER (UROLOGICAL SUPPLIES) IMPLANT
FIBER LASER FLEXIVA 365 (UROLOGICAL SUPPLIES) IMPLANT
FIBER LASER TRAC TIP (UROLOGICAL SUPPLIES) IMPLANT
GLOVE BIO SURGEON STRL SZ 6.5 (GLOVE) ×3 IMPLANT
GLOVE BIO SURGEON STRL SZ7.5 (GLOVE) ×3 IMPLANT
GLOVE INDICATOR 6.5 STRL GRN (GLOVE) ×6 IMPLANT
GOWN STRL REUS W/ TWL LRG LVL3 (GOWN DISPOSABLE) ×4 IMPLANT
GOWN STRL REUS W/ TWL XL LVL3 (GOWN DISPOSABLE) IMPLANT
GOWN STRL REUS W/TWL LRG LVL3 (GOWN DISPOSABLE) ×2
GOWN STRL REUS W/TWL XL LVL3 (GOWN DISPOSABLE)
GUIDEWIRE ANG ZIPWIRE 038X150 (WIRE) ×6 IMPLANT
GUIDEWIRE STR DUAL SENSOR (WIRE) ×3 IMPLANT
IV NS 1000ML (IV SOLUTION) ×3
IV NS 1000ML BAXH (IV SOLUTION) ×6 IMPLANT
IV NS IRRIG 3000ML ARTHROMATIC (IV SOLUTION) ×3 IMPLANT
KIT ROOM TURNOVER WOR (KITS) ×3 IMPLANT
LOOP CUT BIPOLAR 24F LRG (ELECTROSURGICAL) ×3 IMPLANT
MANIFOLD NEPTUNE II (INSTRUMENTS) ×3 IMPLANT
PACK CYSTO (CUSTOM PROCEDURE TRAY) ×3 IMPLANT
SET ASPIRATION TUBING (TUBING) IMPLANT
STENT POLARIS 5FRX24 (STENTS) ×3 IMPLANT
STENT POLARIS 5FRX26 (STENTS) ×3 IMPLANT
SYRINGE 10CC LL (SYRINGE) ×3 IMPLANT
SYRINGE IRR TOOMEY STRL 70CC (SYRINGE) IMPLANT
TUBE CONNECTING 12X1/4 (SUCTIONS) ×3 IMPLANT
TUBE FEEDING 8FR 16IN STR KANG (MISCELLANEOUS) IMPLANT

## 2015-04-14 NOTE — Brief Op Note (Signed)
04/14/2015  11:42 AM  PATIENT:  Julian Hy  70 y.o. female  PRE-OPERATIVE DIAGNOSIS:  RECURRENT BLADDER CANCER  POST-OPERATIVE DIAGNOSIS:  RECURRENT BLADDER CANCER  PROCEDURE:  Procedure(s): TRANSURETHRAL RESECTION OF BLADDER TUMOR WITH GYRUS (TURBT-GYRUS) (N/A) CYSTOSCOPY WITH RETROGRADE PYELOGRAM, URETEROSCOPY AND RIGHT STENT PLACEMENT (Bilateral)  SURGEON:  Surgeon(s) and Role:    * Alexis Frock, MD - Primary  PHYSICIAN ASSISTANT:   ASSISTANTS: none   ANESTHESIA:   general  EBL:  Total I/O In: 1000 [I.V.:1000] Out: -   BLOOD ADMINISTERED:none  DRAINS: none   LOCAL MEDICATIONS USED:  NONE  SPECIMEN:  Source of Specimen:  1 - scattered small bladder tumors, 2 - base of bladder tumors, 3 - are of Rt upper pole UO (tumor)  DISPOSITION OF SPECIMEN:  PATHOLOGY  COUNTS:  YES  TOURNIQUET:  * No tourniquets in log *  DICTATION: .Other Dictation: Dictation Number [id number]  PLAN OF CARE: Discharge to home after PACU  PATIENT DISPOSITION:  PACU - hemodynamically stable.   Delay start of Pharmacological VTE agent (>24hrs) due to surgical blood loss or risk of bleeding: not applicable

## 2015-04-14 NOTE — Anesthesia Procedure Notes (Signed)
Procedure Name: LMA Insertion Date/Time: 04/14/2015 10:53 AM Performed by: Wanita Chamberlain Pre-anesthesia Checklist: Patient identified, Timeout performed, Emergency Drugs available, Suction available and Patient being monitored Patient Re-evaluated:Patient Re-evaluated prior to inductionOxygen Delivery Method: Circle system utilized Preoxygenation: Pre-oxygenation with 100% oxygen Intubation Type: IV induction Ventilation: Mask ventilation without difficulty LMA: LMA inserted LMA Size: 4.0 Number of attempts: 1 Placement Confirmation: breath sounds checked- equal and bilateral and positive ETCO2 Tube secured with: Tape Dental Injury: Teeth and Oropharynx as per pre-operative assessment

## 2015-04-14 NOTE — H&P (Signed)
[name] is an 69 y.o. female.    Chief Complaint: Pre-OP Transurethral Resection of Bladder Tumor  HPI:       1 - Bladder Cancer - Massive volume multifocal bladder cancer by CT and cysto 2015 on eval for persistant dysuria and hematuria. Large trigone and left wall components. No hydro or pelvic adenopathy by CT Urogram.   Recent Course: 10/2013 - TaG1 with muscle in specimen by massive volume TURBT; 01/2014 reTURBT TaG1 with muscle in specimen 03/2014 - surve cysto NED (some posterior small erythema, favor healing);  06/2014 multifocal small volume recurrence, mostly posterior and dome --> 07/2014 TURBT + Mito C T1G1 with muscle in specimen --> induction BCG x6 10/2014 NED (some erythema, favor BCG effect, cystitis);  03/2015 multifocal recurrence of many small papillary tumors including Rt UO area.  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   PMH sig for TNA, foot surgery. NO CV disease. NO strong blood thinners. NO chest or abdominal surgeries. She gets primary care through [facility] on [address]  Today "[name]" is seen to proceed with TURBT  With retrogardes and right ureteroscopy for further management of recurrent bladder cancer.   Past Medical History  Diagnosis Date  . Wears contact lenses   . History of recurrent UTIs   . Bladder cancer ([place]) [place]  . Renal cyst, left     NON-COMPLEX  . Duplex kidney     RIGHT  . History of adenomatous polyp of colon   . Hyperlipidemia     Past Surgical History  Procedure Laterality Date  . Bunionectomy Right 2000  . Tonsillectomy  24   (age 62)  . Benign breast bx Right 1995  . Colonoscopy w/ polypectomy  01/06/2013    single sessile polyp ascending colon.  [facility].  Marland Kitchen Transurethral resection of bladder tumor with gyrus (turbt-gyrus) N/A 10/22/2013   Procedure: TRANSURETHRAL RESECTION OF BLADDER TUMOR WITH ERBE.;  Surgeon: Alexis Frock, MD;  Location: [facility];  Service: Urology;  Laterality: N/A;  . Cystoscopy w/ ureteral stent placement Bilateral 10/22/2013    Procedure: CYSTOSCOPY WITH RETROGRADE PYELOGRAM;  Surgeon: Alexis Frock, MD;  [facility];  Service: Urology;  Laterality: Bilateral;  . Transurethral resection of bladder tumor with gyrus (turbt-gyrus) N/A 12/10/2013    Procedure: RESTAGING TRANSURETHRAL RESECTION OF BLADDER TUMOR WITH GYRUS (TURBT-GYRUS);  Surgeon: Alexis Frock, MD;  Location: [facility];  Service: Urology;  Laterality: N/A;  . Cystoscopy w/ retrogrades Bilateral 12/10/2013    Procedure: RETROGRADE PYELOGRAM  ;  Surgeon: Alexis Frock, MD;  Location: [facility];  Service: Urology;  Laterality: Bilateral;  . Transurethral resection of bladder tumor with gyrus (turbt-gyrus) N/A 07/15/2014    Procedure: TRANSURETHRAL RESECTION OF BLADDER TUMOR WITH GYRUS (TURBT-GYRUS);  Surgeon: Alexis Frock, MD;  Location: [facility];  Service: Urology;  Laterality: N/A;  . Cystoscopy w/ retrogrades Bilateral 07/15/2014    Procedure: CYSTOSCOPY WITH RETROGRADE PYELOGRAM AND INSTILLATION OF MITOMYCIN C;  Surgeon: Alexis Frock, MD;  Location: [facility];  Service: Urology;  Laterality: Bilateral;    Family History  Problem Relation Age of Onset  . Hypertension Mother   . Arthritis Mother     knee replacement; shoulder replacement  . Heart disease Mother  AAA  . Cancer Father     prostate cancer  . Cancer Brother     prostate cancer  . Cancer Brother 50    Melanoma  . Mental illness Sister 17    bipolar disorder   Social History:  reports that she quit smoking about 2 years ago. Her smoking use included Cigarettes. She has a 3 pack-year smoking history. She has never used smokeless tobacco. She reports that she drinks  alcohol. She reports that she does not use illicit drugs.  Allergies:  Allergies  Allergen Reactions  . Hycodan [Hydrocodone-Homatropine] Hives    No prescriptions prior to admission    No results found for this or any previous visit (from the past 48 hour(s)). No results found.  Review of Systems  Constitutional: Negative.   HENT: Negative.   Eyes: Negative.   Respiratory: Negative.   Cardiovascular: Negative.   Gastrointestinal: Negative.   Genitourinary: Negative.  Negative for hematuria and flank pain.  Musculoskeletal: Negative.   Skin: Negative.   Neurological: Negative.   Endo/Heme/Allergies: Negative.   Psychiatric/Behavioral: Negative.     Height 5\' 11"  (1.803 m), weight 93.895 kg (207 lb). Physical Exam  Constitutional: She appears well-developed.  HENT:  Head: Normocephalic.  Eyes: Pupils are equal, round, and reactive to light.  Neck: Normal range of motion.  Respiratory: Effort normal.  GI: Soft.  Genitourinary:  NO CVAT  Musculoskeletal: Normal range of motion.  Neurological: She is alert.  Skin: Skin is warm.  Psychiatric: She has a normal mood and affect. Her behavior is normal. Judgment and thought content normal.     Assessment/Plan  1 - Bladder Cancer -  recurrent multifocal by most recent cysto. Rec TURBT, retrogrades, possible Rt ureteroscopy / stent tumor at Rt UO.   RIsks, benefits, alternatives discussed.   2 - Rt Duplex Kidney - no hydro to suggest functional abnormality. May impact bladder cancer treatment if needs future cystectomy as would require separate ureteral anastamoses.  [name] 04/14/2015, 7:58 AM

## 2015-04-14 NOTE — Anesthesia Preprocedure Evaluation (Addendum)
Anesthesia Evaluation  Patient identified by MRN, date of birth, ID band Patient awake    Reviewed: Allergy & Precautions, NPO status , Patient's Chart, lab work & pertinent test results  Airway Mallampati: I  TM Distance: >3 FB Neck ROM: Full    Dental  (+) Teeth Intact, Dental Advisory Given, Caps,    Pulmonary former smoker,    breath sounds clear to auscultation       Cardiovascular Exercise Tolerance: Good  Rhythm:Regular Rate:Normal     Neuro/Psych Anxiety    GI/Hepatic   Endo/Other    Renal/GU      Musculoskeletal   Abdominal   Peds  Hematology   Anesthesia Other Findings   Reproductive/Obstetrics                           Anesthesia Physical Anesthesia Plan  ASA: II  Anesthesia Plan: General   Post-op Pain Management:    Induction: Intravenous  Airway Management Planned: LMA  Additional Equipment:   Intra-op Plan:   Post-operative Plan: Extubation in OR  Informed Consent: I have reviewed the patients History and Physical, chart, labs and discussed the procedure including the risks, benefits and alternatives for the proposed anesthesia with the patient or authorized representative who has indicated his/her understanding and acceptance.   Dental advisory given  Plan Discussed with: CRNA, Anesthesiologist and Surgeon  Anesthesia Plan Comments:         Anesthesia Quick Evaluation

## 2015-04-14 NOTE — Transfer of Care (Signed)
Immediate Anesthesia Transfer of Care Note  Patient: [name]  Procedure(s) Performed: Procedure(s): TRANSURETHRAL RESECTION OF BLADDER TUMOR WITH GYRUS (TURBT-GYRUS) (N/A) CYSTOSCOPY WITH RETROGRADE PYELOGRAM, URETEROSCOPY AND RIGHT STENT PLACEMENT (Bilateral)  Patient Location: PACU  Anesthesia Type:General  Level of Consciousness: awake, alert , oriented and patient cooperative  Airway & Oxygen Therapy: Patient Spontanous Breathing and Patient connected to nasal cannula oxygen  Post-op Assessment: Report given to RN and Post -op Vital signs reviewed and stable  Post vital signs: Reviewed and stable  Last Vitals:  Filed Vitals:   04/14/15 0902  BP: 157/93  Pulse: 94  Temp: 37.1 C  Resp: 16    Complications: No apparent anesthesia complications

## 2015-04-14 NOTE — Discharge Instructions (Signed)
1 - You may have urinary urgency (bladder spasms) and bloody urine on / off with stent in place. This is normal.  2 - Call MD or go to ER for fever >102, severe pain / nausea / vomiting not relieved by medications, or acute change in medical status                                               Transurethral Procedure  Medications: Resume all your other meds from home  Activity: 1. No heavy lifting > 10 pounds for 2 weeks. 2. No sexual activity for 2 weeks. 3. No strenuous activity for 2 weeks. 4. No driving while on narcotic pain medications. 5. Drink plenty of water. 6. Continue to walk at home - you can still get blood clots when you are at home so keep active but don't over do it. 7. Your urine may have some blood in it - make sure you drink plenty of water. Call or come to the ER immediately if you catheter stops draining or you are unable to urinate.  Bathing: You can shower. You can take a bath unless you have a foley catheter in place.  Signs / Symptoms to call: 1. Call if you have a fever greater than 101.5  2. Uncontrolled nausea / vomiting, uncontrolled pain / dizziness, unable to urinate, leg swelling / leg pain, or any other concerns.       [facility] [phone] Post Ureteroscopy With or Without Stent Instructions  Definitions:  Ureter: The duct that transports urine from the kidney to the bladder. Stent:   A plastic hollow tube that is placed into the ureter, from the kidney to the bladder to prevent the ureter from swelling shut.  GENERAL INSTRUCTIONS:  Despite the fact that no skin incisions were used, the area around the ureter and bladder is raw and irritated. The stent is a foreign body which will further irritate the bladder wall. This irritation is manifested by increased frequency of urination, both day and night, and by an increase in the urge to urinate. In some, the urge to urinate is present almost always. Sometimes the urge is  strong enough that you may not be able to stop yourself from urinating. The only real cure is to remove the stent and then give time for the bladder wall to heal which can't be done until the danger of the ureter swelling shut has passed, which varies.  You may see some blood in your urine while the stent is in place and a few days afterwards. Do not be alarmed, even if the urine was clear for a while. Get off your feet and drink lots of fluids until clearing occurs. If you start to pass clots or don't improve, call us.  DIET: You may return to your normal diet immediately. Because of the raw surface of your bladder, alcohol, spicy foods, acid type foods and drinks with caffeine may cause irritation or frequency and should be used in moderation. To keep your urine flowing freely and to avoid constipation, drink plenty of fluids during the day ( 8-10 glasses ). Tip: Avoid cranberry juice because it is very acidic.  ACTIVITY: Your physical activity doesn't need to be restricted. However, if you are very active, you may see some blood in your urine. We suggest that you reduce your activity under  these circumstances until the bleeding has stopped.  BOWELS: It is important to keep your bowels regular during the postoperative period. Straining with bowel movements can cause bleeding. A bowel movement every other day is reasonable. Use a mild laxative if needed, such as Milk of Magnesia 2-3 tablespoons, or 2 Dulcolax tablets. Call if you continue to have problems. If you have been taking narcotics for pain, before, during or after your surgery, you may be constipated. Take a laxative if necessary.   MEDICATION: You should resume your pre-surgery medications unless told not to. In addition you will often be given an antibiotic to prevent infection. These should be taken as prescribed until the bottles are finished unless you are having an unusual reaction to one of the drugs.  PROBLEMS YOU SHOULD REPORT TO  [place]:  Fevers over 100.5 Fahrenheit.  Heavy bleeding, or clots ( See above notes about blood in urine ).  Inability to urinate.  Drug reactions ( hives, rash, nausea, vomiting, diarrhea ).  Severe burning or pain with urination that is not improving.  FOLLOW-UP: You will need a follow-up appointment to monitor your progress. Call for this appointment at the number listed above. Usually the first appointment will be about three to fourteen days after your surgery.     Post Anesthesia Home Care Instructions  Activity: Get plenty of rest for the remainder of the day. A responsible adult should stay with you for 24 hours following the procedure.  For the next 24 hours, DO NOT: -Drive a car -Paediatric nurse -Drink alcoholic beverages -Take any medication unless instructed by your physician -Make any legal decisions or sign important papers.  Meals: Start with liquid foods such as gelatin or soup. Progress to regular foods as tolerated. Avoid greasy, spicy, heavy foods. If nausea and/or vomiting occur, drink only clear liquids until the nausea and/or vomiting subsides. Call your physician if vomiting continues.  Special Instructions/Symptoms: Your throat may feel dry or sore from the anesthesia or the breathing tube placed in your throat during surgery. If this causes discomfort, gargle with warm salt water. The discomfort should disappear within 24 hours.  If you had a scopolamine patch placed behind your ear for the management of post- operative nausea and/or vomiting:  1. The medication in the patch is effective for 72 hours, after which it should be removed.  Wrap patch in a tissue and discard in the trash. Wash hands thoroughly with soap and water. 2. You may remove the patch earlier than 72 hours if you experience unpleasant side effects which may include dry mouth, dizziness or visual disturbances. 3. Avoid touching the patch. Wash your hands with soap and water after contact  with the patch.

## 2015-04-14 NOTE — Anesthesia Postprocedure Evaluation (Signed)
Anesthesia Post Note  Patient: [name]  Procedure(s) Performed: Procedure(s) (LRB): TRANSURETHRAL RESECTION OF BLADDER TUMOR WITH GYRUS (TURBT-GYRUS) (N/A) CYSTOSCOPY WITH RETROGRADE PYELOGRAM, URETEROSCOPY AND RIGHT STENT PLACEMENT (Bilateral)  Patient location during evaluation: PACU Anesthesia Type: General Level of consciousness: awake and alert Pain management: pain level controlled Vital Signs Assessment: post-procedure vital signs reviewed and stable Respiratory status: spontaneous breathing, nonlabored ventilation, respiratory function stable and patient connected to nasal cannula oxygen Cardiovascular status: blood pressure returned to baseline and stable Postop Assessment: no signs of nausea or vomiting Anesthetic complications: no    Last Vitals:  Filed Vitals:   04/14/15 0902 04/14/15 1153  BP: 157/93 145/81  Pulse: 94 82  Temp: 37.1 C 36.6 C  Resp: 16 15    Last Pain: There were no vitals filed for this visit.               Raylee Adamec A

## 2015-04-15 ENCOUNTER — Encounter (HOSPITAL_BASED_OUTPATIENT_CLINIC_OR_DEPARTMENT_OTHER): Payer: Self-pay | Admitting: Urology

## 2015-04-15 NOTE — Op Note (Signed)
NAME:  [name], [name]                ACCOUNT NO.:  MEDICAL RECORD NO.:  [id number]  LOCATION:                                 FACILITY:  PHYSICIAN:  Alexis Frock, MD          DATE OF BIRTH:  DATE OF PROCEDURE: 04/14/2015                               OPERATIVE REPORT  PREOPERATIVE DIAGNOSIS:  Recurrent bladder cancer.  PROCEDURE: 1. Cystoscopy with transurethral resection of bladder tumor, volume     medium. 2. Bilateral retrograde pyelogram interpretation. 3. Right diagnostic ureteroscopy. 4. Insertion of right ureteral stents x2, 5 x 26 Polaris, upper pole     moiety; 5 x 24 Polaris, lower pole moiety. No tethers.  ESTIMATED BLOOD LOSS:  Nil.  COMPLICATIONS:  None.  SPECIMEN: 1. Scattered small bladder tumors. 2. Base of bladder tumors. 3. Area of right upper pole ureteral orifice tumor. 4. Stent.  FINDINGS: 1. Multifocal papillary bladder tumors.  These were on the posterior     wall, dome, right wall, and very close to the right upper pole     moiety ureteral orifice.  Each tumor individually less than a     centimeter.  Total volume approximately 4 cm2. 2. Area of right upper pole ureteral orifice tumor with origin   Likely from bladder.  No obvious extension into the ureter on     diagnostic ureteroscopy. 3. Successful placement of right ureteral stent in upper and lower pole     moieties on the right.  INDICATIONS:  Ms. Takemura is a very pleasant 70 year old lady with history of recurrent bladder cancer.  She is status post prior endoscopic resection resection and induction BCG.  She was found on surveillance protocol to have a multifocal recurrence of papillary appearing tumors including one that was very near her right ureteral orifice.  She has known complete right duplication.  Options were discussed for management including transurethral resection with goal for diagnostic and staging and treatment purposes and she wished to proceed.  Informed consent  was obtained and placed in medical record.  PROCEDURE IN DETAIL:  The patient being [name] was verified. Procedure being transurethral resection of bladder tumor and right ureteroscopy was confirmed.  Procedure was carried out.  Time-out was performed.  Intravenous antibiotics were administered.  General LMA anesthesia was introduced.  The patient was placed into a low lithotomy position.  Sterile field was created by prepping and draping the patient's vagina, introitus, and proximal thighs using iodine x3.  Mild cystocele was noted.  Cystourethroscopy was performed using a 23-French rigid cystoscope with 30-degree offset lens.  Inspection of the urinary bladder revealed no diverticula, calcifications.  There were multifocal small bladder tumors in the dome, posterior wall and right wall and near the right ureteral orifice.  Each one of these was approximately 1 cm or so.  These were nonnodular.  Total volume approximately 4 cm2. Photodocumentation was performed.  Attention was directed to the retrograde pyelogram, first in the left.  The left ureteral orifice was cannulated with a 6-French end-hole catheter and left retrograde pyelogram was obtained.  Left retrograde pyelogram demonstrated single left ureter, single system left kidney.  No filling  defects or narrowing noted.  Similarly, right retrograde pyelogram was obtained via the lateral most ureteral orifice. Right retrograde pyelogram revealed right lower pole ureter without filling defects or narrowing.  A 0.038 Zip wire was advanced and set aside as a safety wire.  Notably, there was papillary tumor flopping in the area very close to the ureteral orifice on this side.  The more medial ureteral orifice was just immediately next to the tumor aforementioned was cannulated and retrograde pyelogram was obtained. Retrograde pyelography of the right upper pole moiety ureter revealed no evidence of filling defects in the  upper or distal ureter.  The small papillary tumor was much more in association with this ureteral orifice. A separate 0.038 Zip wire was advanced and set aside as a safety wire. An 8-French feeding tube was placed in the urinary bladder for pressure release.  Next, semi-rigid ureteroscopy was performed to the distal two- thirds of the right upper and lower pole moiety ureter respectively.  No evidence of papillary tumor was seen within the ureters themselves.  The papillary tumor was clearly originating in the bladder with just tumor flopping in front of the ureteral orifice, but did not involve it directly per se.  It was more closely adherent to the upper pole moiety ureter which is inferomedially.  Medium loop was used and systematic resection was performed of all visible papillary tumors and what appeared to be the superficial fibromuscular stroma.  The area of the tumor near the right ureteral orifice was also resected using exquisite care and cutting current only taking great care to avoid direct injury to the ureteral orifice which did not occur.  Photo documentation was performed.  Additional coagulation current was applied to all sites except for the ureteral orifice sites, resulted in excellent hemostasis.  These tumor fragments were set aside and labeled as such with the area of the ureteral orifice being separate.  Cold cup biopsy forceps were then taken of respective areas of base of bladder tumors.  Additional coagulation and current applied to the sites.  Following these maneuvers, hemostasis appeared excellent.  There was complete resolution of all papillary bladder tumors.  There was no evidence of perforation.  Given the dissection very near the right ureteral orifice, it was felt that interval stenting would be warranted.  As such, a 5 x 26 Polaris stent was placed at the upper pole moiety ureter on the right using fluoroscopic guidance and a 24-French on the lower  upper pole moiety ureter.  Bladder was emptied per cystoscope.  Procedure was then terminated.  The patient tolerated the procedure well and no immediate periprocedural complications.  The patient was taken to the postanesthesia care unit in stable condition.          ______________________________ Alexis Frock, MD     TM/MEDQ  D:  04/14/2015  T:  04/14/2015  Job:  [id number]

## 2015-04-19 DIAGNOSIS — M2012 Hallux valgus (acquired), left foot: Secondary | ICD-10-CM | POA: Diagnosis not present

## 2015-04-19 DIAGNOSIS — M25775 Osteophyte, left foot: Secondary | ICD-10-CM | POA: Diagnosis not present

## 2015-04-19 DIAGNOSIS — M2042 Other hammer toe(s) (acquired), left foot: Secondary | ICD-10-CM | POA: Diagnosis not present

## 2015-04-29 DIAGNOSIS — Q638 Other specified congenital malformations of kidney: Secondary | ICD-10-CM | POA: Diagnosis not present

## 2015-04-29 DIAGNOSIS — Z0000 Encounter for general adult medical examination without abnormal findings: Secondary | ICD-10-CM | POA: Diagnosis not present

## 2015-04-29 DIAGNOSIS — C675 Malignant neoplasm of bladder neck: Secondary | ICD-10-CM | POA: Diagnosis not present

## 2015-04-29 DIAGNOSIS — R310 Gross hematuria: Secondary | ICD-10-CM | POA: Diagnosis not present

## 2015-06-01 DIAGNOSIS — N6001 Solitary cyst of right breast: Secondary | ICD-10-CM | POA: Diagnosis not present

## 2015-07-29 DIAGNOSIS — Q638 Other specified congenital malformations of kidney: Secondary | ICD-10-CM | POA: Diagnosis not present

## 2015-07-29 DIAGNOSIS — C675 Malignant neoplasm of bladder neck: Secondary | ICD-10-CM | POA: Diagnosis not present

## 2015-07-29 DIAGNOSIS — Z0000 Encounter for general adult medical examination without abnormal findings: Secondary | ICD-10-CM | POA: Diagnosis not present

## 2015-07-29 DIAGNOSIS — N281 Cyst of kidney, acquired: Secondary | ICD-10-CM | POA: Diagnosis not present

## 2015-08-23 ENCOUNTER — Ambulatory Visit (INDEPENDENT_AMBULATORY_CARE_PROVIDER_SITE_OTHER): Payer: Medicare Other | Admitting: Internal Medicine

## 2015-08-23 VITALS — BP 130/78 | HR 92 | Temp 98.9°F | Resp 16 | Ht 71.0 in | Wt 215.2 lb

## 2015-08-23 DIAGNOSIS — R059 Cough, unspecified: Secondary | ICD-10-CM

## 2015-08-23 DIAGNOSIS — R300 Dysuria: Secondary | ICD-10-CM | POA: Diagnosis not present

## 2015-08-23 DIAGNOSIS — R05 Cough: Secondary | ICD-10-CM

## 2015-08-23 LAB — POCT URINALYSIS DIP (MANUAL ENTRY)
BILIRUBIN UA: NEGATIVE
BILIRUBIN UA: NEGATIVE
Blood, UA: NEGATIVE
GLUCOSE UA: NEGATIVE
LEUKOCYTES UA: NEGATIVE
Nitrite, UA: NEGATIVE
Spec Grav, UA: 1.015
Urobilinogen, UA: 0.2
pH, UA: 7.5

## 2015-08-23 LAB — POC MICROSCOPIC URINALYSIS (UMFC): Mucus: ABSENT

## 2015-08-23 NOTE — Patient Instructions (Signed)
     IF you received an x-ray today, you will receive an invoice from [facility]. Please contact [facility] at [phone] with questions or concerns regarding your invoice.   IF you received labwork today, you will receive an invoice from [facility]. Please contact [facility] at [phone] with questions or concerns regarding your invoice.   Our billing staff will not be able to assist you with questions regarding bills from these companies.  You will be contacted with the lab results as soon as they are available. The fastest way to get your results is to activate your My Chart account. Instructions are located on the last page of this paperwork. If you have not heard from us regarding the results in 2 weeks, please contact this office.      

## 2015-08-23 NOTE — Progress Notes (Signed)
Subjective:  By signing my name below, I, [name], attest that this documentation has been prepared under the direction and in the presence of Tami Lin, MD. Electronically Signed: Moises Barton, [place]. 08/23/2015 , 6:02 PM .  Patient was seen in Room 4 .   Patient ID: [name], female    DOB: 19-Aug-1945, 70 y.o.   MRN: [id number] Chief Complaint  Patient presents with  . Cough    chest congestion and fatigue x 3 days    HPI [name] is a 70 y.o. female who presents to [facility] complaining of intermittent productive cough with chest congestion and feeling fatigue for the past 3 days. She mentions having some sore throat in the beginning. She also notes coughing at night, making it difficult to sleep. She denies nasal congestion or fever. She informs having her teeth cleaned a week ago.   She wants to make sure she's not contagious because she has family coming in to visit in about 2 weeks.   She has a h/o UTI and had bladder cancer 2 years ago.   Patient Active Problem List   Diagnosis Date Noted  . Pure hypercholesterolemia 12/25/2014  . Hx of bladder cancer 03/02/2014  . Bladder cancer ([place]) 09/29/2013   No current outpatient prescriptions on file. Allergies  Allergen Reactions  . Hycodan [Hydrocodone-Homatropine] Hives   Review of Systems  Constitutional: Positive for fatigue. Negative for fever and chills.  HENT: Positive for congestion and sore throat. Negative for rhinorrhea.   Respiratory: Positive for cough. Negative for shortness of breath and wheezing.   Gastrointestinal: Negative for nausea, vomiting, abdominal pain and diarrhea.      Objective:   Physical Exam  Constitutional: She is oriented to person, place, and time. She appears well-developed and well-nourished. No distress.  HENT:  Head: Normocephalic and atraumatic.  Right Ear: External ear normal.  Left Ear: External ear normal.  Nose: Nose normal.  Mouth/Throat:  Oropharynx is clear and moist. No oropharyngeal exudate.  Eyes: Conjunctivae and EOM are normal. Pupils are equal, round, and reactive to light.  Neck: Neck supple. No thyromegaly present.  Cardiovascular: Normal rate.   Pulmonary/Chest: Effort normal and breath sounds normal. No respiratory distress. She has no wheezes. She has no rales.  Musculoskeletal: Normal range of motion.  Lymphadenopathy:    She has no cervical adenopathy.  Neurological: She is alert and oriented to person, place, and time.  Skin: Skin is warm and dry.  Psychiatric: She has a normal mood and affect. Her behavior is normal.  Nursing note and vitals reviewed.   BP 130/78 mmHg  Pulse 92  Temp(Src) 98.9 F (37.2 C) (Oral)  Resp 16  Ht 5\' 11"  (1.803 m)  Wt 215 lb 3.2 oz (97.614 kg)  BMI 30.03 kg/m2  SpO2 94%   Results for orders placed or performed in visit on 08/23/15  POCT Microscopic Urinalysis ([facility])  Result Value Ref Range   WBC,UR,HPF,POC Few (A) None WBC/hpf   RBC,UR,HPF,POC Few (A) None RBC/hpf   Bacteria Few (A) None, Too numerous to count   Mucus Absent Absent   Epithelial Cells, UR Per Microscopy Few (A) None, Too numerous to count cells/hpf  POCT urinalysis dipstick  Result Value Ref Range   Color, UA yellow yellow   Clarity, UA clear clear   Glucose, UA negative negative   Bilirubin, UA negative negative   Ketones, POC UA negative negative   Spec Grav, UA 1.015    Barton,  UA negative negative   pH, UA 7.5    Protein Ur, POC trace (A) negative   Urobilinogen, UA 0.2    Nitrite, UA Negative Negative   Leukocytes, UA Negative Negative      Assessment & Plan:  I have completed the patient encounter in its entirety as documented by the scribe, with editing by me where necessary. Maragret Vanacker P. Laney Pastor, M.D.  Cough-viral uri  Dysuria - minimal-reassured  otc meds

## 2015-10-02 ENCOUNTER — Ambulatory Visit (INDEPENDENT_AMBULATORY_CARE_PROVIDER_SITE_OTHER): Payer: Medicare Other | Admitting: Family Medicine

## 2015-10-02 VITALS — BP 142/70 | HR 109 | Temp 97.7°F | Resp 16 | Ht 68.0 in | Wt 212.0 lb

## 2015-10-02 DIAGNOSIS — R300 Dysuria: Secondary | ICD-10-CM

## 2015-10-02 DIAGNOSIS — Z8551 Personal history of malignant neoplasm of bladder: Secondary | ICD-10-CM | POA: Diagnosis not present

## 2015-10-02 LAB — POCT URINALYSIS DIP (MANUAL ENTRY)
Bilirubin, UA: NEGATIVE
GLUCOSE UA: NEGATIVE
NITRITE UA: NEGATIVE
Spec Grav, UA: 1.015
UROBILINOGEN UA: 0.2
pH, UA: 8.5

## 2015-10-02 LAB — POC MICROSCOPIC URINALYSIS (UMFC)

## 2015-10-02 MED ORDER — SULFAMETHOXAZOLE-TRIMETHOPRIM 800-160 MG PO TABS: 1.0000 | ORAL_TABLET | Freq: Two times a day (BID) | ORAL | Status: DC

## 2015-10-02 MED ORDER — PHENAZOPYRIDINE HCL 100 MG PO TABS: 100.0000 mg | ORAL_TABLET | Freq: Three times a day (TID) | ORAL | Status: DC | PRN

## 2015-10-02 NOTE — Patient Instructions (Addendum)
   IF you received an x-ray today, you will receive an invoice from [facility]. Please contact [facility] at [phone] with questions or concerns regarding your invoice.   IF you received labwork today, you will receive an invoice from [facility]. Please contact [facility] at [phone] with questions or concerns regarding your invoice.   Our billing staff will not be able to assist you with questions regarding bills from these companies.  You will be contacted with the lab results as soon as they are available. The fastest way to get your results is to activate your My Chart account. Instructions are located on the last page of this paperwork. If you have not heard from us regarding the results in 2 weeks, please contact this office.     Urinary Tract Infection Urinary tract infections (UTIs) can develop anywhere along your urinary tract. Your urinary tract is your body's drainage system for removing wastes and extra water. Your urinary tract includes two kidneys, two ureters, a bladder, and a urethra. Your kidneys are a pair of bean-shaped organs. Each kidney is about the size of your fist. They are located below your ribs, one on each side of your spine. CAUSES Infections are caused by microbes, which are microscopic organisms, including fungi, viruses, and bacteria. These organisms are so small that they can only be seen through a microscope. Bacteria are the microbes that most commonly cause UTIs. SYMPTOMS  Symptoms of UTIs may vary by age and gender of the patient and by the location of the infection. Symptoms in young women typically include a frequent and intense urge to urinate and a painful, burning feeling in the bladder or urethra during urination. Older women and men are more likely to be tired, shaky, and weak and have muscle aches and abdominal pain. A fever may mean the infection is in your kidneys. Other symptoms of a kidney  infection include pain in your back or sides below the ribs, nausea, and vomiting. DIAGNOSIS To diagnose a UTI, your caregiver will ask you about your symptoms. Your caregiver will also ask you to provide a urine sample. The urine sample will be tested for bacteria and white blood cells. White blood cells are made by your body to help fight infection. TREATMENT  Typically, UTIs can be treated with medication. Because most UTIs are caused by a bacterial infection, they usually can be treated with the use of antibiotics. The choice of antibiotic and length of treatment depend on your symptoms and the type of bacteria causing your infection. HOME CARE INSTRUCTIONS  If you were prescribed antibiotics, take them exactly as your caregiver instructs you. Finish the medication even if you feel better after you have only taken some of the medication.  Drink enough water and fluids to keep your urine clear or pale yellow.  Avoid caffeine, tea, and carbonated beverages. They tend to irritate your bladder.  Empty your bladder often. Avoid holding urine for long periods of time.  Empty your bladder before and after sexual intercourse.  After a bowel movement, women should cleanse from front to back. Use each tissue only once. SEEK MEDICAL CARE IF:   You have back pain.  You develop a fever.  Your symptoms do not begin to resolve within 3 days. SEEK IMMEDIATE MEDICAL CARE IF:   You have severe back pain or lower abdominal pain.  You develop chills.  You have nausea or vomiting.  You have continued burning or discomfort with urination.   MAKE SURE YOU:   Understand these instructions.  Will watch your condition.  Will get help right away if you are not doing well or get worse.   This information is not intended to replace advice given to you by your health care provider. Make sure you discuss any questions you have with your health care provider.   Document Released: 01/04/2005 Document  Revised: 12/16/2014 Document Reviewed: 05/05/2011 Elsevier Interactive Patient Education 2016 [employer]  

## 2015-10-02 NOTE — Progress Notes (Signed)
Subjective:    Patient ID: [name], female    DOB: 17-Jun-1945, 70 y.o.   MRN: [id number]  10/02/2015  Urinary Tract Infection   HPI This 70 y.o. female presents for evaluation of dysuria. Onset one week ago. No fever/chills/sweats.  +dysuria; +urgency, +frequency. No hematuria. +nocturia has worsened. No flank pain. No n/v.  No abdominal pain.  No vaginal discharge or odor.  History of bladder cancer.   Review of Systems  Constitutional: Negative for fever, chills, diaphoresis and fatigue.  Gastrointestinal: Negative for nausea, vomiting, abdominal pain, diarrhea, constipation, blood in stool, abdominal distention, anal bleeding and rectal pain.  Genitourinary: Positive for dysuria, urgency and frequency. Negative for hematuria, flank pain, decreased urine volume, vaginal bleeding, vaginal discharge, enuresis, genital sores, vaginal pain, menstrual problem and pelvic pain.    Past Medical History  Diagnosis Date  . Wears contact lenses   . History of recurrent UTIs   . Bladder cancer ([place]) [place]  . Renal cyst, left     NON-COMPLEX  . Duplex kidney     RIGHT  . History of adenomatous polyp of colon   . Hyperlipidemia    Past Surgical History  Procedure Laterality Date  . Bunionectomy Right 2000  . Tonsillectomy  65   (age 66)  . Benign breast bx Right 1995  . Colonoscopy w/ polypectomy  01/06/2013    single sessile polyp ascending colon.  [facility].  Marland Kitchen Transurethral resection of bladder tumor with gyrus (turbt-gyrus) N/A 10/22/2013    Procedure: TRANSURETHRAL RESECTION OF BLADDER TUMOR WITH ERBE.;  Surgeon: Alexis Frock, MD;  Location: [facility];  Service: Urology;  Laterality: N/A;  . Cystoscopy w/ ureteral stent placement Bilateral 10/22/2013    Procedure: CYSTOSCOPY WITH RETROGRADE PYELOGRAM;  Surgeon: Alexis Frock, MD;  Location: [facility];  Service: Urology;  Laterality: Bilateral;  .  Transurethral resection of bladder tumor with gyrus (turbt-gyrus) N/A 12/10/2013    Procedure: RESTAGING TRANSURETHRAL RESECTION OF BLADDER TUMOR WITH GYRUS (TURBT-GYRUS);  Surgeon: Alexis Frock, MD;  Location: [facility];  Service: Urology;  Laterality: N/A;  . Cystoscopy w/ retrogrades Bilateral 12/10/2013    Procedure: RETROGRADE PYELOGRAM  ;  Surgeon: Alexis Frock, MD;  Location: [facility];  Service: Urology;  Laterality: Bilateral;  . Transurethral resection of bladder tumor with gyrus (turbt-gyrus) N/A 07/15/2014    Procedure: TRANSURETHRAL RESECTION OF BLADDER TUMOR WITH GYRUS (TURBT-GYRUS);  Surgeon: Alexis Frock, MD;  Location: [facility];  Service: Urology;  Laterality: N/A;  . Cystoscopy w/ retrogrades Bilateral 07/15/2014    Procedure: CYSTOSCOPY WITH RETROGRADE PYELOGRAM AND INSTILLATION OF MITOMYCIN C;  Surgeon: Alexis Frock, MD;  Location[facility];  Service: Urology;  Laterality: Bilateral;  . Transurethral resection of bladder tumor with gyrus (turbt-gyrus) N/A 04/14/2015    Procedure: TRANSURETHRAL RESECTION OF BLADDER TUMOR WITH GYRUS (TURBT-GYRUS);  Surgeon: Alexis Frock, MD;  Location: [facility];  Service: Urology;  Laterality: N/A;  . Cystoscopy with retrograde pyelogram, ureteroscopy and stent placement Bilateral 04/14/2015    Procedure: CYSTOSCOPY WITH RETROGRADE PYELOGRAM, URETEROSCOPY AND RIGHT STENT PLACEMENT;  Surgeon: Alexis Frock, MD;  Location: [facility];  Service: Urology;  Laterality: Bilateral;   Allergies  Allergen Reactions  . Hycodan [Hydrocodone-Homatropine] Hives    Social History   Social History  . Marital Status: Divorced    Spouse Name: N/A  . Number of  Children: 3  . Years of Education: N/A   Occupational History  . 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   Social History Main Topics  . Smoking status: Former  Smoker -- 0.15 packs/day for 20 years    Types: Cigarettes    Quit date: 10/17/2012  . Smokeless tobacco: Never Used  . Alcohol Use: Yes     Comment: OCCASIONAL  . Drug Use: No  . Sexual Activity: Not Currently    Birth Control/ Protection: Post-menopausal   Other Topics Concern  . Not on file   Social History Narrative   Marital status:  Divorced; not dating.      Children:  3 children; 2 grandchildren; family within walking distance.        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      Tobacco:  None; quit in 09/2012.      Alcohol: 1 drink per week.       Drugs:  None      Exercise:  One hour of water aerobics 3-4 days per week.      Seatbelt: 100%      Guns:  none      ADLs: independent with all ADLs; drives; no assistant device.      Advanced Directives:  Yes; FULL CODE; no prolonged measures.   Family History  Problem Relation Age of Onset  . Hypertension Mother   . Arthritis Mother     knee replacement; shoulder replacement  . Heart disease Mother     AAA  . Cancer Father     prostate cancer  . Cancer Brother     prostate cancer  . Cancer Brother 50    Melanoma  . Mental illness Sister 14    bipolar disorder       Objective:    BP 142/70 mmHg  Pulse 109  Temp(Src) 97.7 F (36.5 C) (Oral)  Resp 16  Ht 5\' 8"  (1.727 m)  Wt 212 lb (96.163 kg)  BMI 32.24 kg/m2  SpO2 96% Physical Exam  Constitutional: She is oriented to person, place, and time. She appears well-developed and well-nourished. No distress.  HENT:  Head: Normocephalic and atraumatic.  Eyes: Conjunctivae are normal. Pupils are equal, round, and reactive to light.  Neck: Normal range of motion. Neck supple.  Cardiovascular: Normal rate, regular rhythm and normal heart sounds.  Exam reveals no gallop and no friction rub.   No murmur heard. Pulmonary/Chest: Effort normal and breath sounds normal. She has no wheezes. She has no rales.    Abdominal: Soft. Bowel sounds are normal. She exhibits no distension and no mass. There is no tenderness. There is no rebound, no guarding and no CVA tenderness.  Neurological: She is alert and oriented to person, place, and time.  Skin: She is not diaphoretic.  Psychiatric: She has a normal mood and affect. Her behavior is normal.  Nursing note and vitals reviewed.       Assessment & Plan:   1. Dysuria   2. Hx of bladder cancer     Orders Placed This Encounter  Procedures  . Urine culture  . POCT urinalysis dipstick  . POCT Microscopic Urinalysis ([facility])   Meds ordered this encounter  Medications  . sulfamethoxazole-trimethoprim (BACTRIM DS,SEPTRA DS) 800-160 MG tablet    Sig: Take 1 tablet by mouth 2 (two) times daily.    Dispense:  14  tablet    Refill:  0  . phenazopyridine (PYRIDIUM) 100 MG tablet    Sig: Take 1 tablet (100 mg total) by mouth 3 (three) times daily as needed for pain.    Dispense:  6 tablet    Refill:  0    No Follow-up on file.    Libbey Duce Elayne Guerin, M.D. [facility] [address] [place], [place]  [address] [phone] phone [phone] fax

## 2015-10-05 LAB — URINE CULTURE: Colony Count: >=100000

## 2015-12-07 ENCOUNTER — Other Ambulatory Visit: Payer: Self-pay | Admitting: Urology

## 2015-12-07 DIAGNOSIS — C675 Malignant neoplasm of bladder neck: Secondary | ICD-10-CM | POA: Diagnosis not present

## 2015-12-07 DIAGNOSIS — R310 Gross hematuria: Secondary | ICD-10-CM | POA: Diagnosis not present

## 2015-12-07 DIAGNOSIS — N281 Cyst of kidney, acquired: Secondary | ICD-10-CM | POA: Diagnosis not present

## 2015-12-07 DIAGNOSIS — N3020 Other chronic cystitis without hematuria: Secondary | ICD-10-CM | POA: Diagnosis not present

## 2015-12-14 ENCOUNTER — Ambulatory Visit: Payer: Medicare Other | Admitting: Podiatry

## 2015-12-21 ENCOUNTER — Encounter: Payer: Medicare Other | Admitting: Family Medicine

## 2015-12-27 ENCOUNTER — Encounter: Payer: Medicare Other | Admitting: Family Medicine

## 2015-12-28 ENCOUNTER — Encounter: Payer: Self-pay | Admitting: Family Medicine

## 2015-12-28 ENCOUNTER — Encounter (HOSPITAL_BASED_OUTPATIENT_CLINIC_OR_DEPARTMENT_OTHER): Payer: Self-pay | Admitting: *Deleted

## 2015-12-28 ENCOUNTER — Other Ambulatory Visit: Payer: Self-pay | Admitting: Dermatology

## 2015-12-28 ENCOUNTER — Ambulatory Visit (INDEPENDENT_AMBULATORY_CARE_PROVIDER_SITE_OTHER): Payer: Medicare Other | Admitting: Family Medicine

## 2015-12-28 VITALS — BP 124/80 | HR 83 | Temp 98.1°F | Resp 18 | Ht 68.0 in | Wt 216.0 lb

## 2015-12-28 DIAGNOSIS — Z23 Encounter for immunization: Secondary | ICD-10-CM | POA: Diagnosis not present

## 2015-12-28 DIAGNOSIS — Z0000 Encounter for general adult medical examination without abnormal findings: Secondary | ICD-10-CM

## 2015-12-28 DIAGNOSIS — Z8551 Personal history of malignant neoplasm of bladder: Secondary | ICD-10-CM | POA: Diagnosis not present

## 2015-12-28 DIAGNOSIS — L821 Other seborrheic keratosis: Secondary | ICD-10-CM | POA: Diagnosis not present

## 2015-12-28 DIAGNOSIS — L820 Inflamed seborrheic keratosis: Secondary | ICD-10-CM | POA: Diagnosis not present

## 2015-12-28 DIAGNOSIS — E7800 Pure hypercholesterolemia, unspecified: Secondary | ICD-10-CM | POA: Diagnosis not present

## 2015-12-28 DIAGNOSIS — D485 Neoplasm of uncertain behavior of skin: Secondary | ICD-10-CM | POA: Diagnosis not present

## 2015-12-28 DIAGNOSIS — D239 Other benign neoplasm of skin, unspecified: Secondary | ICD-10-CM | POA: Diagnosis not present

## 2015-12-28 LAB — CBC WITH DIFFERENTIAL/PLATELET
BASOS ABS: 0 {cells}/uL (ref 0–200)
BASOS PCT: 0 %
EOS PCT: 3 %
Eosinophils Absolute: 150 cells/uL (ref 15–500)
HCT: 41.4 % (ref 35.0–45.0)
Hemoglobin: 13.9 g/dL (ref 11.7–15.5)
LYMPHS PCT: 37 %
Lymphs Abs: 1850 cells/uL (ref 850–3900)
MCH: 31.1 pg (ref 27.0–33.0)
MCHC: 33.6 g/dL (ref 32.0–36.0)
MCV: 92.6 fL (ref 80.0–100.0)
MONOS PCT: 9 %
MPV: 9.4 fL (ref 7.5–12.5)
Monocytes Absolute: 450 cells/uL (ref 200–950)
NEUTROS ABS: 2550 {cells}/uL (ref 1500–7800)
Neutrophils Relative %: 51 %
PLATELETS: 312 10*3/uL (ref 140–400)
RBC: 4.47 MIL/uL (ref 3.80–5.10)
RDW: 13.2 % (ref 11.0–15.0)
WBC: 5 10*3/uL (ref 3.8–10.8)

## 2015-12-28 LAB — POCT URINALYSIS DIP (MANUAL ENTRY)
BILIRUBIN UA: NEGATIVE
BILIRUBIN UA: NEGATIVE
Blood, UA: NEGATIVE
GLUCOSE UA: NEGATIVE
Leukocytes, UA: NEGATIVE
Nitrite, UA: NEGATIVE
SPEC GRAV UA: 1.015
Urobilinogen, UA: 0.2
pH, UA: 7.5

## 2015-12-28 LAB — COMPREHENSIVE METABOLIC PANEL
ALT: 39 U/L — ABNORMAL HIGH (ref 6–29)
AST: 33 U/L (ref 10–35)
Albumin: 4.4 g/dL (ref 3.6–5.1)
Alkaline Phosphatase: 74 U/L (ref 33–130)
BUN: 9 mg/dL (ref 7–25)
CHLORIDE: 107 mmol/L (ref 98–110)
CO2: 24 mmol/L (ref 20–31)
CREATININE: 0.86 mg/dL (ref 0.50–0.99)
Calcium: 9.3 mg/dL (ref 8.6–10.4)
GLUCOSE: 104 mg/dL — AB (ref 65–99)
Potassium: 4.4 mmol/L (ref 3.5–5.3)
SODIUM: 139 mmol/L (ref 135–146)
TOTAL PROTEIN: 7.1 g/dL (ref 6.1–8.1)
Total Bilirubin: 0.8 mg/dL (ref 0.2–1.2)

## 2015-12-28 LAB — POC MICROSCOPIC URINALYSIS (UMFC): MUCUS RE: ABSENT

## 2015-12-28 LAB — LIPID PANEL
CHOL/HDL RATIO: 5.4 ratio — AB (ref <=5.0)
CHOLESTEROL: 248 mg/dL — AB (ref 125–200)
HDL: 46 mg/dL (ref >=46)
LDL CALC: 161 mg/dL — AB (ref <130)
Triglycerides: 204 mg/dL — ABNORMAL HIGH (ref <150)
VLDL: 41 mg/dL — AB (ref <30)

## 2015-12-28 MED ORDER — ZOSTER VACCINE LIVE 19400 UNT/0.65ML ~~LOC~~ SUSR: 0.6500 mL | Freq: Once | SUBCUTANEOUS | 0 refills | Status: DC

## 2015-12-28 NOTE — Patient Instructions (Addendum)
   IF you received an x-ray today, you will receive an invoice from [facility]. Please contact [facility] at [phone] with questions or concerns regarding your invoice.   IF you received labwork today, you will receive an invoice from [facility]. Please contact [facility] at [phone] with questions or concerns regarding your invoice.   Our billing staff will not be able to assist you with questions regarding bills from these companies.  You will be contacted with the lab results as soon as they are available. The fastest way to get your results is to activate your My Chart account. Instructions are located on the last page of this paperwork. If you have not heard from us regarding the results in 2 weeks, please contact this office.    Keeping You Healthy  Get These Tests  Blood Pressure- Have your blood pressure checked by your healthcare provider at least once a year.  Normal blood pressure is 120/80.  Weight- Have your body mass index (BMI) calculated to screen for obesity.  BMI is a measure of body fat based on height and weight.  You can calculate your own BMI at www.nhlbisupport.com/bmi/  Cholesterol- Have your cholesterol checked every year.  Diabetes- Have your blood sugar checked every year if you have high blood pressure, high cholesterol, a family history of diabetes or if you are overweight.  Pap Test - Have a pap test every 1 to 5 years if you have been sexually active.  If you are older than 65 and recent pap tests have been normal you may not need additional pap tests.  In addition, if you have had a hysterectomy  for benign disease additional pap tests are not necessary.  Mammogram-Yearly mammograms are essential for early detection of breast cancer  Screening for Colon Cancer- Colonoscopy starting at age 50. Screening may begin sooner depending on your family history and other health conditions.  Follow up colonoscopy  as directed by your Gastroenterologist.  Screening for Osteoporosis- Screening begins at age 65 with bone density scanning, sooner if you are at higher risk for developing Osteoporosis.  Get these medicines  Calcium with Vitamin D- Your body requires 1200-1500 mg of Calcium a day and 800-1000 IU of Vitamin D a day.  You can only absorb 500 mg of Calcium at a time therefore Calcium must be taken in 2 or 3 separate doses throughout the day.  Hormones- Hormone therapy has been associated with increased risk for certain cancers and heart disease.  Talk to your healthcare provider about if you need relief from menopausal symptoms.  Aspirin- Ask your healthcare provider about taking Aspirin to prevent Heart Disease and Stroke.  Get these Immuniztions  Flu shot- Every fall  Pneumonia shot- Once after the age of 65; if you are younger ask your healthcare provider if you need a pneumonia shot.  Tetanus- Every ten years.  Zostavax- Once after the age of 60 to prevent shingles.  Take these steps  Don't smoke- Your healthcare provider can help you quit. For tips on how to quit, ask your healthcare provider or go to www.smokefree.gov or call 1-800 QUIT-NOW.  Be physically active- Exercise 5 days a week for a minimum of 30 minutes.  If you are not already physically active, start slow and gradually work up to 30 minutes of moderate physical activity.  Try walking, dancing, bike riding, swimming, etc.  Eat a healthy diet- Eat a variety of healthy foods such as fruits, vegetables, whole   grains, low fat milk, low fat cheeses, yogurt, lean meats, chicken, fish, eggs, dried beans, tofu, etc.  For more information go to www.thenutritionsource.org  Dental visit- Brush and floss teeth twice daily; visit your dentist twice a year.  Eye exam- Visit your Optometrist or Ophthalmologist yearly.  Drink alcohol in moderation- Limit alcohol intake to one drink or less a day.  Never drink and  drive.  Depression- Your emotional health is as important as your physical health.  If you're feeling down or losing interest in things you normally enjoy, please talk to your healthcare provider.  Seat Belts- can save your life; always wear one  Smoke/Carbon Monoxide detectors- These detectors need to be installed on the appropriate level of your home.  Replace batteries at least once a year.  Violence- If anyone is threatening or hurting you, please tell your healthcare provider.  Living Will/ Health care power of attorney- Discuss with your healthcare provider and family. 

## 2015-12-28 NOTE — Progress Notes (Addendum)
By signing my name below, I, Mesha Guinyard, attest that this documentation has been prepared under the direction and in the presence of Reginia Forts, MD.  Electronically Signed: Verlee Monte, Medical Scribe. 01/28/16. 8:38 AM.  Subjective:    Patient ID: [name], female    DOB: December 12, 1945, 70 y.o.   MRN: [id number]   12/28/2015  Annual Exam (CPE)  HPI  HPI Comments: [name] is a 70 y.o. female with a PMHx of bladder CA, and hypercholesterolemia who presents to the [facility] for her annual physical exam. Pt is not taking statins and is trying to control her cholesterol with diet and exercise. Pt denies heartburn, N/V/D, constipation, and abdominal pain.  Cancer Screening: Breast CA: Pt's mammogram was last done in the past 6 months. Cervical/Blader CA: Pap smear last done in 2015. Pt is scheduled for a procedure next Monday for her reoccuring bladder CA. Pt states it will be an outpt procedure. Pt is going to her urologist every 3 months. Pt doesn't wake up in the middle of the night to use the bathroom. Pt denies leaking urine in the day, denies URINARY INCONTINENCE.  +intermittent stress incontinence with coughing or sneezing. Skin CA: Pt has an dermatologist appt this morning. Colon CA: Colonoscopy last done in 2014, repeat in 5 years.  Podiatry: Pt has an appt next week for the bunion on her left foot.  Vision/Hearing: Pt has a annual appt. Pt states her hearing is fine. Pt denies tinnitus, HA, and dizziness  Visual Acuity Screening   Right eye Left eye Both eyes  Without correction:     With correction: 20/15 20/15 20/13    Dentist: Pt has a biannual appt.  Exercise: Pt does water aerobics at 6:15am 3-4 times a week and she's trying to get more exercise done.  Cardio: Pt has always had palpitations but hasn't experienced anything new. Pt denies chest pain, SOB, and coughing  OSA: Pt thinks she snores, but wakes up feeling good  in the morning. Pt occasionally takes naps since she wakes up at 4am every day and she gets sleepy when she takes care of her grandkids on Sundays.  Immunizations: Pt would like to get her shingles and flu shot. Pt isn't sure when her last tetanus shot was done. Immunization History  Administered Date(s) Administered  . Influenza,inj,Quad PF,36+ Mos 03/02/2014, 12/25/2014, 12/28/2015  . Pneumococcal Conjugate-13 03/02/2014  . Pneumococcal Polysaccharide-23 12/25/2014   Depression: Pt has support from her daughters with her battle with bladder CA Depression screen [facility] 2/9 12/28/2015 10/02/2015 08/23/2015 12/25/2014 03/02/2014  Decreased Interest 0 0 0 0 0  Down, Depressed, Hopeless 0 0 0 0 0  PHQ - 2 Score 0 0 0 0 0   Advance Directive: Pt does have a living will and would like to get CPR if her heart stops pumping. Pt would like to be put on a ventilator when she gets to the hospital.  Fall Screening:  Fall Risk  12/28/2015 10/02/2015 08/23/2015 12/25/2014 03/02/2014  Falls in the past year? No No No No No   Functional Status Survey: Is the patient deaf or have difficulty hearing?: No Does the patient have difficulty seeing, even when wearing glasses/contacts?: No Does the patient have difficulty concentrating, remembering, or making decisions?: No Does the patient have difficulty walking or climbing stairs?: No Does the patient have difficulty dressing or bathing?: No Does the patient have difficulty doing errands alone such as visiting a doctor's office  or shopping?: No  Hep C Screening: Done 8/17 without antibodies found.  Family History: Dad: Having trouble swallowing due to his esophagus not working; uses a Environmental consultant. Mom: limited mobility and uses a walker  Social History: Pt has a grandchild that is born 6/26. Pt drinks 1-2 glasses of wine a day. 2 of the pt's grandkids are 3.5 y/o and the other grandkid is a newborn.  Review of Systems  Constitutional: Negative for activity change,  appetite change, chills, diaphoresis, fatigue, fever and unexpected weight change.  HENT: Negative for congestion, dental problem, drooling, ear discharge, ear pain, facial swelling, hearing loss, mouth sores, nosebleeds, postnasal drip, rhinorrhea, sinus pressure, sneezing, sore throat, tinnitus, trouble swallowing and voice change.   Eyes: Negative for photophobia, pain, discharge, redness, itching and visual disturbance.  Respiratory: Negative for apnea, cough, choking, chest tightness, shortness of breath, wheezing and stridor.   Cardiovascular: Negative for chest pain, palpitations and leg swelling.  Gastrointestinal: Negative for abdominal distention, abdominal pain, anal bleeding, blood in stool, constipation, diarrhea, nausea, rectal pain and vomiting.  Endocrine: Negative for cold intolerance, heat intolerance, polydipsia, polyphagia and polyuria.  Genitourinary: Negative for decreased urine volume, difficulty urinating, dyspareunia, dysuria, enuresis, flank pain, frequency, genital sores, hematuria, menstrual problem, pelvic pain, urgency, vaginal bleeding, vaginal discharge and vaginal pain.  Musculoskeletal: Negative for arthralgias, back pain, gait problem, joint swelling, myalgias, neck pain and neck stiffness.  Skin: Negative for color change, pallor, rash and wound.  Allergic/Immunologic: Negative for environmental allergies, food allergies and immunocompromised state.  Neurological: Negative for dizziness, tremors, seizures, syncope, facial asymmetry, speech difficulty, weakness, light-headedness, numbness and headaches.  Hematological: Negative for adenopathy. Does not bruise/bleed easily.  Psychiatric/Behavioral: Negative for agitation, behavioral problems, confusion, decreased concentration, dysphoric mood, hallucinations, self-injury, sleep disturbance and suicidal ideas. The patient is not nervous/anxious and is not hyperactive.    Past Medical History:  Diagnosis Date  .  Bladder cancer ([place]) [place]  . Duplex kidney    RIGHT  . History of adenomatous polyp of colon   . History of recurrent UTIs   . Hyperlipidemia   . Renal cyst, left    NON-COMPLEX  . Wears contact lenses    Past Surgical History:  Procedure Laterality Date  . BENIGN BREAST BX Right 1995  . BUNIONECTOMY Right 2000  . COLONOSCOPY W/ POLYPECTOMY  01/06/2013   single sessile polyp ascending colon.  [facility].  Marland Kitchen CYSTOSCOPY W/ RETROGRADES Bilateral 12/10/2013   Procedure: RETROGRADE PYELOGRAM  ;  Surgeon: Alexis Frock, MD;  Location: [facility];  Service: Urology;  Laterality: Bilateral;  . CYSTOSCOPY W/ RETROGRADES Bilateral 07/15/2014   Procedure: CYSTOSCOPY WITH RETROGRADE PYELOGRAM AND INSTILLATION OF MITOMYCIN C;  Surgeon: Alexis Frock, MD;  Location: [facility];  Service: Urology;  Laterality: Bilateral;  . CYSTOSCOPY W/ RETROGRADES Bilateral 12/31/2015   Procedure: CYSTOSCOPY WITH RETROGRADE PYELOGRAM;  Surgeon: Alexis Frock, MD;  Location: [facility];  Service: Urology;  Laterality: Bilateral;  . CYSTOSCOPY W/ URETERAL STENT PLACEMENT Bilateral 10/22/2013   Procedure: CYSTOSCOPY WITH RETROGRADE PYELOGRAM;  Surgeon: Alexis Frock, MD;  Location: [facility];  Service: Urology;  Laterality: Bilateral;  . CYSTOSCOPY WITH RETROGRADE PYELOGRAM, URETEROSCOPY AND STENT PLACEMENT Bilateral 04/14/2015   Procedure: CYSTOSCOPY WITH RETROGRADE PYELOGRAM, URETEROSCOPY AND RIGHT STENT PLACEMENT;  Surgeon: Alexis Frock, MD;  Location: [facility];  Service: Urology;  Laterality: Bilateral;  . TONSILLECTOMY  85   (age  6)  . TRANSURETHRAL RESECTION OF BLADDER TUMOR N/A 12/31/2015   Procedure: TRANSURETHRAL RESECTION OF BLADDER TUMOR (TURBT) WITH GYRUS;  Surgeon: Alexis Frock, MD;  Location: [facility];  Service: Urology;  Laterality: N/A;  . TRANSURETHRAL RESECTION OF BLADDER  TUMOR WITH GYRUS (TURBT-GYRUS) N/A 10/22/2013   Procedure: TRANSURETHRAL RESECTION OF BLADDER TUMOR WITH ERBE.;  Surgeon: Alexis Frock, MD;  Location[facility];  Service: Urology;  Laterality: N/A;  . TRANSURETHRAL RESECTION OF BLADDER TUMOR WITH GYRUS (TURBT-GYRUS) N/A 12/10/2013   Procedure: RESTAGING TRANSURETHRAL RESECTION OF BLADDER TUMOR WITH GYRUS (TURBT-GYRUS);  Surgeon: Alexis Frock, MD;  Location: [facility];  Service: Urology;  Laterality: N/A;  . TRANSURETHRAL RESECTION OF BLADDER TUMOR WITH GYRUS (TURBT-GYRUS) N/A 07/15/2014   Procedure: TRANSURETHRAL RESECTION OF BLADDER TUMOR WITH GYRUS (TURBT-GYRUS);  Surgeon: Alexis Frock, MD;  Location: [facility];  Service: Urology;  Laterality: N/A;  . TRANSURETHRAL RESECTION OF BLADDER TUMOR WITH GYRUS (TURBT-GYRUS) N/A 04/14/2015   Procedure: TRANSURETHRAL RESECTION OF BLADDER TUMOR WITH GYRUS (TURBT-GYRUS);  Surgeon: Alexis Frock, MD;  Location: [facility];  Service: Urology;  Laterality: N/A;   Allergies  Allergen Reactions  . Hycodan [Hydrocodone-Homatropine] Hives   No current outpatient prescriptions on file.   No current facility-administered medications for this visit.    Social History   Social History  . Marital status: Divorced    Spouse name: N/A  . Number of children: 3  . Years of education: N/A   Occupational History  . 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   Social History Main Topics  . Smoking status: Former Smoker    Packs/day: 0.15    Years: 20.00    Types: Cigarettes    Quit date: 10/17/2012  . Smokeless tobacco: Never Used  . Alcohol use Yes     Comment: OCCASIONAL  . Drug use: No  . Sexual activity: Not Currently    Birth control/ protection: Post-menopausal   Other Topics Concern  . Not on file   Social History Narrative   Marital status:  Divorced; not dating.      Children:  3 children; 2  grandchildren; family within walking distance.        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      Tobacco:  None; quit in 09/2012.      Alcohol: 1 drink per week.       Drugs:  None      Exercise:  One hour of water aerobics 3-4 days per week.      Seatbelt: 100%      Guns:  none      ADLs: independent with all ADLs; drives; no assistant device.      Advanced Directives:  Yes; FULL CODE; no prolonged measures.   Family History  Problem Relation Age of Onset  . Hypertension Mother   . Arthritis Mother     knee replacement; shoulder replacement  . Heart disease Mother     AAA  . Cancer Father     prostate cancer  . Cancer Brother     prostate cancer  . Cancer Brother 50    Melanoma  . Mental illness Sister 78    bipolar disorder    Objective:    BP 124/80   Pulse 83   Temp 98.1 F (36.7 C) (Oral)   Resp  18   Ht 5\' 8"  (1.727 m)   Wt 216 lb (98 kg)   SpO2 95%   BMI 32.84 kg/m  Physical Exam  Constitutional: She is oriented to person, place, and time. She appears well-developed and well-nourished. No distress.  HENT:  Head: Normocephalic and atraumatic.  Right Ear: External ear normal.  Left Ear: External ear normal.  Nose: Nose normal.  Mouth/Throat: Oropharynx is clear and moist.  Eyes: Conjunctivae and EOM are normal. Pupils are equal, round, and reactive to light.  Neck: Normal range of motion and full passive range of motion without pain. Neck supple. No JVD present. Carotid bruit is not present. No thyromegaly present.  Cardiovascular: Normal rate, regular rhythm and normal heart sounds.  Exam reveals no gallop and no friction rub.   No murmur heard. Pulmonary/Chest: Effort normal and breath sounds normal. She has no wheezes. She has no rales. Right breast exhibits no inverted nipple, no mass, no nipple discharge, no skin change and no tenderness. Left breast exhibits no inverted nipple, no mass, no  nipple discharge, no skin change and no tenderness. Breasts are symmetrical.  Abdominal: Soft. Bowel sounds are normal. She exhibits no distension and no mass. There is no tenderness. There is no rebound and no guarding.  Genitourinary: Vagina normal and uterus normal. There is no rash, tenderness or lesion on the right labia. There is no rash, tenderness or lesion on the left labia. Cervix exhibits no motion tenderness. Right adnexum displays no mass, no tenderness and no fullness. Left adnexum displays no mass, no tenderness and no fullness.  Genitourinary Comments: Breast exam nl Ovaries nl Pelvic Exam nl  Musculoskeletal:       Right shoulder: Normal.       Left shoulder: Normal.       Cervical back: Normal.  Lymphadenopathy:    She has no cervical adenopathy.  Neurological: She is alert and oriented to person, place, and time. She has normal reflexes. No cranial nerve deficit. She exhibits normal muscle tone. Coordination normal.  Skin: Skin is warm and dry. No rash noted. She is not diaphoretic. No erythema. No pallor.  Psychiatric: She has a normal mood and affect. Her behavior is normal. Judgment and thought content normal.  Nursing note and vitals reviewed.   Assessment & Plan:   1. Encounter for Medicare annual wellness exam   2. Flu vaccine need   3. Hx of bladder cancer   4. Pure hypercholesterolemia   5. Need for prophylactic vaccination and inoculation against influenza   6. Need for Zostavax administration     Orders Placed This Encounter  Procedures  . Flu Vaccine QUAD 36+ mos IM  . CBC with Differential/Platelet  . Comprehensive metabolic panel    Order Specific Question:   Has the patient fasted?    Answer:   Yes  . Lipid panel    Order Specific Question:   Has the patient fasted?    Answer:   Yes  . POCT urinalysis dipstick  . POCT Microscopic Urinalysis ([facility])   Meds ordered this encounter  Medications  . DISCONTD: Zoster Vaccine Live, PF, (ZOSTAVAX)  19400 UNT/0.65ML injection    Sig: Inject 19,400 Units into the skin once.    Dispense:  0.65 mL    Refill:  0    Return in about 1 year (around 12/27/2016) for complete physical examiniation.  I personally performed the services described in this documentation, which was scribed in my presence. The recorded information has  been reviewed and considered.  Heidee Audi Elayne Guerin, M.D. [facility] [address] [place], [place]  [phone] phone [phone] fax

## 2015-12-28 NOTE — Progress Notes (Signed)
NPO AFTER MN.  ARRIVE AT 0915.   CURRENT LAB RESULTS IN CHART AND EPIC.

## 2015-12-31 ENCOUNTER — Encounter (HOSPITAL_BASED_OUTPATIENT_CLINIC_OR_DEPARTMENT_OTHER): Payer: Self-pay | Admitting: Anesthesiology

## 2015-12-31 ENCOUNTER — Encounter (HOSPITAL_BASED_OUTPATIENT_CLINIC_OR_DEPARTMENT_OTHER)
Admission: RE | Disposition: A | Discharge status: Home / Self Care | Payer: Self-pay | Source: Ambulatory Visit | Attending: Urology

## 2015-12-31 ENCOUNTER — Ambulatory Visit (HOSPITAL_BASED_OUTPATIENT_CLINIC_OR_DEPARTMENT_OTHER)
Admission: RE | Admit: 2015-12-31 | Discharge: 2015-12-31 | Disposition: A | Discharge status: Home / Self Care | Payer: Medicare Other | Source: Ambulatory Visit | Attending: Urology | Admitting: Urology

## 2015-12-31 ENCOUNTER — Ambulatory Visit (HOSPITAL_BASED_OUTPATIENT_CLINIC_OR_DEPARTMENT_OTHER): Payer: Medicare Other | Admitting: Anesthesiology

## 2015-12-31 DIAGNOSIS — C670 Malignant neoplasm of trigone of bladder: Secondary | ICD-10-CM | POA: Insufficient documentation

## 2015-12-31 DIAGNOSIS — N189 Chronic kidney disease, unspecified: Secondary | ICD-10-CM | POA: Diagnosis not present

## 2015-12-31 DIAGNOSIS — Z87891 Personal history of nicotine dependence: Secondary | ICD-10-CM | POA: Diagnosis not present

## 2015-12-31 DIAGNOSIS — D494 Neoplasm of unspecified behavior of bladder: Secondary | ICD-10-CM | POA: Diagnosis not present

## 2015-12-31 DIAGNOSIS — C679 Malignant neoplasm of bladder, unspecified: Secondary | ICD-10-CM | POA: Diagnosis not present

## 2015-12-31 HISTORY — PX: TRANSURETHRAL RESECTION OF BLADDER TUMOR: SHX2575

## 2015-12-31 HISTORY — PX: CYSTOSCOPY W/ RETROGRADES: SHX1426

## 2015-12-31 SURGERY — TURBT (TRANSURETHRAL RESECTION OF BLADDER TUMOR)
Anesthesia: General | Site: Bladder

## 2015-12-31 MED ORDER — DEXAMETHASONE SODIUM PHOSPHATE 10 MG/ML IJ SOLN
INTRAMUSCULAR | Status: AC
  Filled 2015-12-31: qty 1

## 2015-12-31 MED ORDER — OXYCODONE HCL 5 MG PO TABS
5.0000 mg | ORAL_TABLET | Freq: Once | ORAL | Status: DC | PRN
  Filled 2015-12-31: qty 1

## 2015-12-31 MED ORDER — PROPOFOL 10 MG/ML IV BOLUS
INTRAVENOUS | Status: AC
  Filled 2015-12-31: qty 40

## 2015-12-31 MED ORDER — GENTAMICIN IN SALINE 1.6-0.9 MG/ML-% IV SOLN
80.0000 mg | INTRAVENOUS | Status: DC
  Filled 2015-12-31: qty 50

## 2015-12-31 MED ORDER — HYDROMORPHONE HCL 1 MG/ML IJ SOLN
0.2500 mg | INTRAMUSCULAR | Status: DC | PRN
  Filled 2015-12-31: qty 1

## 2015-12-31 MED ORDER — LIDOCAINE 2% (20 MG/ML) 5 ML SYRINGE
INTRAMUSCULAR | Status: AC
  Filled 2015-12-31: qty 5

## 2015-12-31 MED ORDER — LACTATED RINGERS IV SOLN
INTRAVENOUS | Status: DC
  Administered 2015-12-31: 10:00:00 via INTRAVENOUS
  Filled 2015-12-31: qty 1000

## 2015-12-31 MED ORDER — KETOROLAC TROMETHAMINE 30 MG/ML IJ SOLN
INTRAMUSCULAR | Status: AC
  Filled 2015-12-31: qty 1

## 2015-12-31 MED ORDER — PROMETHAZINE HCL 25 MG/ML IJ SOLN
6.2500 mg | INTRAMUSCULAR | Status: DC | PRN
  Filled 2015-12-31: qty 1

## 2015-12-31 MED ORDER — ONDANSETRON HCL 4 MG/2ML IJ SOLN
INTRAMUSCULAR | Status: DC | PRN
  Administered 2015-12-31: 4 mg via INTRAVENOUS

## 2015-12-31 MED ORDER — MIDAZOLAM HCL 2 MG/2ML IJ SOLN
INTRAMUSCULAR | Status: AC
  Filled 2015-12-31: qty 2

## 2015-12-31 MED ORDER — LIDOCAINE 2% (20 MG/ML) 5 ML SYRINGE
INTRAMUSCULAR | Status: DC | PRN
  Administered 2015-12-31: 80 mg via INTRAVENOUS

## 2015-12-31 MED ORDER — ONDANSETRON HCL 4 MG/2ML IJ SOLN
INTRAMUSCULAR | Status: AC
  Filled 2015-12-31: qty 2

## 2015-12-31 MED ORDER — SODIUM CHLORIDE 0.9 % IR SOLN
Status: DC | PRN
  Administered 2015-12-31: 3000 mL via INTRAVESICAL

## 2015-12-31 MED ORDER — MIDAZOLAM HCL 5 MG/5ML IJ SOLN
INTRAMUSCULAR | Status: DC | PRN
  Administered 2015-12-31: 2 mg via INTRAVENOUS

## 2015-12-31 MED ORDER — TRAMADOL HCL 50 MG PO TABS: 50.0000 mg | ORAL_TABLET | Freq: Four times a day (QID) | ORAL | 0 refills | Status: DC | PRN

## 2015-12-31 MED ORDER — PROPOFOL 10 MG/ML IV BOLUS
INTRAVENOUS | Status: DC | PRN
Start: 2015-12-31 — End: 2015-12-31
  Administered 2015-12-31: 150 mg via INTRAVENOUS

## 2015-12-31 MED ORDER — EPHEDRINE SULFATE-NACL 50-0.9 MG/10ML-% IV SOSY
PREFILLED_SYRINGE | INTRAVENOUS | Status: DC | PRN
  Administered 2015-12-31 (×2): 10 mg via INTRAVENOUS

## 2015-12-31 MED ORDER — IOHEXOL 300 MG/ML  SOLN
INTRAMUSCULAR | Status: DC | PRN
  Administered 2015-12-31: 30 mL via URETHRAL

## 2015-12-31 MED ORDER — DEXAMETHASONE SODIUM PHOSPHATE 4 MG/ML IJ SOLN
INTRAMUSCULAR | Status: DC | PRN
  Administered 2015-12-31: 10 mg via INTRAVENOUS

## 2015-12-31 MED ORDER — EPHEDRINE 5 MG/ML INJ
INTRAVENOUS | Status: AC
  Filled 2015-12-31: qty 10

## 2015-12-31 MED ORDER — FENTANYL CITRATE (PF) 100 MCG/2ML IJ SOLN
INTRAMUSCULAR | Status: DC | PRN
  Administered 2015-12-31: 25 ug via INTRAVENOUS
  Administered 2015-12-31: 50 ug via INTRAVENOUS
  Administered 2015-12-31: 25 ug via INTRAVENOUS

## 2015-12-31 MED ORDER — FENTANYL CITRATE (PF) 100 MCG/2ML IJ SOLN
INTRAMUSCULAR | Status: AC
  Filled 2015-12-31: qty 2

## 2015-12-31 MED ORDER — MEPERIDINE HCL 25 MG/ML IJ SOLN
6.2500 mg | INTRAMUSCULAR | Status: DC | PRN
  Filled 2015-12-31: qty 1

## 2015-12-31 MED ORDER — OXYCODONE HCL 5 MG/5ML PO SOLN
5.0000 mg | Freq: Once | ORAL | Status: DC | PRN
  Filled 2015-12-31: qty 5

## 2015-12-31 MED ORDER — GENTAMICIN SULFATE 40 MG/ML IJ SOLN
5.0000 mg/kg | INTRAVENOUS | Status: DC
  Administered 2015-12-31: 390 mg via INTRAVENOUS
  Filled 2015-12-31: qty 9.75

## 2015-12-31 MED ORDER — KETOROLAC TROMETHAMINE 30 MG/ML IJ SOLN
INTRAMUSCULAR | Status: DC | PRN
  Administered 2015-12-31: 30 mg via INTRAVENOUS

## 2015-12-31 MED ORDER — EPHEDRINE SULFATE 50 MG/ML IJ SOLN
INTRAMUSCULAR | Status: DC | PRN
Start: 2015-12-31 — End: 2015-12-31

## 2015-12-31 SURGICAL SUPPLY — 13 items
BAG DRAIN URO-CYSTO SKYTR STRL (DRAIN) ×3 IMPLANT
CATH INTERMIT  6FR 70CM (CATHETERS) ×3 IMPLANT
CLOTH BEACON ORANGE TIMEOUT ST (SAFETY) ×3 IMPLANT
GLOVE BIO SURGEON STRL SZ7.5 (GLOVE) ×3 IMPLANT
GOWN STRL REUS W/ TWL LRG LVL3 (GOWN DISPOSABLE) ×4 IMPLANT
GOWN STRL REUS W/TWL LRG LVL3 (GOWN DISPOSABLE) ×2
GUIDEWIRE ANG ZIPWIRE 038X150 (WIRE) IMPLANT
GUIDEWIRE STR DUAL SENSOR (WIRE) IMPLANT
IV NS IRRIG 3000ML ARTHROMATIC (IV SOLUTION) ×12 IMPLANT
KIT ROOM TURNOVER WOR (KITS) ×3 IMPLANT
MANIFOLD NEPTUNE II (INSTRUMENTS) ×3 IMPLANT
PACK CYSTO (CUSTOM PROCEDURE TRAY) ×3 IMPLANT
SYRINGE 10CC LL (SYRINGE) ×3 IMPLANT

## 2015-12-31 NOTE — Brief Op Note (Signed)
12/31/2015  11:28 AM  PATIENT:  Julian Hy  70 y.o. female  PRE-OPERATIVE DIAGNOSIS:  RECURRENT BLADDER CANCER  POST-OPERATIVE DIAGNOSIS:  RECURRENT BLADDER CANCER  PROCEDURE:  Procedure(s): TRANSURETHRAL RESECTION OF BLADDER TUMOR (TURBT) WITH GYRUS (N/A) CYSTOSCOPY WITH RETROGRADE PYELOGRAM (Bilateral)  SURGEON:  Surgeon(s) and Role:    * Alexis Frock, MD - Primary  PHYSICIAN ASSISTANT:   ASSISTANTS: none   ANESTHESIA:   general  EBL:  Total I/O In: 400 [I.V.:400] Out: -   BLOOD ADMINISTERED:none  DRAINS: none   LOCAL MEDICATIONS USED:  NONE  SPECIMEN:  Source of Specimen:  1 - recurrent bladder tumors 2 - Trigone erythema  DISPOSITION OF SPECIMEN:  PATHOLOGY  COUNTS:  YES  TOURNIQUET:  * No tourniquets in log *  DICTATION: .Other Dictation: Dictation Number [id number]  PLAN OF CARE: Discharge to home after PACU  PATIENT DISPOSITION:  PACU - hemodynamically stable.   Delay start of Pharmacological VTE agent (>24hrs) due to surgical blood loss or risk of bleeding: yes

## 2015-12-31 NOTE — Anesthesia Procedure Notes (Signed)
Procedure Name: LMA Insertion Date/Time: 12/31/2015 10:46 AM Performed by: Nolon Nations Pre-anesthesia Checklist: Patient identified, Emergency Drugs available, Suction available and Patient being monitored Patient Re-evaluated:Patient Re-evaluated prior to inductionOxygen Delivery Method: Circle system utilized Preoxygenation: Pre-oxygenation with 100% oxygen Intubation Type: IV induction Ventilation: Mask ventilation without difficulty LMA: LMA inserted LMA Size: 4.0 Number of attempts: 1 Airway Equipment and Method: Bite block Placement Confirmation: positive ETCO2 Tube secured with: Tape Dental Injury: Teeth and Oropharynx as per pre-operative assessment

## 2015-12-31 NOTE — Anesthesia Preprocedure Evaluation (Signed)
Anesthesia Evaluation  Patient identified by MRN, date of birth, ID band Patient awake    Reviewed: Allergy & Precautions, NPO status , Patient's Chart, lab work & pertinent test results  Airway Mallampati: I  TM Distance: >3 FB Neck ROM: Full    Dental  (+) Teeth Intact, Dental Advisory Given, Caps,    Pulmonary former smoker,    breath sounds clear to auscultation       Cardiovascular Exercise Tolerance: Good  Rhythm:Regular Rate:Normal     Neuro/Psych Anxiety negative neurological ROS     GI/Hepatic Neg liver ROS,   Endo/Other  negative endocrine ROS  Renal/GU Renal disease     Musculoskeletal negative musculoskeletal ROS (+)   Abdominal   Peds  Hematology negative hematology ROS (+)   Anesthesia Other Findings   Reproductive/Obstetrics                             Anesthesia Physical  Anesthesia Plan  ASA: II  Anesthesia Plan: General   Post-op Pain Management:    Induction: Intravenous  Airway Management Planned: LMA  Additional Equipment:   Intra-op Plan:   Post-operative Plan: Extubation in OR  Informed Consent: I have reviewed the patients History and Physical, chart, labs and discussed the procedure including the risks, benefits and alternatives for the proposed anesthesia with the patient or authorized representative who has indicated his/her understanding and acceptance.   Dental advisory given  Plan Discussed with: CRNA, Anesthesiologist and Surgeon  Anesthesia Plan Comments:         Anesthesia Quick Evaluation

## 2015-12-31 NOTE — H&P (Signed)
[name] is an 70 y.o. female.    Chief Complaint: Pre-op Transurethral Resection of Bladder Tumor  HPI:   1 - Recurrent Low Grade Bladder Cancer - Massive volume multifocal bladder cancer by CT and cysto 2015 on eval for persistant dysuria and hematuria. Large trigone and left wall components. No hydro or pelvic adenopathy by CT Urogram.    Summarized Recent Course:    11/2015 cysto small volume recurrence multifocal mostly dome and Rt lateral. (4 tumors x 30m or so each).    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    PMH sig for TNA, foot surgery. NO CV disease. NO strong blood thinners. NO chest or abdominal surgeries. She gets primary care through [facility] on [address]    Today "[name]" is seen to proceed with TURBT. Most recent UA without infectious parameters. NO interval fevers.     Past Medical History:  Diagnosis Date  . Bladder cancer ([address]) [place]  . Duplex kidney    RIGHT  . History of adenomatous polyp of colon   . History of recurrent UTIs   . Hyperlipidemia   . Renal cyst, left    NON-COMPLEX  . Wears contact lenses     Past Surgical History:  Procedure Laterality Date  . BENIGN BREAST BX Right 1995  . BUNIONECTOMY Right 2000  . COLONOSCOPY W/ POLYPECTOMY  01/06/2013   single sessile polyp ascending colon.  [facility].  Marland Kitchen CYSTOSCOPY W/ RETROGRADES Bilateral 12/10/2013   Procedure: RETROGRADE PYELOGRAM  ;  Surgeon: Alexis Frock, MD;  [facility];  Service: Urology;  Laterality: Bilateral;  . CYSTOSCOPY W/ RETROGRADES Bilateral 07/15/2014   Procedure: CYSTOSCOPY WITH RETROGRADE PYELOGRAM AND INSTILLATION OF MITOMYCIN C;  Surgeon: Alexis Frock, MD;  Location: [facility];  Service: Urology;  Laterality: Bilateral;  . CYSTOSCOPY W/ URETERAL  STENT PLACEMENT Bilateral 10/22/2013   Procedure: CYSTOSCOPY WITH RETROGRADE PYELOGRAM;  Surgeon: Alexis Frock, MD;  Location: [facility];  Service: Urology;  Laterality: Bilateral;  . CYSTOSCOPY WITH RETROGRADE PYELOGRAM, URETEROSCOPY AND STENT PLACEMENT Bilateral 04/14/2015   Procedure: CYSTOSCOPY WITH RETROGRADE PYELOGRAM, URETEROSCOPY AND RIGHT STENT PLACEMENT;  Surgeon: Alexis Frock, MD;  Location: [facility];  Service: Urology;  Laterality: Bilateral;  . TONSILLECTOMY  52   (age 34)  . TRANSURETHRAL RESECTION OF BLADDER TUMOR WITH GYRUS (TURBT-GYRUS) N/A 10/22/2013   Procedure: TRANSURETHRAL RESECTION OF BLADDER TUMOR WITH ERBE.;  Surgeon: Alexis Frock, MD;  Location: [facility];  Service: Urology;  Laterality: N/A;  . TRANSURETHRAL RESECTION OF BLADDER TUMOR WITH GYRUS (TURBT-GYRUS) N/A 12/10/2013   Procedure: RESTAGING TRANSURETHRAL RESECTION OF BLADDER TUMOR WITH GYRUS (TURBT-GYRUS);  Surgeon: Alexis Frock, MD;  Location: [facility];  Service: Urology;  Laterality: N/A;  . TRANSURETHRAL RESECTION OF BLADDER TUMOR WITH GYRUS (TURBT-GYRUS) N/A 07/15/2014   Procedure: TRANSURETHRAL RESECTION OF BLADDER TUMOR WITH GYRUS (TURBT-GYRUS);  Surgeon: Alexis Frock, MD;  Location: [facility];  Service: Urology;  Laterality: N/A;  . TRANSURETHRAL RESECTION OF BLADDER TUMOR WITH GYRUS (TURBT-GYRUS) N/A 04/14/2015   Procedure: TRANSURETHRAL RESECTION OF BLADDER TUMOR WITH GYRUS (TURBT-GYRUS);  Surgeon: Alexis Frock, MD;  Location: [facility];  Service: Urology;  Laterality: N/A;    Family History  Problem Relation Age of Onset  .  Hypertension Mother   . Arthritis Mother     knee replacement; shoulder replacement  . Heart disease Mother     AAA  . Cancer Father     prostate cancer  . Cancer Brother     prostate cancer  . Cancer Brother 50    Melanoma  . Mental illness Sister 85    bipolar disorder    Social History:  reports that she quit smoking about 3 years ago. Her smoking use included Cigarettes. She has a 3.00 pack-year smoking history. She has never used smokeless tobacco. She reports that she drinks alcohol. She reports that she does not use drugs.  Allergies:  Allergies  Allergen Reactions  . Hycodan [Hydrocodone-Homatropine] Hives    No prescriptions prior to admission.    No results found for this or any previous visit (from the past 48 hour(s)). No results found.  Review of Systems  Constitutional: Negative.   HENT: Negative.   Eyes: Negative.   Respiratory: Negative.   Cardiovascular: Negative.   Gastrointestinal: Negative.   Genitourinary: Negative.  Negative for flank pain and hematuria.  Musculoskeletal: Negative.   Skin: Negative.   Neurological: Negative.   Endo/Heme/Allergies: Negative.   Psychiatric/Behavioral: Negative.     There were no vitals taken for this visit. Physical Exam  Constitutional: She appears well-developed.  HENT:  Head: Normocephalic.  Eyes: Pupils are equal, round, and reactive to light.  Neck: Normal range of motion.  Cardiovascular: Normal rate.   Respiratory: Effort normal.  GI: Soft.  Genitourinary:  Genitourinary Comments: No CVAT  Musculoskeletal: Normal range of motion.  Neurological: She is alert.  Skin: Skin is warm.  Psychiatric: She has a normal mood and affect.     Assessment/Plan   Discussed TURBT v. observation as she has always been low grade and we both agree on proceed with TURBT as outpatient. Risks, benefits, alternatives, peri-op course, possible need for temporary ureteral stents or foley catheter discussed previously and reiterated today.    Alexis Frock, MD 12/31/2015, 6:03 AM

## 2015-12-31 NOTE — Discharge Instructions (Signed)
1 - You may have urinary urgency (bladder spasms) and bloody urine on / off x few days. This is normal.  2 - Call MD or go to ER for fever >102, severe pain / nausea / vomiting not relieved by medications, or acute change in medical status   Post Anesthesia Home Care Instructions  Activity: Get plenty of rest for the remainder of the day. A responsible adult should stay with you for 24 hours following the procedure.  For the next 24 hours, DO NOT: -Drive a car -Operate machinery -Drink alcoholic beverages -Take any medication unless instructed by your physician -Make any legal decisions or sign important papers.  Meals: Start with liquid foods such as gelatin or soup. Progress to regular foods as tolerated. Avoid greasy, spicy, heavy foods. If nausea and/or vomiting occur, drink only clear liquids until the nausea and/or vomiting subsides. Call your physician if vomiting continues.  Special Instructions/Symptoms: Your throat may feel dry or sore from the anesthesia or the breathing tube placed in your throat during surgery. If this causes discomfort, gargle with warm salt water. The discomfort should disappear within 24 hours.  If you had a scopolamine patch placed behind your ear for the management of post- operative nausea and/or vomiting:  1. The medication in the patch is effective for 72 hours, after which it should be removed.  Wrap patch in a tissue and discard in the trash. Wash hands thoroughly with soap and water. 2. You may remove the patch earlier than 72 hours if you experience unpleasant side effects which may include dry mouth, dizziness or visual disturbances. 3. Avoid touching the patch. Wash your hands with soap and water after contact with the patch.    

## 2015-12-31 NOTE — Transfer of Care (Signed)
  Last Vitals:  Vitals:   12/31/15 0915  BP: (!) 142/88  Pulse: 77  Resp: 16  Temp: 36.9 C    Last Pain:  Vitals:   12/31/15 0915  TempSrc: Oral      Patients Stated Pain Goal: 7 (12/31/15 [id number])  Immediate Anesthesia Transfer of Care Note  Patient: [name]  Procedure(s) Performed: Procedure(s) (LRB): TRANSURETHRAL RESECTION OF BLADDER TUMOR (TURBT) WITH GYRUS (N/A) CYSTOSCOPY WITH RETROGRADE PYELOGRAM (Bilateral)  Patient Location: PACU  Anesthesia Type: General  Level of Consciousness: awake, alert  and oriented  Airway & Oxygen Therapy: Patient Spontanous Breathing and Patient connected to nasal cannula oxygen  Post-op Assessment: Report given to PACU RN and Post -op Vital signs reviewed and stable  Post vital signs: Reviewed and stable  Complications: No apparent anesthesia complications

## 2016-01-03 ENCOUNTER — Encounter (HOSPITAL_BASED_OUTPATIENT_CLINIC_OR_DEPARTMENT_OTHER): Payer: Self-pay | Admitting: Urology

## 2016-01-03 NOTE — Anesthesia Postprocedure Evaluation (Signed)
Anesthesia Post Note  Patient: [name]  Procedure(s) Performed: Procedure(s) (LRB): TRANSURETHRAL RESECTION OF BLADDER TUMOR (TURBT) WITH GYRUS (N/A) CYSTOSCOPY WITH RETROGRADE PYELOGRAM (Bilateral)  Patient location during evaluation: PACU Anesthesia Type: General Level of consciousness: sedated and patient cooperative Pain management: pain level controlled Vital Signs Assessment: post-procedure vital signs reviewed and stable Respiratory status: spontaneous breathing Cardiovascular status: stable Anesthetic complications: no     Last Vitals:  Vitals:   12/31/15 1215 12/31/15 1330  BP: 137/82 129/77  Pulse:  88  Resp:  18  Temp:  36.5 C    Last Pain:  Vitals:   01/03/16 1314  TempSrc:   PainSc: 0-No pain   Pain Goal: Patients Stated Pain Goal: 0 (12/31/15 1200)               Nolon Nations

## 2016-01-03 NOTE — Op Note (Signed)
[name], [name] NO.:  [id number]  MEDICAL RECORD NO.:  [id number]  LOCATION:                                 FACILITY:  PHYSICIAN:  Alexis Frock, MD     DATE OF BIRTH:  Sep 17, 1945  DATE OF PROCEDURE: 12/31/2015                              OPERATIVE REPORT   DIAGNOSIS:  Recurrent bladder tumor, multifocal.  PROCEDURES: 1. Transurethral resection of bladder tumor, volume medium. 2. Bilateral retrograde pyelograms with interpretation.  ESTIMATED BLOOD LOSS:  Nil.  COMPLICATION:  None.  SPECIMENS: 1. Bladder tumor, recurrent for permanent pathology. 2. Trigone erythema for permanent pathology.  DRAINS:  None.  FINDINGS: 1. Complete duplex right kidney without filling defects on retrograde     pyelogram. 2. Unremarkable left retrograde pyelogram. 3. Multifocal recurrent bladder tumor, total volume approximately 2.5     cm2, this comprised approximately seven different foci throughout     the urinary bladder.  Each of which was quite small,     photodocumentation performed. 4. Trigone erythema, left greater than right, concerning for possible     carcinoma in situ, this area was biopsied. 5. Excellent hemostasis, no evidence of bladder perforation following     transurethral resection and fulguration.  INDICATION:  [name] is a very pleasant 70 year old lady with long history of recurrent fortunately low-grade bladder tumor.  She was found on surveillance cystoscopy to have multifocal small volume recurrence early in all quadrants of the bladder, the largest burden being toward the dome, most recent surveillance cystoscopy.  Options were discussed including continued surveillance as she has been reliably low-grade in the past versus proceeding with transurethral resection and she wished to proceed with the latter.  Informed consent was obtained and placed in the medical record.  PROCEDURE IN DETAIL:  The patient being [name],  was verified. Procedure being transurethral resection of bladder tumor, retrograde was confirmed.  Procedure was carried out.  Time-out was performed. Intravenous antibiotics were administered.  General LMA anesthesia was introduced.  The patient was placed into a low lithotomy position and sterile field was created by prepping and draping the patient's vagina, introitus and proximal thighs using iodine x3.  Next, cystourethroscopy was performed using a 21-French rigid cystoscope with offset lens. Inspection of the urinary bladder revealed duplex right distal ureter, Singleton left distal ureter and multifocal bladder tumors in all quadrants, approximately seven different foci, each one of these was less than a cm.  Photodocumentation performed.  Attention was initially directed to retrograde pyelogram.  First, right ureter was cannulated with a 6-French end-hole catheter and right retrograde pyelogram was obtained.  Right retrograde pyelogram was demonstrated one of two duplex ureter and collecting system without filling defects or narrowing.  The other ureters were cannulated and similarly, other aspect of complete duplex ureter, no filling defects or narrowing noted.  Similarly, left retrograde pyelogram was obtained.  Left retrograde pyelogram demonstrated a single left ureter with single- system left kidney.  No filling defects or narrowing noted.  Next, the cold cup biopsy forceps were used to grasp and remove in their entirety, each foci of tumor, each one of these was set aside and set together with each  other, labeled as recurrent bladder tumor, total volume approximately 2.5 cm2.  Next, resectoscope loop was used, medium-sized, and the base of each one of these was fulgurated, resulting in excellent hemostasis, no evidence of perforation.  Additional cold cup biopsies were performed of the area of trigone erythema, set aside, labeled as such and additional coagulation current  was applied to the base of this. Following these maneuvers, there was excellent hemostasis.  No evidence of bladder perforation.  It was not felt that interval catheterization would be warranted.  Bladder was emptied per cystoscope.  Procedure was then terminated.  The patient tolerated the procedure well.  There were no immediate periprocedural complications.  The patient was taken to the postanesthesia care unit in stable condition.    ______________________________ Alexis Frock, MD   ______________________________ Alexis Frock, MD    TM/MEDQ  D:  12/31/2015  T:  01/01/2016  Job:  [id number]

## 2016-01-04 ENCOUNTER — Encounter: Payer: Self-pay | Admitting: Podiatry

## 2016-01-04 ENCOUNTER — Ambulatory Visit (INDEPENDENT_AMBULATORY_CARE_PROVIDER_SITE_OTHER): Payer: Medicare Other

## 2016-01-04 ENCOUNTER — Ambulatory Visit (INDEPENDENT_AMBULATORY_CARE_PROVIDER_SITE_OTHER): Payer: Medicare Other | Admitting: Podiatry

## 2016-01-04 VITALS — BP 135/82 | HR 91 | Resp 14 | Ht 71.0 in | Wt 211.0 lb

## 2016-01-04 DIAGNOSIS — M2012 Hallux valgus (acquired), left foot: Secondary | ICD-10-CM | POA: Diagnosis not present

## 2016-01-04 DIAGNOSIS — R52 Pain, unspecified: Secondary | ICD-10-CM | POA: Diagnosis not present

## 2016-01-04 DIAGNOSIS — M25775 Osteophyte, left foot: Secondary | ICD-10-CM | POA: Diagnosis not present

## 2016-01-04 NOTE — Progress Notes (Signed)
   Subjective:    Patient ID: [name], female    DOB: 1945-04-13, 70 y.o.   MRN: [id number]  HPI this patient presents to the office with chief complaint of pain noted through the big toe bunion on the left foot when she wears certain footgear. She states she also experiences numbness that extends into her big toe left foot. This patient was previously seen by myself at [facility] and surgery was performed for the correction of the bunion, right foot. She states she's having no pain or discomfort from that surgery. She believes that this bunion that she has developed is worsening over time and she has limited footgear to wear. She presents the office to discuss surgical correction for the bunion on the left foot    Review of Systems  All other systems reviewed and are negative.      Objective:   Physical Exam GENERAL APPEARANCE: Alert, conversant. Appropriately groomed. No acute distress.  VASCULAR: Pedal pulses are  palpable at  [facility] and PT bilateral.  Capillary refill time is immediate to all digits,  Normal temperature gradient.   NEUROLOGIC: sensation is normal to 5.07 monofilament at 5/5 sites bilateral.  Light touch is intact bilateral, Muscle strength normal. Numbness extending into great toe left foot secondary to bunion. MUSCULOSKELETAL: acceptable muscle strength, tone and stability bilateral.  Intrinsic muscluature intact bilateral.  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 ROM has diminished compared right big toe.  DERMATOLOGIC: skin color, texture, and turgor are within normal limits.  No preulcerative lesions or ulcers  are seen, no interdigital maceration noted.  No open lesions present.  Digital nails are asymptomatic. No drainage noted.         Assessment & Plan:  HAV 1st MPJ  Left foot.  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   IE  Xray reveal significant dorsomedial exostosis with cystic bone head first metatarsal left foot.  Calcification of both achilles and  plantar fascia.Discussed condition with patient and I will refer her to Dr. Paulla Dolly for surgical consultation.  She desires surgical correction next year.   Gardiner Barefoot DPM

## 2016-01-07 ENCOUNTER — Ambulatory Visit (INDEPENDENT_AMBULATORY_CARE_PROVIDER_SITE_OTHER): Payer: Medicare Other

## 2016-01-07 ENCOUNTER — Encounter: Payer: Self-pay | Admitting: Podiatry

## 2016-01-07 ENCOUNTER — Ambulatory Visit: Payer: Self-pay

## 2016-01-07 ENCOUNTER — Ambulatory Visit (INDEPENDENT_AMBULATORY_CARE_PROVIDER_SITE_OTHER): Payer: Medicare Other | Admitting: Podiatry

## 2016-01-07 VITALS — BP 142/94 | HR 91 | Resp 16

## 2016-01-07 DIAGNOSIS — D169 Benign neoplasm of bone and articular cartilage, unspecified: Secondary | ICD-10-CM

## 2016-01-07 DIAGNOSIS — M79672 Pain in left foot: Principal | ICD-10-CM

## 2016-01-07 DIAGNOSIS — M79671 Pain in right foot: Secondary | ICD-10-CM

## 2016-01-07 DIAGNOSIS — M21619 Bunion of unspecified foot: Secondary | ICD-10-CM | POA: Diagnosis not present

## 2016-01-07 NOTE — Patient Instructions (Signed)

## 2016-01-07 NOTE — Progress Notes (Signed)
Subjective:     Patient ID: [name], female   DOB: 26-Sep-1945, 71 y.o.   MRN: [id number]  HPI patient presents with large bunion deformity left that's been painful and also lesion fifth digit left that at times his discomforting. Patient has previous bunion surgery done right which is done very well and would like to get this left one corrected due to inability to wear shoe gear comfortably   Review of Systems     Objective:   Physical Exam Neurovascular status intact muscle strength adequate range of motion within normal limits with patient found to have hyperostosis medial aspect first metatarsal head left with redness around the joint surface and pain and rotation fifth digit left with keratotic lesion and well healed surgical sites right. Found to have good digital perfusion and well oriented 3    Assessment:     Structural HAV deformity left with deviation the hallux against the second toe along with corrected right and exostosis fifth left    Plan:     H&P x-rays reviewed and discussed treatment options. Due to long-standing nature and pain patient is opted for surgery but needs to wait till January sore we will have her in in mid-December to go over this in greater detail  X-ray report indicates elevation of the intermetatarsal angle between the first and second metatarsals left of about 16 with large spur formation and also rotation of the fifth toe significant nature

## 2016-01-17 DIAGNOSIS — N281 Cyst of kidney, acquired: Secondary | ICD-10-CM | POA: Diagnosis not present

## 2016-01-17 DIAGNOSIS — C678 Malignant neoplasm of overlapping sites of bladder: Secondary | ICD-10-CM | POA: Diagnosis not present

## 2016-01-17 DIAGNOSIS — C675 Malignant neoplasm of bladder neck: Secondary | ICD-10-CM | POA: Diagnosis not present

## 2016-01-20 ENCOUNTER — Telehealth: Payer: Self-pay

## 2016-01-20 NOTE — Telephone Encounter (Signed)
Pt called and left a voicemail on lab machine requesting lab results from 12/28/15. Please review so I can contact pt. Thanks

## 2016-01-22 NOTE — Telephone Encounter (Signed)
Call --- 1. Cholesterol remains elevated; I again recommend starting cholesterol medication. What does patient think about starting cholesterol medication?  2.  Sugar is slightly elevated at 104 which is suggestive of a PREDIABETIC STATE; recommend weight loss, exercise, and low-sugar low-carbohydrate food choices.  Recommend avoiding regular sodas, sweet tea, and fruit juice. Recommend LIMITING: breads, potatoes, desserts, rice, and pastas.  3. One liver function test slightly elevated; recommend avoiding Tylenol containing products and alcohol.  I recommend returning to office in 3-4 months for repeat sugar and repeat liver function testing.  Please have patient make an appointment.

## 2016-01-24 NOTE — Telephone Encounter (Signed)
Patient aware of results via phone.. Per patient will speak with her Pharmacist about her results and will get recommendations from him before she decides to take any medications

## 2016-01-26 ENCOUNTER — Other Ambulatory Visit: Payer: Self-pay | Admitting: Podiatry

## 2016-01-26 DIAGNOSIS — R52 Pain, unspecified: Secondary | ICD-10-CM

## 2016-03-10 ENCOUNTER — Ambulatory Visit (INDEPENDENT_AMBULATORY_CARE_PROVIDER_SITE_OTHER): Payer: Medicare Other | Admitting: Podiatry

## 2016-03-10 ENCOUNTER — Encounter: Payer: Self-pay | Admitting: Podiatry

## 2016-03-10 DIAGNOSIS — M2012 Hallux valgus (acquired), left foot: Secondary | ICD-10-CM

## 2016-03-10 DIAGNOSIS — M898X9 Other specified disorders of bone, unspecified site: Secondary | ICD-10-CM

## 2016-03-10 NOTE — Patient Instructions (Signed)
Pre-Operative Instructions  Congratulations, you have decided to take an important step to improving your quality of life.  You can be assured that the doctors of [facility] will be with you every step of the way.  1. Plan to be at the surgery center/hospital at least 1 (one) hour prior to your scheduled time unless otherwise directed by the surgical center/hospital staff.  You must have a responsible adult accompany you, remain during the surgery and drive you home.  Make sure you have directions to the surgical center/hospital and know how to get there on time. 2. For hospital based surgery you will need to obtain a history and physical form from your family physician within 1 month prior to the date of surgery- we will give you a form for you primary physician.  3. We make every effort to accommodate the date you request for surgery.  There are however, times where surgery dates or times have to be moved.  We will contact you as soon as possible if a change in schedule is required.   4. No Aspirin/Ibuprofen for one week before surgery.  If you are on aspirin, any non-steroidal anti-inflammatory medications (Mobic, Aleve, Ibuprofen) you should stop taking it 7 days prior to your surgery.  You make take Tylenol  For pain prior to surgery.  5. Medications- If you are taking daily heart and blood pressure medications, seizure, reflux, allergy, asthma, anxiety, pain or diabetes medications, make sure the surgery center/hospital is aware before the day of surgery so they may notify you which medications to take or avoid the day of surgery. 6. No food or drink after midnight the night before surgery unless directed otherwise by surgical center/hospital staff. 7. No alcoholic beverages 24 hours prior to surgery.  No smoking 24 hours prior to or 24 hours after surgery. 8. Wear loose pants or shorts- loose enough to fit over bandages, boots, and casts. 9. No slip on shoes, sneakers are best. 10. Bring  your boot with you to the surgery center/hospital.  Also bring crutches or a walker if your physician has prescribed it for you.  If you do not have this equipment, it will be provided for you after surgery. 11. If you have not been contracted by the surgery center/hospital by the day before your surgery, call to confirm the date and time of your surgery. 12. Leave-time from work may vary depending on the type of surgery you have.  Appropriate arrangements should be made prior to surgery with your employer. 13. Prescriptions will be provided immediately following surgery by your doctor.  Have these filled as soon as possible after surgery and take the medication as directed. 14. Remove nail polish on the operative foot. 15. Wash the night before surgery.  The night before surgery wash the foot and leg well with the antibacterial soap provided and water paying special attention to beneath the toenails and in between the toes.  Rinse thoroughly with water and dry well with a towel.  Perform this wash unless told not to do so by your physician.  Enclosed: 1 Ice pack (please put in freezer the night before surgery)   1 Hibiclens skin cleaner   Pre-op Instructions  If you have any questions regarding the instructions, do not hesitate to call our office.  [place]: [address] [place], [place] [address] [phone]  [place]: [address] [place], [place] [address] [phone]  [place]: [address]  [place],  [address] [phone]   Dr.   Norman Regal DPM, Dr. Matthew Wagoner DPM, Dr. M. Todd Hyatt DPM, Dr. Titorya Stover DPM 

## 2016-03-12 NOTE — Progress Notes (Signed)
Subjective:     Patient ID: [name], female   DOB: 20-Dec-1945, 70 y.o.   MRN: [id number]  HPI patient presents stating I'm ready to get this left foot fixed and it's been bothering me quite a bit and also the toe   Review of Systems     Objective:   Physical Exam Neurovascular status intact muscle strength was adequate with patient noted to have structural HAV deformity left with inflammation fluid buildup with a lot of pain and states that the fifth toe is also very tender    Assessment:     Structural HAV deformity left along with keratotic lesion fifth digit left it's painful    Plan:     Discussed and reviewed case with patient and recommended structural bunion correction along with hammertoe repair and discussed surgery risk and recovery. Patient wants surgery and at this point signs consent form after extensive review going over alternative treatments complications with this particular type IV. She understands recovery can take approximate 6 months for full recovery and she was dispensed a air fracture walker today that I want her to get used to prior to the procedure

## 2016-03-27 ENCOUNTER — Other Ambulatory Visit: Payer: Self-pay | Admitting: Family Medicine

## 2016-03-27 ENCOUNTER — Encounter: Payer: Self-pay | Admitting: Family Medicine

## 2016-03-27 DIAGNOSIS — R945 Abnormal results of liver function studies: Principal | ICD-10-CM

## 2016-03-27 DIAGNOSIS — R7989 Other specified abnormal findings of blood chemistry: Secondary | ICD-10-CM

## 2016-03-27 MED ORDER — PRAVASTATIN SODIUM 20 MG PO TABS
20.0000 mg | ORAL_TABLET | Freq: Every day | ORAL | 3 refills | Status: DC
Start: 2016-03-27 — End: 2016-11-20

## 2016-03-27 NOTE — Addendum Note (Signed)
Addended by: Wardell Honour on: 03/27/2016 05:46 PM   Modules accepted: Orders

## 2016-04-10 HISTORY — PX: OTHER SURGICAL HISTORY: SHX169

## 2016-04-18 ENCOUNTER — Encounter: Payer: Self-pay | Admitting: Podiatry

## 2016-04-18 DIAGNOSIS — M25775 Osteophyte, left foot: Secondary | ICD-10-CM | POA: Diagnosis not present

## 2016-04-18 DIAGNOSIS — M2012 Hallux valgus (acquired), left foot: Secondary | ICD-10-CM | POA: Diagnosis not present

## 2016-04-18 DIAGNOSIS — Z01818 Encounter for other preprocedural examination: Secondary | ICD-10-CM | POA: Diagnosis not present

## 2016-04-18 DIAGNOSIS — M2042 Other hammer toe(s) (acquired), left foot: Secondary | ICD-10-CM | POA: Diagnosis not present

## 2016-04-19 ENCOUNTER — Telehealth: Payer: Self-pay

## 2016-04-19 NOTE — Telephone Encounter (Signed)
Spoke with pt regarding post op status. She states that she is doing well, and managing her pain effectively with rest, and pain medication. Denies s/s of infection.Advised on boot usage. She is to call with any questions or concerns

## 2016-04-20 NOTE — Progress Notes (Signed)
DOS 01.09.2018 Altamese Sealy with Internal Kwire Fixation Left Foot; Exostectomy (removal bone spur) 5th Toe Left Foot

## 2016-04-25 ENCOUNTER — Telehealth: Payer: Self-pay | Admitting: Podiatry

## 2016-04-28 ENCOUNTER — Ambulatory Visit (INDEPENDENT_AMBULATORY_CARE_PROVIDER_SITE_OTHER): Payer: Medicare Other

## 2016-04-28 ENCOUNTER — Ambulatory Visit (INDEPENDENT_AMBULATORY_CARE_PROVIDER_SITE_OTHER): Payer: Medicare Other | Admitting: Podiatry

## 2016-04-28 ENCOUNTER — Encounter: Payer: Self-pay | Admitting: Podiatry

## 2016-04-28 VITALS — Temp 97.3°F

## 2016-04-28 DIAGNOSIS — M2012 Hallux valgus (acquired), left foot: Secondary | ICD-10-CM | POA: Diagnosis not present

## 2016-04-28 DIAGNOSIS — M898X9 Other specified disorders of bone, unspecified site: Secondary | ICD-10-CM

## 2016-04-28 NOTE — Progress Notes (Signed)
Subjective:     Patient ID: [name], female   DOB: [birth year], 71 y.o.   MRN: [id number]  HPI patient presents stating she's doing very well with her surgery with minimal discomfort or swelling and she's been wearing her boot and not walking on this without some form of immobilization   Review of Systems     Objective:   Physical Exam Neurovascular status intact negative Homan sign was noted with patient wound edges well coapted with good alignment and range of motion of the first MPJ with no crepitus. Fifth digits and good alignment stitches intact wound edges well coapted    Assessment:     Doing well post forefoot surgery left    Plan:     H&P x-rays reviewed and recommended continued immobilization range of motion and dispensed surgical shoe and Ace wrap. Patient will continue with elevation will be seen back in 2 weeks for suture removal or earlier if needed  X-rays indicate excellent healing of osteotomy with pins in place no signs of movement and good reduction of intermetatarsal angle with rotation of fifth toe noted

## 2016-05-09 ENCOUNTER — Encounter: Payer: Self-pay | Admitting: Podiatry

## 2016-05-12 ENCOUNTER — Ambulatory Visit (INDEPENDENT_AMBULATORY_CARE_PROVIDER_SITE_OTHER): Payer: Medicare Other | Admitting: Podiatry

## 2016-05-12 ENCOUNTER — Ambulatory Visit (INDEPENDENT_AMBULATORY_CARE_PROVIDER_SITE_OTHER): Payer: Medicare Other

## 2016-05-12 DIAGNOSIS — M898X9 Other specified disorders of bone, unspecified site: Secondary | ICD-10-CM

## 2016-05-12 DIAGNOSIS — M2012 Hallux valgus (acquired), left foot: Secondary | ICD-10-CM

## 2016-05-12 DIAGNOSIS — D169 Benign neoplasm of bone and articular cartilage, unspecified: Secondary | ICD-10-CM

## 2016-05-13 NOTE — Progress Notes (Signed)
Subjective:     Patient ID: [name], female   DOB: [birth year], 71 y.o.   MRN: [id number]  HPI patient states she's doing well with minimal discomfort and having good alignment and states she's back in shoes   Review of Systems     Objective:   Physical Exam Neurovascular status intact negative Homans sign was noted with patient's left foot healing well wound edges well coapted good alignment with good range of motion    Assessment:     Doing well post osteotomy first metatarsal left and digital procedure    Plan:     Reviewed x-ray and gradually increase activity levels and will be seen back in 8 weeks or earlier if needed  X-ray report indicates osteotomy is healing well pins in place with good alignment

## 2016-05-18 NOTE — Telephone Encounter (Signed)
error 

## 2016-06-09 ENCOUNTER — Other Ambulatory Visit: Payer: Self-pay | Admitting: Podiatry

## 2016-06-09 ENCOUNTER — Ambulatory Visit (INDEPENDENT_AMBULATORY_CARE_PROVIDER_SITE_OTHER): Payer: Medicare Other

## 2016-06-09 ENCOUNTER — Ambulatory Visit (INDEPENDENT_AMBULATORY_CARE_PROVIDER_SITE_OTHER): Payer: Self-pay | Admitting: Podiatry

## 2016-06-09 DIAGNOSIS — M2012 Hallux valgus (acquired), left foot: Secondary | ICD-10-CM

## 2016-06-09 DIAGNOSIS — M898X9 Other specified disorders of bone, unspecified site: Secondary | ICD-10-CM

## 2016-06-09 DIAGNOSIS — Z09 Encounter for follow-up examination after completed treatment for conditions other than malignant neoplasm: Secondary | ICD-10-CM

## 2016-06-09 MED ORDER — MELOXICAM 15 MG PO TABS: 15.0000 mg | ORAL_TABLET | Freq: Every day | ORAL | 0 refills | Status: DC

## 2016-06-10 NOTE — Progress Notes (Signed)
This patient presents to the office follow-up for an [facility] with first MPJ and the fifth toe surgery. She states that she feels she is doing well and having minimal pain or discomfort at the surgical site says her toes moving well with minimal pain or discomfort. She says she has returned to her regular shoes when she walks. 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  Neurovascular status is same as prior to the surgery. The surgical site has normal healing noted at the incision site. She has excellent dorsiflexion  motion following her surgery. There is no evidence of any swelling but there is an increased temperature noted in the big toe joint, left foot  S/p foot surgery  Rov.  Examination of the x-ray reveals the capital fragment as well as the fixation to be intact. The capital fragment appears to be in good alignment first metatarsal left foot. Patient is doing well except I am concerned that she has increased temperature noted at the osteotomy site of the left foot. Therefore, I recommended that she be prescribed Mobic 15 mg to take one a day for 2 weeks. I also told her that she would benefit from returning to her cam walker in an effort to decrease the temperature in the left forefoot. She should return to the office in 4 weeks for further evaluation and treatment   Gardiner Barefoot DPM

## 2016-07-05 ENCOUNTER — Ambulatory Visit (INDEPENDENT_AMBULATORY_CARE_PROVIDER_SITE_OTHER): Payer: Medicare Other | Admitting: Family Medicine

## 2016-07-05 VITALS — BP 148/92 | HR 76 | Temp 98.3°F | Resp 16 | Ht 69.25 in | Wt 216.6 lb

## 2016-07-05 DIAGNOSIS — R030 Elevated blood-pressure reading, without diagnosis of hypertension: Secondary | ICD-10-CM | POA: Diagnosis not present

## 2016-07-05 NOTE — Patient Instructions (Addendum)
It was good to meet you today.  Pick up a home blood pressure monitor and start taking your blood pressure measurements.  Your blood pressure was a little elevated today.  Come back in 6-8 weeks and we can recheck it. If it is still elevated at that time and because you have had multiple elevated measurements, I would recommend starting a blood pressure medicine.    IF you received an x-ray today, you will receive an invoice from [facility]. Please contact [facility] at [phone] with questions or concerns regarding your invoice.   IF you received labwork today, you will receive an invoice from [facility]. Please contact [facility] at [phone] with questions or concerns regarding your invoice.   Our billing staff will not be able to assist you with questions regarding bills from these companies.  You will be contacted with the lab results as soon as they are available. The fastest way to get your results is to activate your My Chart account. Instructions are located on the last page of this paperwork. If you have not heard from [place] regarding the results in 2 weeks, please contact this office.    We recommend that you schedule a mammogram for breast cancer screening. Typically, you do not need a referral to do this. Please contact a local imaging center to schedule your mammogram.  [facility] - [phone]  *ask for the Radiology Department [facility] ([place]) - [phone] or [phone]  [facility] - [phone] [place] [phone] [facility] - [phone]  *ask for the [facility] - [phone]  *ask for the Radiology Department [facility] - [phone]  *ask for the [place] - [phone]

## 2016-07-05 NOTE — Progress Notes (Signed)
[name] is a 71 y.o. female who presents to Primary Care at [facility] today for elevated Blood pressure:   1.  Elevated blood pressure:  Patient states she was at a community screening on Saturday for blood pressure. Was told that she had high blood pressure needed to have it rechecked at that point. She has gone to using blood pressure machine at the [facility] and has had 2 measurements of 164/104 and 161/103. These were the same day about 2 minutes apart. She states she felt anxious prior to taking her blood pressure measurements a day.  She comes in today to be evaluated for high blood pressure. She has no diagnosis of hypertension. She is not taking any daily medications. She has a history of hyperlipidemia for which she does not take any medicines but is trying to control this with diet and exercise.   No HA, CP, dizziness, shortness of breath, palpitations, or LE swelling.   BP Readings from Last 3 Encounters:  07/05/16 (!) 148/92  01/07/16 (!) 142/94  01/04/16 135/82   She usually does water aerobics and biking for exercise but has been doing less of this recently.  ROS as above.  No fevers or chills.   PMH reviewed. Patient is a nonsmoker.   Past Medical History:  Diagnosis Date  . Bladder cancer ([place]) [place]  . Duplex kidney    RIGHT  . History of adenomatous polyp of colon   . History of recurrent UTIs   . Hyperlipidemia   . Renal cyst, left    NON-COMPLEX  . Wears contact lenses    Past Surgical History:  Procedure Laterality Date  . BENIGN BREAST BX Right 1995  . BUNIONECTOMY Right 2000  . COLONOSCOPY W/ POLYPECTOMY  01/06/2013   single sessile polyp ascending colon.  [facility].  Marland Kitchen CYSTOSCOPY W/ RETROGRADES Bilateral 12/10/2013   Procedure: RETROGRADE PYELOGRAM  ;  Surgeon: Alexis Frock, MD;  Location[facility];  Service: Urology;  Laterality: Bilateral;  . CYSTOSCOPY W/ RETROGRADES Bilateral 07/15/2014   Procedure: CYSTOSCOPY WITH RETROGRADE PYELOGRAM AND INSTILLATION OF MITOMYCIN C;  Surgeon: Alexis Frock, MD;  Location: [facility];  Service: Urology;  Laterality: Bilateral;  . CYSTOSCOPY W/ RETROGRADES Bilateral 12/31/2015   Procedure: CYSTOSCOPY WITH RETROGRADE PYELOGRAM;  Surgeon: Alexis Frock, MD;  Location: [facility];  Service: Urology;  Laterality: Bilateral;  . CYSTOSCOPY W/ URETERAL STENT PLACEMENT Bilateral 10/22/2013   Procedure: CYSTOSCOPY WITH RETROGRADE PYELOGRAM;  Surgeon: Alexis Frock, MD;  Location: [facility];  Service: Urology;  Laterality: Bilateral;  . CYSTOSCOPY WITH RETROGRADE PYELOGRAM, URETEROSCOPY AND STENT PLACEMENT Bilateral 04/14/2015   Procedure: CYSTOSCOPY WITH RETROGRADE PYELOGRAM, URETEROSCOPY AND RIGHT STENT PLACEMENT;  Surgeon: Alexis Frock, MD;  Location: [facility];  Service: Urology;  Laterality: Bilateral;  . TONSILLECTOMY  46   (age 41)  . TRANSURETHRAL RESECTION OF BLADDER TUMOR N/A 12/31/2015   Procedure: TRANSURETHRAL RESECTION OF BLADDER TUMOR (TURBT) WITH GYRUS;  Surgeon: Alexis Frock, MD;  Location: [facility];  Service: Urology;  Laterality: N/A;  . TRANSURETHRAL RESECTION OF BLADDER TUMOR WITH GYRUS (TURBT-GYRUS) N/A 10/22/2013   Procedure: TRANSURETHRAL RESECTION OF BLADDER TUMOR WITH ERBE.;  Surgeon: Alexis Frock, MD;  Location: [facility];  Service: Urology;  Laterality: N/A;  . TRANSURETHRAL RESECTION OF BLADDER TUMOR WITH GYRUS (TURBT-GYRUS) N/A 12/10/2013   Procedure: RESTAGING TRANSURETHRAL RESECTION OF BLADDER TUMOR WITH  GYRUS (TURBT-GYRUS);  Surgeon: Alexis Frock, MD;  Location: [facility];  Service: Urology;  Laterality: N/A;  . TRANSURETHRAL RESECTION OF BLADDER TUMOR WITH GYRUS (TURBT-GYRUS) N/A 07/15/2014   Procedure: TRANSURETHRAL RESECTION OF BLADDER TUMOR WITH GYRUS (TURBT-GYRUS);  Surgeon: Alexis Frock, MD;  Location:  [facility];  Service: Urology;  Laterality: N/A;  . TRANSURETHRAL RESECTION OF BLADDER TUMOR WITH GYRUS (TURBT-GYRUS) N/A 04/14/2015   Procedure: TRANSURETHRAL RESECTION OF BLADDER TUMOR WITH GYRUS (TURBT-GYRUS);  Surgeon: Alexis Frock, MD;  Location: [facility];  Service: Urology;  Laterality: N/A;    Medications reviewed. Current Outpatient Prescriptions  Medication Sig Dispense Refill  . meloxicam (MOBIC) 15 MG tablet Take 1 tablet (15 mg total) by mouth daily. (Patient not taking: Reported on 07/05/2016) 15 tablet 0  . meperidine (DEMEROL) 50 MG tablet Take 50 mg by mouth every 4 (four) hours as needed for severe pain.    Marland Kitchen ondansetron (ZOFRAN) 4 MG tablet Take 4 mg by mouth every 8 (eight) hours as needed for nausea or vomiting.    . pravastatin (PRAVACHOL) 20 MG tablet Take 1 tablet (20 mg total) by mouth daily. (Patient not taking: Reported on 07/05/2016) 90 tablet 3   No current facility-administered medications for this visit.      Physical Exam:  BP (!) 148/92 (BP Location: Right Arm, Patient Position: Sitting, Cuff Size: Large)   Pulse 76   Temp 98.3 F (36.8 C) (Oral)   Resp 16   Ht 5' 9.25" (1.759 m)   Wt 216 lb 9.6 oz (98.2 kg)   SpO2 96%   BMI 31.76 kg/m  Gen:  Alert, cooperative patient who appears stated age in no acute distress.  Vital signs reviewed. HEENT: EOMI,  MMM.   Pulm:  Clear to auscultation bilaterally with good air movement.  No wheezes or rales noted.   Cardiac:  Regular rate and rhythm without murmur auscultated.  Good S1/S2. Abd:  Soft/nondistended/nontender.  Good bowel sounds throughout all four quadrants.  No masses noted.  Exts: No edema BL LE's Neuro: No focal deficits.  Assessment and Plan:  1.  Elevated BP: - She has had several episodes of systolics greater than 628 and diastolic greater than 90. She is 71 years old. -She states she has been caring for her elderly parents for the past several months and has  been fairly inactive. -She does not want to start any medications for this. -I did discuss long-term consequences hypertension with her.  -She would like to try better diet and increased exercise for the next 6-8 weeks and then come back for blood pressure recheck. She is also going to purchase a home blood pressure monitor. She states that she will be ready to start medication at the next visit if it is still elevated or if it is elevated at home the next 6-8 weeks. - FU in 6-8 weeks.

## 2016-08-04 ENCOUNTER — Ambulatory Visit (INDEPENDENT_AMBULATORY_CARE_PROVIDER_SITE_OTHER): Payer: Medicare Other

## 2016-08-04 ENCOUNTER — Ambulatory Visit (INDEPENDENT_AMBULATORY_CARE_PROVIDER_SITE_OTHER): Payer: Medicare Other | Admitting: Podiatry

## 2016-08-04 DIAGNOSIS — M2012 Hallux valgus (acquired), left foot: Secondary | ICD-10-CM

## 2016-08-04 DIAGNOSIS — Z09 Encounter for follow-up examination after completed treatment for conditions other than malignant neoplasm: Secondary | ICD-10-CM

## 2016-08-04 DIAGNOSIS — M898X9 Other specified disorders of bone, unspecified site: Secondary | ICD-10-CM | POA: Diagnosis not present

## 2016-08-07 NOTE — Progress Notes (Signed)
Subjective:    Patient ID: [name], female   DOB: 71 y.o.   MRN: [id number]   HPI patient states I'm doing well with minimal discomfort but I do get swelling still at times    ROS      Objective:  Physical Exam Neurovascular status intact with patient found to have good structural correction of the left bunion left fifth digit with wound edges well coapted mild swelling no pain upon movement and good range of motion    Assessment:   Doing well post osteotomy first metatarsal left and exostectomy fifth left      Plan:     Reviewed x-rays explaining that there is been some movement around the first metatarsal left but it should heal uneventfully as time goes on  X-rays indicate there is been some movement around the first metatarsal left but it is consolidating and fixation is intact and it should heal uneventfully but we'll be watched over the next several months

## 2016-09-18 ENCOUNTER — Ambulatory Visit (INDEPENDENT_AMBULATORY_CARE_PROVIDER_SITE_OTHER): Payer: Medicare Other

## 2016-09-18 ENCOUNTER — Ambulatory Visit (INDEPENDENT_AMBULATORY_CARE_PROVIDER_SITE_OTHER): Payer: Medicare Other | Admitting: Podiatry

## 2016-09-18 DIAGNOSIS — M898X9 Other specified disorders of bone, unspecified site: Secondary | ICD-10-CM

## 2016-09-18 DIAGNOSIS — M2012 Hallux valgus (acquired), left foot: Secondary | ICD-10-CM | POA: Diagnosis not present

## 2016-09-20 NOTE — Progress Notes (Signed)
Subjective: [name] is a 71 y.o. is seen today in office s/p Altamese [place] and exostectomy of the 5th digit preformed on 04-18-2016 with Dr. Paulla Dolly. They state their pain is minimal. She does still notes swelling to the left foot. Only concern states the fifth toe as of last couple day she started he was a mild amount of redness and contracture to the toe. She's had no recent treatment for this. She denies any recent injury or trauma. Denies any systemic complaints such as fevers, chills, nausea, vomiting. No calf pain, chest pain, shortness of breath.   Objective: General: No acute distress, AAOx3  DP/PT pulses palpable 2/4, CRT < 3 sec to all digits.  Protective sensation intact. Motor function intact.  Left foot: Incision is well coapted and a scar is well healed. There is mild edema to the left foot. There is no pain on the first metatarsal or along the first MPJ. There is some mild restriction first MPJ range of motion. On the fifth digit there does appear to be flexion contracture present the DIPJ of the prominence of the dorsal DIPj with mild irritation, erythema. There is no skin breakdown. There is no warmth, flexions, crepitus or any other signs of infection. No other areas of tenderness identified this time. She states that she is doing well and she knows that this can take time to heal No other open lesions or pre-ulcerative lesions.  No pain with calf compression, swelling, warmth, erythema.   Assessment and Plan:  Status post left foot surgery, doing well with no complications   -Treatment options discussed including all alternatives, risks, and complications -X-rays were obtained and reviewed with the patient. 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 -First metatarsals a symptomatic. I discussed this case with Dr. Paulla Dolly as well about possibly bone estimated. He will see her back in 4 weeks and repeat x-rays and possibly bone  stridor at that point.  -Offloading dispensed to the fifth digit. -Ice/elevation -Pain medication as needed. -Monitor for any clinical signs or symptoms of infection and DVT/PE and directed to call the office immediately should any occur or go to the ER. -Follow-up in 4 weeks or sooner if any problems arise. In the meantime, encouraged to call the office with any questions, concerns, change in symptoms.   Celesta Gentile, DPM

## 2016-09-22 ENCOUNTER — Encounter: Payer: Self-pay | Admitting: Family Medicine

## 2016-09-22 DIAGNOSIS — Z803 Family history of malignant neoplasm of breast: Secondary | ICD-10-CM | POA: Diagnosis not present

## 2016-09-22 DIAGNOSIS — Z1231 Encounter for screening mammogram for malignant neoplasm of breast: Secondary | ICD-10-CM | POA: Diagnosis not present

## 2016-09-25 ENCOUNTER — Ambulatory Visit: Payer: Medicare Other

## 2016-10-16 ENCOUNTER — Ambulatory Visit: Payer: Medicare Other

## 2016-10-16 ENCOUNTER — Ambulatory Visit (INDEPENDENT_AMBULATORY_CARE_PROVIDER_SITE_OTHER): Payer: Medicare Other

## 2016-10-16 ENCOUNTER — Encounter: Payer: Self-pay | Admitting: Podiatry

## 2016-10-16 ENCOUNTER — Ambulatory Visit (INDEPENDENT_AMBULATORY_CARE_PROVIDER_SITE_OTHER): Payer: Self-pay | Admitting: Podiatry

## 2016-10-16 DIAGNOSIS — M2012 Hallux valgus (acquired), left foot: Secondary | ICD-10-CM

## 2016-10-16 DIAGNOSIS — M898X9 Other specified disorders of bone, unspecified site: Secondary | ICD-10-CM

## 2016-10-17 DIAGNOSIS — N6002 Solitary cyst of left breast: Secondary | ICD-10-CM | POA: Diagnosis not present

## 2016-10-17 DIAGNOSIS — N6489 Other specified disorders of breast: Secondary | ICD-10-CM | POA: Diagnosis not present

## 2016-10-17 DIAGNOSIS — R922 Inconclusive mammogram: Secondary | ICD-10-CM | POA: Diagnosis not present

## 2016-10-20 NOTE — Progress Notes (Signed)
Subjective: [name] is a 71 y.o. is seen today in office s/p Altamese [place] and exostectomy of the 5th digit preformed on 04-18-2016 with Dr. Paulla Dolly. She states that overall she is doing better. Swelling to her foot is actually improved. He has no pain in the bunion site. She states the swelling the redness the fifth toes also improved however does become painful intermittently but this is also improved. She feels that she has made improvements over the last 1 month.  She denies any recent injury or trauma. Denies any systemic complaints such as fevers, chills, nausea, vomiting. No calf pain, chest pain, shortness of breath.   Objective: General: No acute distress, AAOx3  DP/PT pulses palpable 2/4, CRT < 3 sec to all digits.  Protective sensation intact. Motor function intact.  Left foot: Incision is well coapted and a scar is well healed. There is decreased edema to the surgical foot. There is no erythema or increase in warmth. No pain on the bunion site. Overall she states that she is happy with the outcome of the bunion. The fifth toe is a mild flexion contracture with some mild localized erythema and edema to the toe have the severely much improved compared to what it was. There is no other area of tenderness identified this time. No other open lesions or pre-ulcerative lesions.  No pain with calf compression, swelling, warmth, erythema.   Assessment and Plan:  Status post left foot surgery, doing well with no complications   -Treatment options discussed including all alternatives, risks, and complications -X-rays were obtained and reviewed with the patient. There is evidence of healing the first metatarsal. Unsurprising certain fifth digit. No evidence of acute fracture identified today. -At this point her symptoms have improved. I recommend continued compression to help with swelling as well as supportive shoe gear. The fifth toe is actually improved. I will hold off on the bone  sooner as there is evidence of healing. -Follow-up in 4-6 weeks with Dr. Paulla Dolly or sooner if needed.  Celesta Gentile, DPM

## 2016-11-03 DIAGNOSIS — N3020 Other chronic cystitis without hematuria: Secondary | ICD-10-CM | POA: Diagnosis not present

## 2016-11-03 DIAGNOSIS — C678 Malignant neoplasm of overlapping sites of bladder: Secondary | ICD-10-CM | POA: Diagnosis not present

## 2016-11-06 ENCOUNTER — Other Ambulatory Visit: Payer: Self-pay | Admitting: Urology

## 2016-11-07 ENCOUNTER — Encounter (HOSPITAL_BASED_OUTPATIENT_CLINIC_OR_DEPARTMENT_OTHER): Payer: Self-pay | Admitting: *Deleted

## 2016-11-07 NOTE — Progress Notes (Signed)
To [facility] at 1330-Istat 8 on arrival-Npo solids after Mn-clear liquids( no dairy,pulp) until 0900,then Npo.

## 2016-11-17 ENCOUNTER — Ambulatory Visit (HOSPITAL_BASED_OUTPATIENT_CLINIC_OR_DEPARTMENT_OTHER): Payer: Medicare Other | Admitting: Anesthesiology

## 2016-11-17 ENCOUNTER — Ambulatory Visit (HOSPITAL_BASED_OUTPATIENT_CLINIC_OR_DEPARTMENT_OTHER)
Admission: RE | Admit: 2016-11-17 | Discharge: 2016-11-17 | Disposition: A | Discharge status: Home / Self Care | Payer: Medicare Other | Source: Ambulatory Visit | Attending: Urology | Admitting: Urology

## 2016-11-17 ENCOUNTER — Encounter (HOSPITAL_BASED_OUTPATIENT_CLINIC_OR_DEPARTMENT_OTHER)
Admission: RE | Disposition: A | Discharge status: Home / Self Care | Payer: Self-pay | Source: Ambulatory Visit | Attending: Urology

## 2016-11-17 ENCOUNTER — Encounter (HOSPITAL_BASED_OUTPATIENT_CLINIC_OR_DEPARTMENT_OTHER): Payer: Self-pay | Admitting: *Deleted

## 2016-11-17 DIAGNOSIS — Z87891 Personal history of nicotine dependence: Secondary | ICD-10-CM | POA: Insufficient documentation

## 2016-11-17 DIAGNOSIS — Z87440 Personal history of urinary (tract) infections: Secondary | ICD-10-CM | POA: Diagnosis not present

## 2016-11-17 DIAGNOSIS — C679 Malignant neoplasm of bladder, unspecified: Secondary | ICD-10-CM | POA: Diagnosis not present

## 2016-11-17 DIAGNOSIS — C672 Malignant neoplasm of lateral wall of bladder: Secondary | ICD-10-CM | POA: Insufficient documentation

## 2016-11-17 DIAGNOSIS — Q630 Accessory kidney: Secondary | ICD-10-CM | POA: Diagnosis not present

## 2016-11-17 DIAGNOSIS — C678 Malignant neoplasm of overlapping sites of bladder: Secondary | ICD-10-CM | POA: Diagnosis not present

## 2016-11-17 DIAGNOSIS — E7800 Pure hypercholesterolemia, unspecified: Secondary | ICD-10-CM | POA: Diagnosis not present

## 2016-11-17 HISTORY — PX: CYSTOSCOPY W/ RETROGRADES: SHX1426

## 2016-11-17 HISTORY — PX: TRANSURETHRAL RESECTION OF BLADDER TUMOR: SHX2575

## 2016-11-17 LAB — POCT I-STAT, CHEM 8
BUN: 12 mg/dL (ref 6–20)
CREATININE: 0.8 mg/dL (ref 0.44–1.00)
Calcium, Ion: 1.24 mmol/L (ref 1.15–1.40)
Chloride: 106 mmol/L (ref 101–111)
GLUCOSE: 91 mg/dL (ref 65–99)
HEMATOCRIT: 40 % (ref 36.0–46.0)
HEMOGLOBIN: 13.6 g/dL (ref 12.0–15.0)
Potassium: 4.2 mmol/L (ref 3.5–5.1)
Sodium: 140 mmol/L (ref 135–145)
TCO2: 23 mmol/L (ref 0–100)

## 2016-11-17 SURGERY — TURBT (TRANSURETHRAL RESECTION OF BLADDER TUMOR)
Anesthesia: General | Site: Bladder

## 2016-11-17 MED ORDER — ONDANSETRON HCL 4 MG/2ML IJ SOLN
INTRAMUSCULAR | Status: AC
  Filled 2016-11-17: qty 2

## 2016-11-17 MED ORDER — KETOROLAC TROMETHAMINE 30 MG/ML IJ SOLN
INTRAMUSCULAR | Status: DC | PRN
  Administered 2016-11-17: 30 mg via INTRAVENOUS

## 2016-11-17 MED ORDER — MIDAZOLAM HCL 5 MG/5ML IJ SOLN
INTRAMUSCULAR | Status: DC | PRN
  Administered 2016-11-17: 2 mg via INTRAVENOUS

## 2016-11-17 MED ORDER — LIDOCAINE 2% (20 MG/ML) 5 ML SYRINGE
INTRAMUSCULAR | Status: AC
  Filled 2016-11-17: qty 5

## 2016-11-17 MED ORDER — DEXAMETHASONE SODIUM PHOSPHATE 10 MG/ML IJ SOLN
INTRAMUSCULAR | Status: AC
  Filled 2016-11-17: qty 1

## 2016-11-17 MED ORDER — DEXAMETHASONE SODIUM PHOSPHATE 4 MG/ML IJ SOLN
INTRAMUSCULAR | Status: DC | PRN
  Administered 2016-11-17: 10 mg via INTRAVENOUS

## 2016-11-17 MED ORDER — MIDAZOLAM HCL 2 MG/2ML IJ SOLN
INTRAMUSCULAR | Status: AC
  Filled 2016-11-17: qty 2

## 2016-11-17 MED ORDER — GENTAMICIN SULFATE 40 MG/ML IJ SOLN
5.0000 mg/kg | INTRAVENOUS | Status: DC
  Filled 2016-11-17: qty 12.25

## 2016-11-17 MED ORDER — FENTANYL CITRATE (PF) 100 MCG/2ML IJ SOLN
INTRAMUSCULAR | Status: DC | PRN
  Administered 2016-11-17 (×2): 50 ug via INTRAVENOUS

## 2016-11-17 MED ORDER — TRAMADOL HCL 50 MG PO TABS: 50.0000 mg | ORAL_TABLET | Freq: Four times a day (QID) | ORAL | 0 refills | Status: DC | PRN

## 2016-11-17 MED ORDER — FENTANYL CITRATE (PF) 100 MCG/2ML IJ SOLN
INTRAMUSCULAR | Status: AC
  Filled 2016-11-17: qty 2

## 2016-11-17 MED ORDER — PROPOFOL 10 MG/ML IV BOLUS
INTRAVENOUS | Status: DC | PRN
Start: 2016-11-17 — End: 2016-11-17
  Administered 2016-11-17: 170 mg via INTRAVENOUS

## 2016-11-17 MED ORDER — SODIUM CHLORIDE 0.9 % IR SOLN
Status: DC | PRN
  Administered 2016-11-17: 6000 mL via INTRAVESICAL

## 2016-11-17 MED ORDER — PROPOFOL 10 MG/ML IV BOLUS
INTRAVENOUS | Status: AC
  Filled 2016-11-17: qty 20

## 2016-11-17 MED ORDER — LACTATED RINGERS IV SOLN
INTRAVENOUS | Status: DC
  Administered 2016-11-17 (×2): via INTRAVENOUS
  Filled 2016-11-17: qty 1000

## 2016-11-17 MED ORDER — ONDANSETRON HCL 4 MG/2ML IJ SOLN
INTRAMUSCULAR | Status: DC | PRN
  Administered 2016-11-17: 4 mg via INTRAVENOUS

## 2016-11-17 MED ORDER — EPHEDRINE 5 MG/ML INJ
INTRAVENOUS | Status: AC
  Filled 2016-11-17: qty 10

## 2016-11-17 MED ORDER — SENNOSIDES-DOCUSATE SODIUM 8.6-50 MG PO TABS: 1.0000 | ORAL_TABLET | Freq: Two times a day (BID) | ORAL | 0 refills | Status: DC

## 2016-11-17 MED ORDER — LIDOCAINE 2% (20 MG/ML) 5 ML SYRINGE
INTRAMUSCULAR | Status: DC | PRN
  Administered 2016-11-17: 80 mg via INTRAVENOUS

## 2016-11-17 MED ORDER — IOHEXOL 300 MG/ML  SOLN
INTRAMUSCULAR | Status: DC | PRN
  Administered 2016-11-17: 20 mL via INTRAVENOUS

## 2016-11-17 MED ORDER — EPHEDRINE SULFATE-NACL 50-0.9 MG/10ML-% IV SOSY
PREFILLED_SYRINGE | INTRAVENOUS | Status: DC | PRN
  Administered 2016-11-17: 15 mg via INTRAVENOUS

## 2016-11-17 MED ORDER — GENTAMICIN SULFATE 40 MG/ML IJ SOLN
5.0000 mg/kg | INTRAVENOUS | Status: AC
  Administered 2016-11-17: 400 mg via INTRAVENOUS
  Filled 2016-11-17: qty 10

## 2016-11-17 SURGICAL SUPPLY — 21 items
BAG DRAIN URO-CYSTO SKYTR STRL (DRAIN) ×3 IMPLANT
BAG URINE DRAINAGE (UROLOGICAL SUPPLIES) IMPLANT
BAG URINE LEG 19OZ MD ST LTX (BAG) IMPLANT
BAG URINE LEG 500ML (DRAIN) IMPLANT
CATH FOLEY 2WAY SLVR  5CC 22FR (CATHETERS)
CATH FOLEY 2WAY SLVR 30CC 20FR (CATHETERS) IMPLANT
CATH FOLEY 2WAY SLVR 5CC 22FR (CATHETERS) IMPLANT
CATH INTERMIT  6FR 70CM (CATHETERS) ×3 IMPLANT
CLOTH BEACON ORANGE TIMEOUT ST (SAFETY) ×3 IMPLANT
ELECT REM PT RETURN 9FT ADLT (ELECTROSURGICAL) ×3
ELECTRODE REM PT RTRN 9FT ADLT (ELECTROSURGICAL) ×2 IMPLANT
EVACUATOR MICROVAS BLADDER (UROLOGICAL SUPPLIES) IMPLANT
GLOVE BIO SURGEON STRL SZ7.5 (GLOVE) ×3 IMPLANT
GOWN STRL REUS W/ TWL LRG LVL3 (GOWN DISPOSABLE) ×2 IMPLANT
GOWN STRL REUS W/TWL LRG LVL3 (GOWN DISPOSABLE) ×1
IV NS IRRIG 3000ML ARTHROMATIC (IV SOLUTION) ×6 IMPLANT
KIT RM TURNOVER CYSTO AR (KITS) ×3 IMPLANT
MANIFOLD NEPTUNE II (INSTRUMENTS) ×3 IMPLANT
PACK CYSTO (CUSTOM PROCEDURE TRAY) ×3 IMPLANT
SYRINGE IRR TOOMEY STRL 70CC (SYRINGE) IMPLANT
TUBE CONNECTING 12X1/4 (SUCTIONS) IMPLANT

## 2016-11-17 NOTE — Discharge Instructions (Signed)
CYSTOSCOPY HOME CARE INSTRUCTIONS  Activity: Rest for the remainder of the day.  Do not drive or operate equipment today.  You may resume normal activities in one to two days as instructed by your physician.   Meals: Drink plenty of liquids and eat light foods such as gelatin or soup this evening.  You may return to a normal meal plan tomorrow.  Return to Work: You may return to work in one to two days or as instructed by your physician.  Special Instructions / Symptoms: Call your physician if any of these symptoms occur:   -persistent or heavy bleeding  -bleeding which continues after first few urination  -large blood clots that are difficult to pass  -urine stream diminishes or stops completely  -fever equal to or higher than 101 degrees Farenheit.  -cloudy urine with a strong, foul odor  -severe pain  Females should always wipe from front to back after elimination.  You may feel some burning pain when you urinate.  This should disappear with time.  Applying moist heat to the lower abdomen or a hot tub bath may help relieve the pain. \  Post Anesthesia Home Care Instructions  Activity: Get plenty of rest for the remainder of the day. A responsible individual must stay with you for 24 hours following the procedure.  For the next 24 hours, DO NOT: -Drive a car -Paediatric nurse -Drink alcoholic beverages -Take any medication unless instructed by your physician -Make any legal decisions or sign important papers.  Meals: Start with liquid foods such as gelatin or soup. Progress to regular foods as tolerated. Avoid greasy, spicy, heavy foods. If nausea and/or vomiting occur, drink only clear liquids until the nausea and/or vomiting subsides. Call your physician if vomiting continues.  Special Instructions/Symptoms: Your throat may feel dry or sore from the anesthesia or the breathing tube placed in your throat during surgery. If this causes discomfort, gargle with warm salt  water. The discomfort should disappear within 24 hours.  If you had a scopolamine patch placed behind your ear for the management of post- operative nausea and/or vomiting:  1. The medication in the patch is effective for 72 hours, after which it should be removed.  Wrap patch in a tissue and discard in the trash. Wash hands thoroughly with soap and water. 2. You may remove the patch earlier than 72 hours if you experience unpleasant side effects which may include dry mouth, dizziness or visual disturbances. 3. Avoid touching the patch. Wash your hands with soap and water after contact with the patch.   1 - You may have urinary urgency (bladder spasms) and bloody urine x few days. This is normal.  2 - Call MD or go to ER for fever >102, severe pain / nausea / vomiting not relieved by medications, or acute change in medical status

## 2016-11-17 NOTE — Brief Op Note (Signed)
11/17/2016  4:15 PM  PATIENT:  [name]  71 y.o. female  PRE-OPERATIVE DIAGNOSIS:  RECURRENT BLADDER CANCER  POST-OPERATIVE DIAGNOSIS:  RECURRENT BLADDER CANCER  PROCEDURE:  Procedure(s): TRANSURETHRAL RESECTION OF BLADDER TUMOR (TURBT) (N/A) CYSTOSCOPY WITH RETROGRADE PYELOGRAM (Bilateral)  SURGEON:  Surgeon(s) and Role:    * Alexis Frock, MD - Primary  PHYSICIAN ASSISTANT:   ASSISTANTS: none   ANESTHESIA:   general  EBL:  Total I/O In: -  Out: 20 [Blood:20]  BLOOD ADMINISTERED:none  DRAINS: none   LOCAL MEDICATIONS USED:  NONE  SPECIMEN:  Source of Specimen:  1 - bladder tumor; 2 - base of bladder tumor  DISPOSITION OF SPECIMEN:  PATHOLOGY  COUNTS:  YES  TOURNIQUET:  * No tourniquets in log *  DICTATION: .Other Dictation: Dictation Number [id number]  PLAN OF CARE: Discharge to home after PACU  PATIENT DISPOSITION:  PACU - hemodynamically stable.   Delay start of Pharmacological VTE agent (>24hrs) due to surgical blood loss or risk of bleeding: yes

## 2016-11-17 NOTE — Interval H&P Note (Signed)
History and Physical Interval Note:  11/17/2016 2:26 PM  [name]  has presented today for surgery, with the diagnosis of RECURRENT BLADDER CANCER  The various methods of treatment have been discussed with the patient and family. After consideration of risks, benefits and other options for treatment, the patient has consented to  Procedure(s): TRANSURETHRAL RESECTION OF BLADDER TUMOR (TURBT) (N/A) CYSTOSCOPY WITH RETROGRADE PYELOGRAM (Bilateral) as a surgical intervention .  The patient's history has been reviewed, patient examined, no change in status, stable for surgery.  I have reviewed the patient's chart and labs.  Questions were answered to the patient's satisfaction.     Versie Soave

## 2016-11-17 NOTE — H&P (Signed)
[name] is an 71 y.o. female.    Chief Complaint: Pre-OP Transurethral Resection of BLadder TUmor / Retrograde Pyelograms  HPI:   1 - Recurrent Low Grade Bladder Cancer - Massive volume multifocal bladder cancer by CT and cysto 2015 on eval for persistant dysuria and hematuria. Large trigone and left wall components. No hydro or pelvic adenopathy by CT Urogram.    Summarized Recent Course:   10/2013 - TaG1 with muscle in specimen by massive volume TURBT; 01/2014 reTURBT TaG1   06/2014 multifocal small recurrence --> 07/2014 TURBT + Mito C T1G1 with muscle in specimen --> induction BCG x6   03/2015 multifocal recurrence of many small papillary tumors including Rt UO area --> PUNLUMP by TURBT / Rt diagnostic ureteroscopy / stent.   07/2015 - cysto NED; 11/2015 cysto multifocal small volume recurrence ---> TaG1 TURBT 12/2015   10/2016 cysto multifocal small volume recurrence     PMH sig for TNA, foot surgery. NO CV disease. NO strong blood thinners. NO chest or abdominal surgeries. She gets primary care through [facility] on [place]    Today "[name]" is seen to proceed with TURBT / retrogrades. NO interval fevers. Most recent UA without infectious parameters.    Past Medical History:  Diagnosis Date  . Bladder cancer ([place]) [place]  . Duplex kidney    RIGHT  . History of adenomatous polyp of colon   . History of recurrent UTIs   . Hyperlipidemia   . Renal cyst, left    NON-COMPLEX  . Wears contact lenses     Past Surgical History:  Procedure Laterality Date  . BENIGN BREAST BX Right 1995  . BUNIONECTOMY Right 2000  . COLONOSCOPY W/ POLYPECTOMY  01/06/2013   single sessile polyp ascending colon.  [facility].  Marland Kitchen CYSTOSCOPY W/ RETROGRADES Bilateral 12/10/2013   Procedure: RETROGRADE PYELOGRAM  ;  Surgeon: Alexis Frock, MD;  Location: [facility];  Service: Urology;  Laterality: Bilateral;  . CYSTOSCOPY W/ RETROGRADES  Bilateral 07/15/2014   Procedure: CYSTOSCOPY WITH RETROGRADE PYELOGRAM AND INSTILLATION OF MITOMYCIN C;  Surgeon: Alexis Frock, MD;  Location: [facility];  Service: Urology;  Laterality: Bilateral;  . CYSTOSCOPY W/ RETROGRADES Bilateral 12/31/2015   Procedure: CYSTOSCOPY WITH RETROGRADE PYELOGRAM;  Surgeon: Alexis Frock, MD;  Location: [facility];  Service: Urology;  Laterality: Bilateral;  . CYSTOSCOPY W/ URETERAL STENT PLACEMENT Bilateral 10/22/2013   Procedure: CYSTOSCOPY WITH RETROGRADE PYELOGRAM;  Surgeon: Alexis Frock, MD;  Location: [facility];  Service: Urology;  Laterality: Bilateral;  . CYSTOSCOPY WITH RETROGRADE PYELOGRAM, URETEROSCOPY AND STENT PLACEMENT Bilateral 04/14/2015   Procedure: CYSTOSCOPY WITH RETROGRADE PYELOGRAM, URETEROSCOPY AND RIGHT STENT PLACEMENT;  Surgeon: Alexis Frock, MD;  Location: [facility];  Service: Urology;  Laterality: Bilateral;  . TONSILLECTOMY  95   (age 78)  . TRANSURETHRAL RESECTION OF BLADDER TUMOR N/A 12/31/2015   Procedure: TRANSURETHRAL RESECTION OF BLADDER TUMOR (TURBT) WITH GYRUS;  Surgeon: Alexis Frock, MD;  Location: [facility];  Service: Urology;  Laterality: N/A;  . TRANSURETHRAL RESECTION OF BLADDER TUMOR WITH GYRUS (TURBT-GYRUS) N/A 10/22/2013   Procedure: TRANSURETHRAL RESECTION OF BLADDER TUMOR WITH ERBE.;  Surgeon: Alexis Frock, MD;  Location: [facility];  Service: Urology;  Laterality: N/A;  . TRANSURETHRAL RESECTION OF BLADDER TUMOR WITH GYRUS (TURBT-GYRUS) N/A 12/10/2013   Procedure: RESTAGING TRANSURETHRAL RESECTION OF BLADDER TUMOR WITH GYRUS (TURBT-GYRUS);  Surgeon: Hubbard Robinson  Tresa Moore, MD;  Location: [facility];  Service: Urology;  Laterality: N/A;  . TRANSURETHRAL RESECTION OF BLADDER TUMOR WITH GYRUS (TURBT-GYRUS) N/A 07/15/2014   Procedure: TRANSURETHRAL RESECTION OF BLADDER TUMOR WITH GYRUS (TURBT-GYRUS);  Surgeon: Alexis Frock, MD;  Location: [facility];  Service: Urology;  Laterality: N/A;  . TRANSURETHRAL RESECTION OF BLADDER TUMOR WITH GYRUS (TURBT-GYRUS) N/A 04/14/2015   Procedure: TRANSURETHRAL RESECTION OF BLADDER TUMOR WITH GYRUS (TURBT-GYRUS);  Surgeon: Alexis Frock, MD;  Location: [facility];  Service: Urology;  Laterality: N/A;    Family History  Problem Relation Age of Onset  . Hypertension Mother   . Arthritis Mother        knee replacement; shoulder replacement  . Heart disease Mother        AAA  . Cancer Father        prostate cancer  . Cancer Brother        prostate cancer  . Cancer Brother 50       Melanoma  . Mental illness Sister 55       bipolar disorder   Social History:  reports that she quit smoking about 4 years ago. Her smoking use included Cigarettes. She has a 3.00 pack-year smoking history. She has never used smokeless tobacco. She reports that she drinks alcohol. She reports that she does not use drugs.  Allergies:  Allergies  Allergen Reactions  . Hycodan [Hydrocodone-Homatropine] Hives  . Other     Unknown cough syrup    No prescriptions prior to admission.    No results found for this or any previous visit (from the past 48 hour(s)). No results found.  Review of Systems  Constitutional: Negative.  Negative for chills and fever.  HENT: Negative.   Eyes: Negative.   Respiratory: Negative.   Cardiovascular: Negative.   Gastrointestinal: Negative.   Genitourinary: Negative.   Musculoskeletal: Negative.   Skin: Negative.   Neurological: Negative.   Endo/Heme/Allergies: Negative.   Psychiatric/Behavioral: Negative.     Height 5' 9.5" (1.765 m), weight 98 kg (216 lb). Physical Exam  Constitutional: She appears well-developed.  HENT:  Head: Normocephalic.  Eyes: Pupils are equal, round, and reactive to light.  Neck: Normal range of motion.  Cardiovascular: Normal rate.   Respiratory: Effort normal.  GI: Soft.   Genitourinary:  Genitourinary Comments: No CVAT  Neurological: She is alert.  Skin: Skin is warm.  Psychiatric: She has a normal mood and affect.     Assessment/Plan  Proceed as planned with TURBT / retrogrades. Risks, benefits, alternatives, expected peri-op course discussed previously and reiterated today.   Alexis Frock, MD 11/17/2016, 6:54 AM

## 2016-11-17 NOTE — Anesthesia Postprocedure Evaluation (Signed)
Anesthesia Post Note  Patient: [name]  Procedure(s) Performed: Procedure(s) (LRB): TRANSURETHRAL RESECTION OF BLADDER TUMOR (TURBT) (N/A) CYSTOSCOPY WITH RETROGRADE PYELOGRAM (Bilateral)     Patient location during evaluation: PACU Anesthesia Type: General Level of consciousness: awake and alert Pain management: pain level controlled Vital Signs Assessment: post-procedure vital signs reviewed and stable Respiratory status: spontaneous breathing, nonlabored ventilation, respiratory function stable and patient connected to nasal cannula oxygen Cardiovascular status: blood pressure returned to baseline and stable Postop Assessment: no signs of nausea or vomiting Anesthetic complications: no    Last Vitals:  Vitals:   11/17/16 1645 11/17/16 1700  BP: 139/81 (!) 141/82  Pulse: 78 77  Resp: 18 12  Temp:    SpO2: 95% 94%    Last Pain:  Vitals:   11/17/16 1645  TempSrc:   PainSc: 0-No pain                 Suzanne Kho

## 2016-11-17 NOTE — Anesthesia Preprocedure Evaluation (Addendum)
Anesthesia Evaluation  Patient identified by MRN, date of birth, ID band Patient awake    Reviewed: Allergy & Precautions, NPO status , Patient's Chart, lab work & pertinent test results  Airway Mallampati: I  TM Distance: >3 FB Neck ROM: Full    Dental  (+) Teeth Intact, Dental Advisory Given, Caps,    Pulmonary former smoker,    breath sounds clear to auscultation       Cardiovascular Exercise Tolerance: Good  Rhythm:Regular Rate:Normal     Neuro/Psych Anxiety negative neurological ROS     GI/Hepatic Neg liver ROS,   Endo/Other  negative endocrine ROS  Renal/GU Renal disease     Musculoskeletal negative musculoskeletal ROS (+)   Abdominal (+) + obese,   Peds  Hematology negative hematology ROS (+)   Anesthesia Other Findings Hyperlipidemia  Reproductive/Obstetrics                            Anesthesia Physical  Anesthesia Plan  ASA: II  Anesthesia Plan: General   Post-op Pain Management:    Induction: Intravenous  PONV Risk Score and Plan: 2 and Ondansetron and Dexamethasone  Airway Management Planned: LMA  Additional Equipment:   Intra-op Plan:   Post-operative Plan: Extubation in OR  Informed Consent: I have reviewed the patients History and Physical, chart, labs and discussed the procedure including the risks, benefits and alternatives for the proposed anesthesia with the patient or authorized representative who has indicated his/her understanding and acceptance.   Dental advisory given  Plan Discussed with: CRNA and Surgeon  Anesthesia Plan Comments:         Anesthesia Quick Evaluation

## 2016-11-17 NOTE — Transfer of Care (Signed)
Immediate Anesthesia Transfer of Care Note  Patient: [name]  Procedure(s) Performed: Procedure(s): TRANSURETHRAL RESECTION OF BLADDER TUMOR (TURBT) (N/A) CYSTOSCOPY WITH RETROGRADE PYELOGRAM (Bilateral)  Patient Location: PACU  Anesthesia Type:General  Level of Consciousness: awake, alert , oriented and patient cooperative  Airway & Oxygen Therapy: Patient Spontanous Breathing and Patient connected to nasal cannula oxygen  Post-op Assessment: Report given to RN and Post -op Vital signs reviewed and stable  Post vital signs: Reviewed and stable  Last Vitals:  Vitals:   11/17/16 1354  BP: (!) 162/88  Pulse: 77  Resp: 16  Temp: 36.9 C  SpO2: 96%    Last Pain:  Vitals:   11/17/16 1354  TempSrc: Oral      Patients Stated Pain Goal: 5 (97/02/63 7858)  Complications: No apparent anesthesia complications

## 2016-11-17 NOTE — Anesthesia Procedure Notes (Signed)
Procedure Name: LMA Insertion Date/Time: 11/17/2016 3:51 PM Performed by: Wanita Chamberlain Pre-anesthesia Checklist: Patient identified, Timeout performed, Emergency Drugs available, Suction available and Patient being monitored Patient Re-evaluated:Patient Re-evaluated prior to induction Oxygen Delivery Method: Circle system utilized Preoxygenation: Pre-oxygenation with 100% oxygen Induction Type: IV induction Ventilation: Mask ventilation without difficulty LMA: LMA inserted LMA Size: 4.0 Number of attempts: 1 Placement Confirmation: positive ETCO2 and breath sounds checked- equal and bilateral Tube secured with: Tape Dental Injury: Teeth and Oropharynx as per pre-operative assessment

## 2016-11-20 ENCOUNTER — Ambulatory Visit (INDEPENDENT_AMBULATORY_CARE_PROVIDER_SITE_OTHER): Payer: Medicare Other

## 2016-11-20 ENCOUNTER — Encounter (HOSPITAL_BASED_OUTPATIENT_CLINIC_OR_DEPARTMENT_OTHER): Payer: Self-pay | Admitting: Urology

## 2016-11-20 ENCOUNTER — Ambulatory Visit (INDEPENDENT_AMBULATORY_CARE_PROVIDER_SITE_OTHER): Payer: Self-pay | Admitting: Podiatry

## 2016-11-20 VITALS — BP 157/105 | HR 78 | Resp 16

## 2016-11-20 DIAGNOSIS — M2012 Hallux valgus (acquired), left foot: Secondary | ICD-10-CM

## 2016-11-20 NOTE — Op Note (Signed)
NAME:  [name], [name]                ACCOUNT NO.:  MEDICAL RECORD NO.:  [id number]  LOCATION:                                 FACILITY:  PHYSICIAN:  Alexis Frock, MD          DATE OF BIRTH:   DATE OF PROCEDURE: 11/17/2016                               OPERATIVE REPORT   PREOPERATIVE DIAGNOSIS:  Small-volume recurrent bladder cancer.  POSTOPERATIVE DIAGNOSIS:  Small-volume recurrent bladder cancer.  PROCEDURES: 1. Cystoscopy with bilateral retrograde pyelograms and interpretation. 2. Transurethral resection of bladder tumor, volume small.  ESTIMATED BLOOD LOSS:  Nil.  COMPLICATION:  None.  SPECIMEN: 1. Bladder tumor for permanent pathology. 2. Base of bladder tumor for permanent pathology.  FINDINGS: 1. Small volume papillary bladder tumor, recurrent, mostly left and     right lateral wall, total volume approximately 1.5 cm. 2. Unremarkable bilateral retrograde pyelograms. 3. Numerous old resection sites without obvious bladder tumor     recurrence.  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  She is status post transurethral resection many times.  Her most recent recurrences have fortunately been low grade.  She was found on surveillance cystoscopy to have what appeared to be a papillary tumor recurrence in the bladder relatively small volume.  Options were discussed including continued surveillance versus transurethral resection and she wished to proceed with the latter.  Informed consent was obtained and placed in the medical record.  PROCEDURE IN DETAIL:  The patient being [name], was verified. Procedure being transurethral resection of bladder tumor with retrogrades was confirmed.  Procedure was carried out.  Time-out was performed.  Intravenous antibiotics were administered.  General LMA anesthesia introduced.  The patient was placed into a low  lithotomy position and sterile field was created by prepping and draping the patient's vagina, introitus and proximal thighs using iodine.  Next, cystourethroscopy was performed using a 22-French rigid cystoscope with offset lens.  Inspection of the urinary bladder revealed multifocal small volume recurrent bladder tumor, left wall approximately 1 cm, right wall approximately 1.5 cm, both were superolateral to the ureteral orifices, these appeared to be new foci of tumor away from other resection site, had no obvious recurrence.  Attention was then directed to retrograde pyelogram.  The left ureteral orifice was cannulated with a 6-French end-hole catheter and left retrograde pyelogram was obtained.  Left retrograde pyelogram demonstrates a single left ureter with single- system left kidney.  No filling defects or narrowing noted.  Similarly, right retrograde pyelogram was obtained.  Right retrograde pyelogram demonstrated a single right ureter with single-system right kidney.  No filling defects or narrowing noted.  The cystoscope was then exchanged to 26-French resectoscope sheath with visual obturator, and using large-sized resectoscope loop, very careful resection was performed of the areas of bladder tumor down what appeared to be the fibromuscular stroma of the superficial bladder.  These tissue fragments were irrigated and set aside, labeled as bladder tumor.  Next, cold-cup biopsy forceps were used to obtain representative seromuscular bites what appeared to be muscularis propria of the bladder.  At this site, taking exquisite  care to avoid bladder perforation, which did not occur.  These separate sites were set aside, labeled as base of bladder tumor, also for permanent pathology.  The base of the areas were fulgurated with coagulation current.  Following these maneuvers, there was excellent hemostasis, no evidence of perforation.  There was complete resolution of all obvious  recurrent bladder tumor.  Bladder was emptied per cystoscope.  Procedure was then terminated.  The patient tolerated the procedure well.  There were no immediate periprocedural complications.  The patient was taken to the postanesthesia care unit in stable condition.          ______________________________ Alexis Frock, MD     TM/MEDQ  D:  11/17/2016  T:  11/18/2016  Job:  [id number]

## 2016-11-22 NOTE — Progress Notes (Signed)
Subjective:    Patient ID: [name], female   DOB: 71 y.o.   MRN: [id number]   HPI patient states that her foot feels real good    ROS      Objective:  Physical Exam neurovascular status intact negative Homans sign noted with patient's left first MPJ doing well with good range of motion no crepitus in the joint and fifth toe looks good with small amount of swelling still noted but insignificant currently due to healing     Assessment:  Doing well post osteotomy left       Plan:  Reviewed x-rays and that secondary bone healing has commenced and healed with the joint being relatively congruence and open. Fifth digit looks good and conveyed all findings to patient and patient's discharge and will be seen as needed  X-ray indicated there had been previous fracture but has consolidated at the current time with no apparent sequela

## 2016-12-05 DIAGNOSIS — C675 Malignant neoplasm of bladder neck: Secondary | ICD-10-CM | POA: Diagnosis not present

## 2016-12-05 DIAGNOSIS — R310 Gross hematuria: Secondary | ICD-10-CM | POA: Diagnosis not present

## 2017-03-06 DIAGNOSIS — C678 Malignant neoplasm of overlapping sites of bladder: Secondary | ICD-10-CM | POA: Diagnosis not present

## 2017-03-21 ENCOUNTER — Other Ambulatory Visit: Payer: Self-pay

## 2017-03-21 ENCOUNTER — Encounter: Payer: Self-pay | Admitting: Family Medicine

## 2017-03-21 ENCOUNTER — Ambulatory Visit (INDEPENDENT_AMBULATORY_CARE_PROVIDER_SITE_OTHER): Payer: Medicare Other | Admitting: Family Medicine

## 2017-03-21 VITALS — BP 140/90 | HR 85 | Temp 98.0°F | Resp 16 | Ht 69.29 in | Wt 219.0 lb

## 2017-03-21 DIAGNOSIS — Z23 Encounter for immunization: Secondary | ICD-10-CM

## 2017-03-21 DIAGNOSIS — Z803 Family history of malignant neoplasm of breast: Secondary | ICD-10-CM | POA: Diagnosis not present

## 2017-03-21 DIAGNOSIS — E6609 Other obesity due to excess calories: Secondary | ICD-10-CM | POA: Diagnosis not present

## 2017-03-21 DIAGNOSIS — C679 Malignant neoplasm of bladder, unspecified: Secondary | ICD-10-CM | POA: Diagnosis not present

## 2017-03-21 DIAGNOSIS — Z0000 Encounter for general adult medical examination without abnormal findings: Secondary | ICD-10-CM

## 2017-03-21 DIAGNOSIS — E7439 Other disorders of intestinal carbohydrate absorption: Secondary | ICD-10-CM | POA: Diagnosis not present

## 2017-03-21 DIAGNOSIS — R030 Elevated blood-pressure reading, without diagnosis of hypertension: Secondary | ICD-10-CM

## 2017-03-21 DIAGNOSIS — Z808 Family history of malignant neoplasm of other organs or systems: Secondary | ICD-10-CM | POA: Diagnosis not present

## 2017-03-21 DIAGNOSIS — Z6832 Body mass index (BMI) 32.0-32.9, adult: Secondary | ICD-10-CM

## 2017-03-21 DIAGNOSIS — E7800 Pure hypercholesterolemia, unspecified: Secondary | ICD-10-CM | POA: Diagnosis not present

## 2017-03-21 MED ORDER — ZOSTER VAC RECOMB ADJUVANTED 50 MCG/0.5ML IM SUSR: 0.5000 mL | Freq: Once | INTRAMUSCULAR | 1 refills | Status: AC

## 2017-03-21 NOTE — Progress Notes (Signed)
Subjective:    Patient ID: [name], female    DOB: Aug 20, 1945, 71 y.o.   MRN: [id number]  03/21/2017  Annual Exam    HPI This 71 y.o. female presents for  Complete Physical Examination.  Last physical:  12-2015 Pap smear: 2015 WNL Mammogram:  09-22-2016 Colonoscopy: 2014 Bone density:  2016 WNL; repeat five years Eye exam:  Glasses; due. Annually. Dental exam:  Every six months.   Bunionectomy in January 2018.  Previous bunion resection several years ago.  Bladder cancer: recurrence with resection; followed every three months.    Skin survey this year and WNL.  Hypercholesterolemia: due for repeat.    Visual Acuity Screening   Right eye Left eye Both eyes  Without correction:     With correction: 20/20 20/20 20/20     BP Readings from Last 3 Encounters:  03/21/17 140/90  11/20/16 (!) 157/105  11/17/16 (!) 146/85   Wt Readings from Last 3 Encounters:  03/21/17 219 lb (99.3 kg)  11/17/16 213 lb (96.6 kg)  07/05/16 216 lb 9.6 oz (98.2 kg)   Immunization History  Administered Date(s) Administered  . Influenza,inj,Quad PF,6+ Mos 03/02/2014, 12/25/2014, 12/28/2015, 03/21/2017  . Pneumococcal Conjugate-13 03/02/2014  . Pneumococcal Polysaccharide-23 12/25/2014    Review of Systems  Constitutional: Negative for activity change, appetite change, chills, diaphoresis, fatigue, fever and unexpected weight change.  HENT: Negative for congestion, dental problem, drooling, ear discharge, ear pain, facial swelling, hearing loss, mouth sores, nosebleeds, postnasal drip, rhinorrhea, sinus pressure, sneezing, sore throat, tinnitus, trouble swallowing and voice change.   Eyes: Negative for photophobia, pain, discharge, redness, itching and visual disturbance.  Respiratory: Negative for apnea, cough, choking, chest tightness, shortness of breath, wheezing and stridor.   Cardiovascular: Negative for chest pain, palpitations and leg swelling.  Gastrointestinal:  Negative for abdominal distention, abdominal pain, anal bleeding, blood in stool, constipation, diarrhea, nausea, rectal pain and vomiting.  Endocrine: Negative for cold intolerance, heat intolerance, polydipsia, polyphagia and polyuria.  Genitourinary: Negative for decreased urine volume, difficulty urinating, dyspareunia, dysuria, enuresis, flank pain, frequency, genital sores, hematuria, menstrual problem, pelvic pain, urgency, vaginal bleeding, vaginal discharge and vaginal pain.       Nocturia x 0.  Urinary leakage none.  Musculoskeletal: Negative for arthralgias, back pain, gait problem, joint swelling, myalgias, neck pain and neck stiffness.  Skin: Negative for color change, pallor, rash and wound.  Allergic/Immunologic: Negative for environmental allergies, food allergies and immunocompromised state.  Neurological: Negative for dizziness, tremors, seizures, syncope, facial asymmetry, speech difficulty, weakness, light-headedness, numbness and headaches.  Hematological: Negative for adenopathy. Does not bruise/bleed easily.  Psychiatric/Behavioral: Negative for agitation, behavioral problems, confusion, decreased concentration, dysphoric mood, hallucinations, self-injury, sleep disturbance and suicidal ideas. The patient is not nervous/anxious and is not hyperactive.        Bedtime 1000; wakes up 500.    Past Medical History:  Diagnosis Date  . Bladder cancer ([place]  . Duplex kidney    RIGHT  . History of adenomatous polyp of colon   . History of recurrent UTIs   . Hyperlipidemia   . Renal cyst, left    NON-COMPLEX  . Wears contact lenses    Past Surgical History:  Procedure Laterality Date  . BENIGN BREAST BX Right 1995  . BUNIONECTOMY Right 2000  . COLONOSCOPY W/ POLYPECTOMY  01/06/2013   single sessile polyp ascending colon.  [facility].  Marland Kitchen CYSTOSCOPY W/ RETROGRADES Bilateral 12/10/2013   Procedure:  RETROGRADE PYELOGRAM  ;  Surgeon: Alexis Frock, MD;  Location: [facility];  Service: Urology;  Laterality: Bilateral;  . CYSTOSCOPY W/ RETROGRADES Bilateral 07/15/2014   Procedure: CYSTOSCOPY WITH RETROGRADE PYELOGRAM AND INSTILLATION OF MITOMYCIN C;  Surgeon: Alexis Frock, MD;  Location: [facility];  Service: Urology;  Laterality: Bilateral;  . CYSTOSCOPY W/ RETROGRADES Bilateral 12/31/2015   Procedure: CYSTOSCOPY WITH RETROGRADE PYELOGRAM;  Surgeon: Alexis Frock, MD;  Location: [facility];  Service: Urology;  Laterality: Bilateral;  . CYSTOSCOPY W/ RETROGRADES Bilateral 11/17/2016   Procedure: CYSTOSCOPY WITH RETROGRADE PYELOGRAM;  Surgeon: Alexis Frock, MD;  Location: [facility];  Service: Urology;  Laterality: Bilateral;  . CYSTOSCOPY W/ URETERAL STENT PLACEMENT Bilateral 10/22/2013   Procedure: CYSTOSCOPY WITH RETROGRADE PYELOGRAM;  Surgeon: Alexis Frock, MD;  Location: [facility];  Service: Urology;  Laterality: Bilateral;  . CYSTOSCOPY WITH RETROGRADE PYELOGRAM, URETEROSCOPY AND STENT PLACEMENT Bilateral 04/14/2015   Procedure: CYSTOSCOPY WITH RETROGRADE PYELOGRAM, URETEROSCOPY AND RIGHT STENT PLACEMENT;  Surgeon: Alexis Frock, MD;  Location: [facility];  Service: Urology;  Laterality: Bilateral;  . TONSILLECTOMY  52   (age 39)  . TRANSURETHRAL RESECTION OF BLADDER TUMOR N/A 12/31/2015   Procedure: TRANSURETHRAL RESECTION OF BLADDER TUMOR (TURBT) WITH GYRUS;  Surgeon: Alexis Frock, MD;  Location: [facility];  Service: Urology;  Laterality: N/A;  . TRANSURETHRAL RESECTION OF BLADDER TUMOR N/A 11/17/2016   Procedure: TRANSURETHRAL RESECTION OF BLADDER TUMOR (TURBT);  Surgeon: Alexis Frock, MD;  Location: [facility];  Service: Urology;  Laterality: N/A;  . TRANSURETHRAL RESECTION OF BLADDER TUMOR WITH GYRUS (TURBT-GYRUS) N/A 10/22/2013   Procedure: TRANSURETHRAL RESECTION OF BLADDER TUMOR WITH ERBE.;   Surgeon: Alexis Frock, MD;  Location: [facility];  Service: Urology;  Laterality: N/A;  . TRANSURETHRAL RESECTION OF BLADDER TUMOR WITH GYRUS (TURBT-GYRUS) N/A 12/10/2013   Procedure: RESTAGING TRANSURETHRAL RESECTION OF BLADDER TUMOR WITH GYRUS (TURBT-GYRUS);  Surgeon: Alexis Frock, MD;  Location: [facility];  Service: Urology;  Laterality: N/A;  . TRANSURETHRAL RESECTION OF BLADDER TUMOR WITH GYRUS (TURBT-GYRUS) N/A 07/15/2014   Procedure: TRANSURETHRAL RESECTION OF BLADDER TUMOR WITH GYRUS (TURBT-GYRUS);  Surgeon: Alexis Frock, MD;  Location: [facility];  Service: Urology;  Laterality: N/A;  . TRANSURETHRAL RESECTION OF BLADDER TUMOR WITH GYRUS (TURBT-GYRUS) N/A 04/14/2015   Procedure: TRANSURETHRAL RESECTION OF BLADDER TUMOR WITH GYRUS (TURBT-GYRUS);  Surgeon: Alexis Frock, MD;  Location: [facility];  Service: Urology;  Laterality: N/A;   Allergies  Allergen Reactions  . Hycodan [Hydrocodone-Homatropine] Hives  . Other     Unknown cough syrup   No current outpatient medications on file prior to visit.   No current facility-administered medications on file prior to visit.    Social History   Socioeconomic History  . Marital status: Divorced    Spouse name: Not on file  . Number of children: 3  . Years of education: Not on file  . Highest education level: Not on file  Social Needs  . Financial resource strain: Not on file  . Food insecurity - worry: Not on file  . Food insecurity - inability: Not on file  . Transportation needs - medical: Not on file  . Transportation needs - non-medical: Not on file  Occupational History  . 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  Tobacco Use  . Smoking status: Former Smoker  Packs/day: 0.15    Years: 20.00    Pack years: 3.00    Types: Cigarettes    Last attempt to quit: 10/17/2012    Years since quitting: 4.4  .  Smokeless tobacco: Never Used  Substance and Sexual Activity  . Alcohol use: Yes    Comment: OCCASIONAL  . Drug use: No  . Sexual activity: Not Currently    Birth control/protection: Post-menopausal  Other Topics Concern  . Not on file  Social History Narrative   Marital status:  Divorced; not dating in 2018.      Children:  3 children; 2 grandchildren (5,4); family within walking distance.        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      Tobacco:  None; quit in 09/2012.      Alcohol: 1 drink per week.       Drugs:  None      Exercise:  One hour of water aerobics 3-4 days per week.      Seatbelt: 100%      Guns:  none      ADLs: independent with all ADLs; drives; no assistant device.      Advanced Directives:  Yes; FULL CODE; no prolonged measures.   Family History  Problem Relation Age of Onset  . Hypertension Mother   . Arthritis Mother        knee replacement; shoulder replacement  . Heart disease Mother        AAA; mild AMI in 2018  . Stroke Mother 35       TIA  . Cancer Father        prostate cancer  . Cancer Brother        prostate cancer  . Cancer Brother 50       Melanoma  . Mental illness Sister 67       bipolar disorder       Objective:    BP 140/90   Pulse 85   Temp 98 F (36.7 C) (Oral)   Resp 16   Ht 5' 9.29" (1.76 m)   Wt 219 lb (99.3 kg)   SpO2 97%   BMI 32.07 kg/m  Physical Exam  Constitutional: She is oriented to person, place, and time. She appears well-developed and well-nourished. No distress.  HENT:  Head: Normocephalic and atraumatic.  Right Ear: External ear normal.  Left Ear: External ear normal.  Nose: Nose normal.  Mouth/Throat: Oropharynx is clear and moist.  Eyes: Conjunctivae and EOM are normal. Pupils are equal, round, and reactive to light.  Neck: Normal range of motion and full passive range of motion without pain. Neck supple. No JVD present. Carotid bruit is  not present. No thyromegaly present.  Cardiovascular: Normal rate, regular rhythm and normal heart sounds. Exam reveals no gallop and no friction rub.  No murmur heard. Pulmonary/Chest: Effort normal and breath sounds normal. She has no wheezes. She has no rales. Right breast exhibits no inverted nipple, no mass, no nipple discharge, no skin change and no tenderness. Left breast exhibits no inverted nipple, no mass, no nipple discharge, no skin change and no tenderness. Breasts are symmetrical.  Abdominal: Soft. Bowel sounds are normal. She exhibits no distension and no mass. There is no tenderness. There is no rebound and no guarding.  Musculoskeletal:       Right shoulder: Normal.       Left shoulder: Normal.  Cervical back: Normal.  Lymphadenopathy:    She has no cervical adenopathy.  Neurological: She is alert and oriented to person, place, and time. She has normal reflexes. No cranial nerve deficit. She exhibits normal muscle tone. Coordination normal.  Skin: Skin is warm and dry. No rash noted. She is not diaphoretic. No erythema. No pallor.  Psychiatric: She has a normal mood and affect. Her behavior is normal. Judgment and thought content normal.  Nursing note and vitals reviewed.  No results found. Depression screen [facility] 2/9 03/21/2017 07/05/2016 12/28/2015 10/02/2015 08/23/2015  Decreased Interest 0 0 0 0 0  Down, Depressed, Hopeless 0 0 0 0 0  PHQ - 2 Score 0 0 0 0 0   Fall Risk  03/21/2017 07/05/2016 12/28/2015 10/02/2015 08/23/2015  Falls in the past year? No No No No No        Assessment & Plan:   1. Routine physical examination   2. Pure hypercholesterolemia   3. Need for prophylactic vaccination and inoculation against influenza   4. Glucose intolerance   5. Family history of breast cancer in sister   60. Family history of melanoma   7. Blood pressure elevated without history of HTN   8. Malignant neoplasm of urinary bladder, unspecified site (HCC) Chronic  9. Class 1  obesity due to excess calories with serious comorbidity and body mass index (BMI) of 32.0 to 32.9 in adult     -anticipatory guidance provided --- exercise, weight loss, safe driving practices, aspirin 81mg  daily. -obtain age appropriate screening labs and labs for chronic disease management. -persistent blood pressure reading that is borderline; pt desires six month trial of exercise, weight loss, and low-sodium diet. -recurrence of bladder cancer in the past year s/p treatment; followed closely by urology.  Doing well.   -s/p recent dermatology evaluation due to family hx of melanoma. - I recommend weight loss, exercise, and low-carbohydrate low-sugar food choices. You should AVOID: regular sodas, sweetened tea, fruit juices.  You should LIMIT: breads, pastas, rice, potatoes, and desserts/sweets.  I would recommend limiting your total carbohydrate intake per meal to 45 grams; I would limit your total carbohydrate intake per snack to 30 grams.  I would also have a goal of 60 grams of protein intake per day; this would equal 10-15 grams of protein per meal and 5-10 grams of protein per snack. -recommend weight loss, exercise for 30-60 minutes five days per week; recommend 1500 kcal restriction per day with a minimum of 60 grams of protein per day. - I recommend weight loss, exercise, and low-cholesterol low-fat food choices.  I recommend limiting red meat to once per week; I recommend limiting fried foods to once per month.   Orders Placed This Encounter  Procedures  . Flu Vaccine QUAD 36+ mos IM  . CBC with Differential/Platelet  . Comprehensive metabolic panel    Order Specific Question:   Has the patient fasted?    Answer:   No  . Hemoglobin A1c  . Lipid panel    Order Specific Question:   Has the patient fasted?    Answer:   No  . TSH  . Care order/instruction:    Please recheck BP.  Marland Kitchen POCT urinalysis dipstick   Meds ordered this encounter  Medications  . Zoster Vaccine Adjuvanted  [facility]) injection    Sig: Inject 0.5 mLs into the muscle once for 1 dose.    Dispense:  0.5 mL    Refill:  1    Return  in about 6 months (around 09/19/2017) for recheck.   Kristi Elayne Guerin, M.D. Primary Care at [facility] previously [facility] [place] [address] [place],   [address] [phone] phone [phone] fax'

## 2017-03-21 NOTE — Patient Instructions (Addendum)
   IF you received an x-ray today, you will receive an invoice from [facility]. Please contact [facility] at [phone] with questions or concerns regarding your invoice.   IF you received labwork today, you will receive an invoice from [facility]. Please contact [facility] at [phone] with questions or concerns regarding your invoice.   Our billing staff will not be able to assist you with questions regarding bills from these companies.  You will be contacted with the lab results as soon as they are available. The fastest way to get your results is to activate your My Chart account. Instructions are located on the last page of this paperwork. If you have not heard from us regarding the results in 2 weeks, please contact this office.      Preventive Care 65 Years and Older, Female Preventive care refers to lifestyle choices and visits with your health care provider that can promote health and wellness. What does preventive care include?  A yearly physical exam. This is also called an annual well check.  Dental exams once or twice a year.  Routine eye exams. Ask your health care provider how often you should have your eyes checked.  Personal lifestyle choices, including: ? Daily care of your teeth and gums. ? Regular physical activity. ? Eating a healthy diet. ? Avoiding tobacco and drug use. ? Limiting alcohol use. ? Practicing safe sex. ? Taking low-dose aspirin every day. ? Taking vitamin and mineral supplements as recommended by your health care provider. What happens during an annual well check? The services and screenings done by your health care provider during your annual well check will depend on your age, overall health, lifestyle risk factors, and family history of disease. Counseling Your health care provider may ask you questions about your:  Alcohol use.  Tobacco use.  Drug use.  Emotional well-being.  Home and relationship  well-being.  Sexual activity.  Eating habits.  History of falls.  Memory and ability to understand (cognition).  Work and work environment.  Reproductive health.  Screening You may have the following tests or measurements:  Height, weight, and BMI.  Blood pressure.  Lipid and cholesterol levels. These may be checked every 5 years, or more frequently if you are over 50 years old.  Skin check.  Lung cancer screening. You may have this screening every year starting at age 55 if you have a 30-pack-year history of smoking and currently smoke or have quit within the past 15 years.  Fecal occult blood test (FOBT) of the stool. You may have this test every year starting at age 50.  Flexible sigmoidoscopy or colonoscopy. You may have a sigmoidoscopy every 5 years or a colonoscopy every 10 years starting at age 50.  Hepatitis C blood test.  Hepatitis B blood test.  Sexually transmitted disease (STD) testing.  Diabetes screening. This is done by checking your blood sugar (glucose) after you have not eaten for a while (fasting). You may have this done every 1-3 years.  Bone density scan. This is done to screen for osteoporosis. You may have this done starting at age 69.  Mammogram. This may be done every 1-2 years. Talk to your health care provider about how often you should have regular mammograms.  Talk with your health care provider about your test results, treatment options, and if necessary, the need for more tests. Vaccines Your health care provider may recommend certain vaccines, such as:  Influenza vaccine. This is recommended every year.    Tetanus, diphtheria, and acellular pertussis (Tdap, Td) vaccine. You may need a Td booster every 10 years.  Varicella vaccine. You may need this if you have not been vaccinated.  Zoster vaccine. You may need this after age 60.  Measles, mumps, and rubella (MMR) vaccine. You may need at least one dose of MMR if you were born in  1955 or later. You may also need a second dose.  Pneumococcal 13-valent conjugate (PCV13) vaccine. One dose is recommended after age 69.  Pneumococcal polysaccharide (PPSV23) vaccine. One dose is recommended after age 69.  Meningococcal vaccine. You may need this if you have certain conditions.  Hepatitis A vaccine. You may need this if you have certain conditions or if you travel or work in places where you may be exposed to hepatitis A.  Hepatitis B vaccine. You may need this if you have certain conditions or if you travel or work in places where you may be exposed to hepatitis B.  Haemophilus influenzae type b (Hib) vaccine. You may need this if you have certain conditions.  Talk to your health care provider about which screenings and vaccines you need and how often you need them. This information is not intended to replace advice given to you by your health care provider. Make sure you discuss any questions you have with your health care provider. Document Released: 04/23/2015 Document Revised: 12/15/2015 Document Reviewed: 01/26/2015 Elsevier Interactive Patient Education  2017 Elsevier Inc.  

## 2017-03-22 LAB — COMPREHENSIVE METABOLIC PANEL
A/G RATIO: 1.7 (ref 1.2–2.2)
ALBUMIN: 4.5 g/dL (ref 3.6–4.8)
ALT: 31 IU/L (ref 0–32)
AST: 30 IU/L (ref 0–40)
Alkaline Phosphatase: 94 IU/L (ref 39–117)
BUN / CREAT RATIO: 15 (ref 12–28)
BUN: 10 mg/dL (ref 8–27)
Bilirubin Total: 0.4 mg/dL (ref 0.0–1.2)
CALCIUM: 9.7 mg/dL (ref 8.7–10.3)
CO2: 25 mmol/L (ref 20–29)
Chloride: 101 mmol/L (ref 96–106)
Creatinine, Ser: 0.66 mg/dL (ref 0.57–1.00)
GFR, EST AFRICAN AMERICAN: 104 mL/min/{1.73_m2} (ref >59)
GFR, EST NON AFRICAN AMERICAN: 90 mL/min/{1.73_m2} (ref >59)
GLOBULIN, TOTAL: 2.7 g/dL (ref 1.5–4.5)
Glucose: 105 mg/dL — ABNORMAL HIGH (ref 65–99)
POTASSIUM: 3.7 mmol/L (ref 3.5–5.2)
Sodium: 141 mmol/L (ref 134–144)
TOTAL PROTEIN: 7.2 g/dL (ref 6.0–8.5)

## 2017-03-22 LAB — CBC WITH DIFFERENTIAL/PLATELET
Basophils Absolute: 0 10*3/uL (ref 0.0–0.2)
Basos: 1 %
EOS (ABSOLUTE): 0.1 10*3/uL (ref 0.0–0.4)
Eos: 2 %
HEMATOCRIT: 41.1 % (ref 34.0–46.6)
HEMOGLOBIN: 13.8 g/dL (ref 11.1–15.9)
IMMATURE GRANS (ABS): 0 10*3/uL (ref 0.0–0.1)
IMMATURE GRANULOCYTES: 0 %
LYMPHS: 42 %
Lymphocytes Absolute: 2.3 10*3/uL (ref 0.7–3.1)
MCH: 32 pg (ref 26.6–33.0)
MCHC: 33.6 g/dL (ref 31.5–35.7)
MCV: 95 fL (ref 79–97)
Monocytes Absolute: 0.5 10*3/uL (ref 0.1–0.9)
Monocytes: 9 %
NEUTROS ABS: 2.5 10*3/uL (ref 1.4–7.0)
Neutrophils: 46 %
PLATELETS: 324 10*3/uL (ref 150–379)
RBC: 4.31 x10E6/uL (ref 3.77–5.28)
RDW: 12.7 % (ref 12.3–15.4)
WBC: 5.4 10*3/uL (ref 3.4–10.8)

## 2017-03-22 LAB — TSH: TSH: 1.97 u[IU]/mL (ref 0.450–4.500)

## 2017-03-22 LAB — LIPID PANEL
CHOLESTEROL TOTAL: 237 mg/dL — AB (ref 100–199)
Chol/HDL Ratio: 4.8 ratio — ABNORMAL HIGH (ref 0.0–4.4)
HDL: 49 mg/dL (ref >39)
LDL Calculated: 136 mg/dL — ABNORMAL HIGH (ref 0–99)
TRIGLYCERIDES: 262 mg/dL — AB (ref 0–149)
VLDL CHOLESTEROL CAL: 52 mg/dL — AB (ref 5–40)

## 2017-03-22 LAB — HEMOGLOBIN A1C
ESTIMATED AVERAGE GLUCOSE: 114 mg/dL
HEMOGLOBIN A1C: 5.6 % (ref 4.8–5.6)

## 2017-03-27 DIAGNOSIS — E6609 Other obesity due to excess calories: Secondary | ICD-10-CM | POA: Insufficient documentation

## 2017-03-27 DIAGNOSIS — Z6832 Body mass index (BMI) 32.0-32.9, adult: Secondary | ICD-10-CM

## 2017-03-27 DIAGNOSIS — E66811 Obesity, class 1: Secondary | ICD-10-CM | POA: Insufficient documentation

## 2017-03-27 NOTE — Progress Notes (Signed)
Subjective:    Patient ID: [name], female    DOB: 1946/02/06, 71 y.o.   MRN: [id number]  03/21/2017  Annual Exam    HPI This 71 y.o. female presents for [facility].  Health Maintenance  Topic Date Due  . TETANUS/TDAP  10/05/1966  . MAMMOGRAM  09/23/2018  . COLONOSCOPY  01/07/2023  . INFLUENZA VACCINE  Completed  . DEXA SCAN  Completed  . Hepatitis C Screening  Completed  . PNA vac Low Risk Adult  Completed     Visual Acuity Screening   Right eye Left eye Both eyes  Without correction:     With correction: 20/20 20/20 20/20     BP Readings from Last 3 Encounters:  03/21/17 140/90  11/20/16 (!) 157/105  11/17/16 (!) 146/85   Wt Readings from Last 3 Encounters:  03/21/17 219 lb (99.3 kg)  11/17/16 213 lb (96.6 kg)  07/05/16 216 lb 9.6 oz (98.2 kg)   Immunization History  Administered Date(s) Administered  . Influenza,inj,Quad PF,6+ Mos 03/02/2014, 12/25/2014, 12/28/2015, 03/21/2017  . Pneumococcal Conjugate-13 03/02/2014  . Pneumococcal Polysaccharide-23 12/25/2014    Review of Systems  Constitutional: Negative for activity change, appetite change, chills, diaphoresis, fatigue, fever and unexpected weight change.  HENT: Negative for congestion, dental problem, drooling, ear discharge, ear pain, facial swelling, hearing loss, mouth sores, nosebleeds, postnasal drip, rhinorrhea, sinus pressure, sneezing, sore throat, tinnitus, trouble swallowing and voice change.   Eyes: Negative for photophobia, pain, discharge, redness, itching and visual disturbance.  Respiratory: Negative for apnea, cough, choking, chest tightness, shortness of breath, wheezing and stridor.   Cardiovascular: Negative for chest pain, palpitations and leg swelling.  Gastrointestinal: Negative for abdominal distention, abdominal pain, anal bleeding, blood in stool, constipation, diarrhea, nausea, rectal pain and vomiting.  Endocrine: Negative for cold  intolerance, heat intolerance, polydipsia, polyphagia and polyuria.  Genitourinary: Negative for decreased urine volume, difficulty urinating, dyspareunia, dysuria, enuresis, flank pain, frequency, genital sores, hematuria, menstrual problem, pelvic pain, urgency, vaginal bleeding, vaginal discharge and vaginal pain.  Musculoskeletal: Negative for arthralgias, back pain, gait problem, joint swelling, myalgias, neck pain and neck stiffness.  Skin: Negative for color change, pallor, rash and wound.  Allergic/Immunologic: Negative for environmental allergies, food allergies and immunocompromised state.  Neurological: Negative for dizziness, tremors, seizures, syncope, facial asymmetry, speech difficulty, weakness, light-headedness, numbness and headaches.  Hematological: Negative for adenopathy. Does not bruise/bleed easily.  Psychiatric/Behavioral: Negative for agitation, behavioral problems, confusion, decreased concentration, dysphoric mood, hallucinations, self-injury, sleep disturbance and suicidal ideas. The patient is not nervous/anxious and is not hyperactive.     Past Medical History:  Diagnosis Date  . Bladder cancer ([place]) [place]  . Duplex kidney    RIGHT  . History of adenomatous polyp of colon   . History of recurrent UTIs   . Hyperlipidemia   . Renal cyst, left    NON-COMPLEX  . Wears contact lenses    Past Surgical History:  Procedure Laterality Date  . BENIGN BREAST BX Right 1995  . BUNIONECTOMY Right 2000  . COLONOSCOPY W/ POLYPECTOMY  01/06/2013   single sessile polyp ascending colon.  [facility].  Marland Kitchen CYSTOSCOPY W/ RETROGRADES Bilateral 12/10/2013   Procedure: RETROGRADE PYELOGRAM  ;  Surgeon: Alexis Frock, MD;  Location: [facility];  Service: Urology;  Laterality: Bilateral;  . CYSTOSCOPY W/ RETROGRADES Bilateral 07/15/2014   Procedure: CYSTOSCOPY WITH RETROGRADE PYELOGRAM AND INSTILLATION OF MITOMYCIN C;  Surgeon: Alexis Frock,  MD;  Location: [facility];  Service: Urology;  Laterality: Bilateral;  . CYSTOSCOPY W/ RETROGRADES Bilateral 12/31/2015   Procedure: CYSTOSCOPY WITH RETROGRADE PYELOGRAM;  Surgeon: Alexis Frock, MD;  Location[facility];  Service: Urology;  Laterality: Bilateral;  . CYSTOSCOPY W/ RETROGRADES Bilateral 11/17/2016   Procedure: CYSTOSCOPY WITH RETROGRADE PYELOGRAM;  Surgeon: Alexis Frock, MD;  Location[facility];  Service: Urology;  Laterality: Bilateral;  . CYSTOSCOPY W/ URETERAL STENT PLACEMENT Bilateral 10/22/2013   Procedure: CYSTOSCOPY WITH RETROGRADE PYELOGRAM;  Surgeon: Alexis Frock, MD;  Location: [facility];  Service: Urology;  Laterality: Bilateral;  . CYSTOSCOPY WITH RETROGRADE PYELOGRAM, URETEROSCOPY AND STENT PLACEMENT Bilateral 04/14/2015   Procedure: CYSTOSCOPY WITH RETROGRADE PYELOGRAM, URETEROSCOPY AND RIGHT STENT PLACEMENT;  Surgeon: Alexis Frock, MD;  Location: [facility];  Service: Urology;  Laterality: Bilateral;  . TONSILLECTOMY  48   (age 29)  . TRANSURETHRAL RESECTION OF BLADDER TUMOR N/A 12/31/2015   Procedure: TRANSURETHRAL RESECTION OF BLADDER TUMOR (TURBT) WITH GYRUS;  Surgeon: Alexis Frock, MD;  Location: [facility];  Service: Urology;  Laterality: N/A;  . TRANSURETHRAL RESECTION OF BLADDER TUMOR N/A 11/17/2016   Procedure: TRANSURETHRAL RESECTION OF BLADDER TUMOR (TURBT);  Surgeon: Alexis Frock, MD;  Location: [facility];  Service: Urology;  Laterality: N/A;  . TRANSURETHRAL RESECTION OF BLADDER TUMOR WITH GYRUS (TURBT-GYRUS) N/A 10/22/2013   Procedure: TRANSURETHRAL RESECTION OF BLADDER TUMOR WITH ERBE.;  Surgeon: Alexis Frock, MD;  Location: [facility];  Service: Urology;  Laterality: N/A;  . TRANSURETHRAL RESECTION OF BLADDER TUMOR WITH GYRUS (TURBT-GYRUS) N/A 12/10/2013   Procedure: RESTAGING TRANSURETHRAL RESECTION OF BLADDER TUMOR  WITH GYRUS (TURBT-GYRUS);  Surgeon: Alexis Frock, MD;  Location: [facility];  Service: Urology;  Laterality: N/A;  . TRANSURETHRAL RESECTION OF BLADDER TUMOR WITH GYRUS (TURBT-GYRUS) N/A 07/15/2014   Procedure: TRANSURETHRAL RESECTION OF BLADDER TUMOR WITH GYRUS (TURBT-GYRUS);  Surgeon: Alexis Frock, MD;  Location: [facility];  Service: Urology;  Laterality: N/A;  . TRANSURETHRAL RESECTION OF BLADDER TUMOR WITH GYRUS (TURBT-GYRUS) N/A 04/14/2015   Procedure: TRANSURETHRAL RESECTION OF BLADDER TUMOR WITH GYRUS (TURBT-GYRUS);  Surgeon: Alexis Frock, MD;  Location: [facility];  Service: Urology;  Laterality: N/A;   Allergies  Allergen Reactions  . Hycodan [Hydrocodone-Homatropine] Hives  . Other     Unknown cough syrup   No current outpatient medications on file prior to visit.   No current facility-administered medications on file prior to visit.    Social History   Socioeconomic History  . Marital status: Divorced    Spouse name: Not on file  . Number of children: 3  . Years of education: Not on file  . Highest education level: Not on file  Social Needs  . Financial resource strain: Not on file  . Food insecurity - worry: Not on file  . Food insecurity - inability: Not on file  . Transportation needs - medical: Not on file  . Transportation needs - non-medical: Not on file  Occupational History  . 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  Tobacco Use  . Smoking status: Former Smoker    Packs/day: 0.15    Years: 20.00    Pack years: 3.00    Types: Cigarettes    Last attempt to quit: 10/17/2012    Years since quitting: 4.4  . Smokeless tobacco: Never Used  Substance and Sexual Activity  .  Alcohol use: Yes    Comment: OCCASIONAL  . Drug use: No  . Sexual activity: Not Currently    Birth control/protection: Post-menopausal  Other Topics Concern  . Not on file  Social  History Narrative   Marital status:  Divorced; not dating in 2018.      Children:  3 children; 2 grandchildren (5,4); family within walking distance.        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      Tobacco:  None; quit in 09/2012.      Alcohol: 1 drink per week.       Drugs:  None      Exercise:  One hour of water aerobics 3-4 days per week.      Seatbelt: 100%      Guns:  none      ADLs: independent with all ADLs; drives; no assistant device.      Advanced Directives:  Yes; FULL CODE; no prolonged measures.   Family History  Problem Relation Age of Onset  . Hypertension Mother   . Arthritis Mother        knee replacement; shoulder replacement  . Heart disease Mother        AAA; mild AMI in 2018  . Stroke Mother 37       TIA  . Cancer Father        prostate cancer  . Cancer Brother        prostate cancer  . Cancer Brother 50       Melanoma  . Mental illness Sister 39       bipolar disorder       Objective:    BP 140/90   Pulse 85   Temp 98 F (36.7 C) (Oral)   Resp 16   Ht 5' 9.29" (1.76 m)   Wt 219 lb (99.3 kg)   SpO2 97%   BMI 32.07 kg/m  Physical Exam No results found. Depression screen [facility] 2/9 03/21/2017 07/05/2016 12/28/2015 10/02/2015 08/23/2015  Decreased Interest 0 0 0 0 0  Down, Depressed, Hopeless 0 0 0 0 0  PHQ - 2 Score 0 0 0 0 0   Fall Risk  03/21/2017 07/05/2016 12/28/2015 10/02/2015 08/23/2015  Falls in the past year? No No No No No   Functional Status Survey: Is the patient deaf or have difficulty hearing?: No Does the patient have difficulty seeing, even when wearing glasses/contacts?: No Does the patient have difficulty concentrating, remembering, or making decisions?: No Does the patient have difficulty walking or climbing stairs?: No Does the patient have difficulty dressing or bathing?: No Does the patient have difficulty doing errands alone such as visiting a doctor's office or  shopping?: No      Assessment & Plan:   1. Encounter for Medicare annual wellness exam   2. Routine physical examination   3. Pure hypercholesterolemia   4. Need for prophylactic vaccination and inoculation against influenza   5. Glucose intolerance   6. Family history of breast cancer in sister   73. Family history of melanoma   8. Blood pressure elevated without history of HTN   9. Malignant neoplasm of urinary bladder, unspecified site (HCC) Chronic  10. Class 1 obesity due to excess calories with serious comorbidity and body mass index (BMI) of 32.0 to 32.9 in adult     -low fall risk; no evidence of depression; no evidence of hearing loss.  Discussed advanced directives and living will; also discussed end of life issues including code status.  -independent with ADLs.   -reviewed health maintenance items that are overdue; updated influenza vaccine during visit.   Rx for Shingrix provided.  Orders Placed This Encounter  Procedures  . Flu Vaccine QUAD 36+ mos IM  . CBC with Differential/Platelet  . Comprehensive metabolic panel    Order Specific Question:   Has the patient fasted?    Answer:   No  . Hemoglobin A1c  . Lipid panel    Order Specific Question:   Has the patient fasted?    Answer:   No  . TSH  . Care order/instruction:    Please recheck BP.  Marland Kitchen POCT urinalysis dipstick   Meds ordered this encounter  Medications  . Zoster Vaccine Adjuvanted [facility]) injection    Sig: Inject 0.5 mLs into the muscle once for 1 dose.    Dispense:  0.5 mL    Refill:  1    Return in about 6 months (around 09/19/2017) for recheck.   Kento Gossman Elayne Guerin, M.D. Primary Care at [facility] [address] [place], [place]  [address] [phone] phone [phone] fax

## 2017-04-25 ENCOUNTER — Encounter: Payer: Self-pay | Admitting: Family Medicine

## 2017-04-25 ENCOUNTER — Other Ambulatory Visit: Payer: Self-pay | Admitting: Family Medicine

## 2017-04-25 MED ORDER — ATORVASTATIN CALCIUM 10 MG PO TABS: 10.0000 mg | ORAL_TABLET | Freq: Every day | ORAL | 3 refills | Status: DC

## 2017-04-26 ENCOUNTER — Telehealth: Payer: Self-pay | Admitting: *Deleted

## 2017-04-26 DIAGNOSIS — E7800 Pure hypercholesterolemia, unspecified: Secondary | ICD-10-CM

## 2017-04-26 NOTE — Telephone Encounter (Signed)
-----   Message from Wardell Honour, MD sent at 04/25/2017  2:43 PM EST ----- CALL ----  Sugar/glucose is elevated at 105.  Hemoglobin A1c is a 69-month average of sugars.  This level is normal.  No evidence of diabetes. Liver and kidney functions are normal. Thyroid function is normal. Cholesterol is elevated at 237.  Triglycerides are elevated at 262.  LDL cholesterol is elevated at 136.  I recommend starting cholesterol medication.  If agreeable, I have called in a atorvastatin 10 mg once daily.  Please schedule a follow-up visit with me in 3 months to repeat cholesterol levels and to follow-up on medication.

## 2017-04-26 NOTE — Telephone Encounter (Signed)
Dr. Tamala Julian     Patient states she would not like to start atorvastatin at this time.  She states she was not fasting at the time.      She is agreeable to come in have fasting blood work done to check levels again.  Please advise.

## 2017-05-01 NOTE — Telephone Encounter (Signed)
Spoke with pt and gave Dr. Thompson Caul message.  Advised 6-12 weeks with 8-10 hours NPO except water prior to labs.  She verbalized understanding.

## 2017-05-01 NOTE — Telephone Encounter (Signed)
Call --- I have placed a lab order for repeat lipid panel.  Patient may return at her convenience.  I would not return too quickly as insurance will not want to repeat lipid panel too soon. Recommend returning in upcoming 6-12 weeks for fasting cholesterol levels.

## 2017-05-01 NOTE — Addendum Note (Signed)
Addended by: Wardell Honour on: 05/01/2017 10:01 AM   Modules accepted: Orders

## 2017-07-03 DIAGNOSIS — N3020 Other chronic cystitis without hematuria: Secondary | ICD-10-CM | POA: Diagnosis not present

## 2017-07-03 DIAGNOSIS — C678 Malignant neoplasm of overlapping sites of bladder: Secondary | ICD-10-CM | POA: Diagnosis not present

## 2017-09-04 ENCOUNTER — Encounter: Payer: Self-pay | Admitting: Family Medicine

## 2017-09-19 ENCOUNTER — Ambulatory Visit: Payer: Medicare Other | Admitting: Family Medicine

## 2017-10-01 DIAGNOSIS — Q638 Other specified congenital malformations of kidney: Secondary | ICD-10-CM | POA: Diagnosis not present

## 2017-10-01 DIAGNOSIS — C678 Malignant neoplasm of overlapping sites of bladder: Secondary | ICD-10-CM | POA: Diagnosis not present

## 2017-10-01 DIAGNOSIS — N281 Cyst of kidney, acquired: Secondary | ICD-10-CM | POA: Diagnosis not present

## 2017-10-02 DIAGNOSIS — D229 Melanocytic nevi, unspecified: Secondary | ICD-10-CM | POA: Diagnosis not present

## 2017-10-02 DIAGNOSIS — L821 Other seborrheic keratosis: Secondary | ICD-10-CM | POA: Diagnosis not present

## 2017-10-02 DIAGNOSIS — R202 Paresthesia of skin: Secondary | ICD-10-CM | POA: Diagnosis not present

## 2017-10-29 DIAGNOSIS — Z803 Family history of malignant neoplasm of breast: Secondary | ICD-10-CM | POA: Diagnosis not present

## 2017-10-29 DIAGNOSIS — Z1231 Encounter for screening mammogram for malignant neoplasm of breast: Secondary | ICD-10-CM | POA: Diagnosis not present

## 2018-01-02 ENCOUNTER — Other Ambulatory Visit: Payer: Self-pay

## 2018-01-02 ENCOUNTER — Encounter: Payer: Self-pay | Admitting: Family Medicine

## 2018-01-02 ENCOUNTER — Ambulatory Visit: Payer: Medicare Other | Admitting: Family Medicine

## 2018-01-02 VITALS — BP 170/85 | HR 86 | Temp 97.7°F | Ht 69.29 in | Wt 222.0 lb

## 2018-01-02 DIAGNOSIS — I10 Essential (primary) hypertension: Secondary | ICD-10-CM

## 2018-01-02 DIAGNOSIS — E7800 Pure hypercholesterolemia, unspecified: Secondary | ICD-10-CM | POA: Diagnosis not present

## 2018-01-02 DIAGNOSIS — Z23 Encounter for immunization: Secondary | ICD-10-CM | POA: Diagnosis not present

## 2018-01-02 MED ORDER — AMLODIPINE BESYLATE 5 MG PO TABS: 5.0000 mg | ORAL_TABLET | Freq: Every day | ORAL | 3 refills | Status: DC

## 2018-01-02 NOTE — Progress Notes (Signed)
10-17-191:54 PM  [name] 1945/08/11, 72 y.o. female [id number]  Chief Complaint  Patient presents with  . Hypertension    given bp meds by pcp but did not take. Has concerns about the blood pressure    HPI:   Patient is a 72 y.o. female with past medical history significant for HLP who presents today to establish care  Previous PCP Dr Tamala Julian Last visit in dec 2018  Lab Results  Component Value Date   HGBA1C 5.6 2017/04/12   Lab Results  Component Value Date   LDLCALC 136 (H) 2017-04-12   CREATININE 0.66 Apr 12, 2017   ASCVD 10 year risk 12%  Eats pretty healthy Exercises regulary, water aerobic 5 x week A weekly 2 hour vigourous hike Also swims almost every day  Not interested in starting statins Does have BP cuff at home  Stressors over about 2 weeks over father ailing death  Fall Risk  01-24-18 2017-04-12 07/05/2016 12/28/2015 10/02/2015  Falls in the past year? No No No No No     Depression screen [facility] 2/9 01-24-18 2017-04-12 07/05/2016  Decreased Interest 0 0 0  Down, Depressed, Hopeless 0 0 0  PHQ - 2 Score 0 0 0    Allergies  Allergen Reactions  . Hycodan [Hydrocodone-Homatropine] Hives  . Other     Unknown cough syrup    Prior to Admission medications   Medication Sig Start Date End Date Taking? Authorizing Provider  atorvastatin (LIPITOR) 10 MG tablet Take 1 tablet (10 mg total) by mouth daily. Patient not taking: Reported on 01-24-2018 04/25/17   Wardell Honour, MD    Past Medical History:  Diagnosis Date  . Bladder cancer ([place]) [place]  . Duplex kidney    RIGHT  . History of adenomatous polyp of colon   . History of recurrent UTIs   . Hyperlipidemia   . Renal cyst, left    NON-COMPLEX  . Wears contact lenses     Past Surgical History:  Procedure Laterality Date  . BENIGN BREAST BX Right 1995  . BUNIONECTOMY Right 2000  . COLONOSCOPY W/ POLYPECTOMY  01/06/2013   single sessile polyp ascending  colon.  [facility].  Marland Kitchen CYSTOSCOPY W/ RETROGRADES Bilateral 12/10/2013   Procedure: RETROGRADE PYELOGRAM  ;  Surgeon: Alexis Frock, MD;  Location: [facility];  Service: Urology;  Laterality: Bilateral;  . CYSTOSCOPY W/ RETROGRADES Bilateral 07/15/2014   Procedure: CYSTOSCOPY WITH RETROGRADE PYELOGRAM AND INSTILLATION OF MITOMYCIN C;  Surgeon: Alexis Frock, MD;  Location: [facility];  Service: Urology;  Laterality: Bilateral;  . CYSTOSCOPY W/ RETROGRADES Bilateral 12/31/2015   Procedure: CYSTOSCOPY WITH RETROGRADE PYELOGRAM;  Surgeon: Alexis Frock, MD;  Location: [facility];  Service: Urology;  Laterality: Bilateral;  . CYSTOSCOPY W/ RETROGRADES Bilateral 11/17/2016   Procedure: CYSTOSCOPY WITH RETROGRADE PYELOGRAM;  Surgeon: Alexis Frock, MD;  Location: [facility];  Service: Urology;  Laterality: Bilateral;  . CYSTOSCOPY W/ URETERAL STENT PLACEMENT Bilateral 10/22/2013   Procedure: CYSTOSCOPY WITH RETROGRADE PYELOGRAM;  Surgeon: Alexis Frock, MD;  Location: [facility];  Service: Urology;  Laterality: Bilateral;  . CYSTOSCOPY WITH RETROGRADE PYELOGRAM, URETEROSCOPY AND STENT PLACEMENT Bilateral 04/14/2015   Procedure: CYSTOSCOPY WITH RETROGRADE PYELOGRAM, URETEROSCOPY AND RIGHT STENT PLACEMENT;  Surgeon: Alexis Frock, MD;  Location: [facility];  Service: Urology;  Laterality: Bilateral;  . TONSILLECTOMY  17   (age 52)  . TRANSURETHRAL RESECTION OF BLADDER  TUMOR N/A 12/31/2015   Procedure: TRANSURETHRAL RESECTION OF BLADDER TUMOR (TURBT) WITH GYRUS;  Surgeon: Alexis Frock, MD;  Location: [facility];  Service: Urology;  Laterality: N/A;  . TRANSURETHRAL RESECTION OF BLADDER TUMOR N/A 11/17/2016   Procedure: TRANSURETHRAL RESECTION OF BLADDER TUMOR (TURBT);  Surgeon: Alexis Frock, MD;  Location: [facility];  Service: Urology;  Laterality: N/A;  . TRANSURETHRAL RESECTION  OF BLADDER TUMOR WITH GYRUS (TURBT-GYRUS) N/A 10/22/2013   Procedure: TRANSURETHRAL RESECTION OF BLADDER TUMOR WITH ERBE.;  Surgeon: Alexis Frock, MD;  Location: [facility];  Service: Urology;  Laterality: N/A;  . TRANSURETHRAL RESECTION OF BLADDER TUMOR WITH GYRUS (TURBT-GYRUS) N/A 12/10/2013   Procedure: RESTAGING TRANSURETHRAL RESECTION OF BLADDER TUMOR WITH GYRUS (TURBT-GYRUS);  Surgeon: Alexis Frock, MD;  Location: [facility];  Service: Urology;  Laterality: N/A;  . TRANSURETHRAL RESECTION OF BLADDER TUMOR WITH GYRUS (TURBT-GYRUS) N/A 07/15/2014   Procedure: TRANSURETHRAL RESECTION OF BLADDER TUMOR WITH GYRUS (TURBT-GYRUS);  Surgeon: Alexis Frock, MD;  Location: [facility];  Service: Urology;  Laterality: N/A;  . TRANSURETHRAL RESECTION OF BLADDER TUMOR WITH GYRUS (TURBT-GYRUS) N/A 04/14/2015   Procedure: TRANSURETHRAL RESECTION OF BLADDER TUMOR WITH GYRUS (TURBT-GYRUS);  Surgeon: Alexis Frock, MD;  Location: [facility];  Service: Urology;  Laterality: N/A;    Social History   Tobacco Use  . Smoking status: Former Smoker    Packs/day: 0.15    Years: 20.00    Pack years: 3.00    Types: Cigarettes    Last attempt to quit: 10/17/2012    Years since quitting: 5.2  . Smokeless tobacco: Never Used  Substance Use Topics  . Alcohol use: Yes    Comment: OCCASIONAL    Family History  Problem Relation Age of Onset  . Hypertension Mother   . Arthritis Mother        knee replacement; shoulder replacement  . Heart disease Mother        AAA; mild AMI in 2018  . Stroke Mother 71       TIA  . Cancer Father        prostate cancer  . Cancer Brother        prostate cancer  . Cancer Brother 50       Melanoma  . Mental illness Sister 64       bipolar disorder    Review of Systems  Constitutional: Negative for chills and fever.  Respiratory: Negative for cough and shortness of breath.   Cardiovascular: Negative for chest  pain, palpitations and leg swelling.  Gastrointestinal: Negative for abdominal pain, nausea and vomiting.     OBJECTIVE:  Blood pressure (!) 170/85, pulse 86, temperature 97.7 F (36.5 C), temperature source Oral, height 5' 9.29" (1.76 m), weight 222 lb (100.7 kg), SpO2 97 %. Body mass index is 32.51 kg/m.   Repeat BP unchanged  Wt Readings from Last 3 Encounters:  01/02/18 222 lb (100.7 kg)  03/21/17 219 lb (99.3 kg)  11/17/16 213 lb (96.6 kg)    BP Readings from Last 3 Encounters:  01/02/18 (!) 170/85  03/21/17 140/90  11/20/16 (!) 157/105    Physical Exam  Constitutional: She is oriented to person, place, and time. She appears well-developed and well-nourished.  HENT:  Head: Normocephalic and atraumatic.  Mouth/Throat: Oropharynx is clear and moist. No oropharyngeal exudate.  Eyes: Pupils are equal, round, and reactive to light. Conjunctivae and EOM are normal. No scleral icterus.  Neck: Neck  supple.  Cardiovascular: Normal rate, regular rhythm and normal heart sounds. Exam reveals no gallop and no friction rub.  No murmur heard. Pulmonary/Chest: Effort normal and breath sounds normal. She has no wheezes. She has no rales.  Musculoskeletal: She exhibits no edema.  Neurological: She is alert and oriented to person, place, and time.  Skin: Skin is warm and dry.  Psychiatric: She has a normal mood and affect.  Nursing note and vitals reviewed.   ASSESSMENT and PLAN  1. Essential hypertension, benign Uncontrolled. Discussed treatment options, starting amlodipine 5mg  once a day. R/se/b reviewed. Goal < 140/90, monitor at home. Communicate before 3 months if not at goal. - CBC; Future - TSH; Future - Care order/instruction:  2. Pure hypercholesterolemia Declines statin. Work on [employer] - Comprehensive metabolic panel; Future - Lipid panel; Future  3. Need for prophylactic vaccination and inoculation against influenza - Flu vaccine HIGH DOSE PF (Fluzone High  dose)  Other orders - amLODipine (NORVASC) 5 MG tablet; Take 1 tablet (5 mg total) by mouth daily.    Return in about 3 months (around 04/03/2018) for CPE/medicare.    Rutherford Guys, MD Primary Care at [place], [place] [address] Ph.  [phone] Fax [phone]

## 2018-01-02 NOTE — Patient Instructions (Addendum)
   If you have lab work done today you will be contacted with your lab results within the next 2 weeks.  If you have not heard from us then please contact us. The fastest way to get your results is to register for My Chart.   IF you received an x-ray today, you will receive an invoice from [facility]. Please contact [facility] at [phone] with questions or concerns regarding your invoice.   IF you received labwork today, you will receive an invoice from [facility]. Please contact [facility] at [phone] with questions or concerns regarding your invoice.   Our billing staff will not be able to assist you with questions regarding bills from these companies.  You will be contacted with the lab results as soon as they are available. The fastest way to get your results is to activate your My Chart account. Instructions are located on the last page of this paperwork. If you have not heard from us regarding the results in 2 weeks, please contact this office.      Hypertension Hypertension is another name for high blood pressure. High blood pressure forces your heart to work harder to pump blood. This can cause problems over time. There are two numbers in a blood pressure reading. There is a top number (systolic) over a bottom number (diastolic). It is best to have a blood pressure below 120/80. Healthy choices can help lower your blood pressure. You may need medicine to help lower your blood pressure if:  Your blood pressure cannot be lowered with healthy choices.  Your blood pressure is higher than 130/80.  Follow these instructions at home: Eating and drinking  If directed, follow the DASH eating plan. This diet includes: ? Filling half of your plate at each meal with fruits and vegetables. ? Filling one quarter of your plate at each meal with whole grains. Whole grains include whole wheat pasta, brown rice, and whole grain bread. ? Eating or drinking low-fat dairy  products, such as skim milk or low-fat yogurt. ? Filling one quarter of your plate at each meal with low-fat (lean) proteins. Low-fat proteins include fish, skinless chicken, eggs, beans, and tofu. ? Avoiding fatty meat, cured and processed meat, or chicken with skin. ? Avoiding premade or processed food.  Eat less than 1,500 mg of salt (sodium) a day.  Limit alcohol use to no more than 1 drink a day for nonpregnant women and 2 drinks a day for men. One drink equals 12 oz of beer, 5 oz of wine, or 1 oz of hard liquor. Lifestyle  Work with your doctor to stay at a healthy weight or to lose weight. Ask your doctor what the best weight is for you.  Get at least 30 minutes of exercise that causes your heart to beat faster (aerobic exercise) most days of the week. This may include walking, swimming, or biking.  Get at least 30 minutes of exercise that strengthens your muscles (resistance exercise) at least 3 days a week. This may include lifting weights or pilates.  Do not use any products that contain nicotine or tobacco. This includes cigarettes and e-cigarettes. If you need help quitting, ask your doctor.  Check your blood pressure at home as told by your doctor.  Keep all follow-up visits as told by your doctor. This is important. Medicines  Take over-the-counter and prescription medicines only as told by your doctor. Follow directions carefully.  Do not skip doses of blood pressure medicine. The medicine   does not work as well if you skip doses. Skipping doses also puts you at risk for problems.  Ask your doctor about side effects or reactions to medicines that you should watch for. Contact a doctor if:  You think you are having a reaction to the medicine you are taking.  You have headaches that keep coming back (recurring).  You feel dizzy.  You have swelling in your ankles.  You have trouble with your vision. Get help right away if:  You get a very bad headache.  You  start to feel confused.  You feel weak or numb.  You feel faint.  You get very bad pain in your: ? Chest. ? Belly (abdomen).  You throw up (vomit) more than once.  You have trouble breathing. Summary  Hypertension is another name for high blood pressure.  Making healthy choices can help lower blood pressure. If your blood pressure cannot be controlled with healthy choices, you may need to take medicine. This information is not intended to replace advice given to you by your health care provider. Make sure you discuss any questions you have with your health care provider. Document Released: 09/13/2007 Document Revised: 02/23/2016 Document Reviewed: 02/23/2016 Elsevier Interactive Patient Education  2018 Elsevier Inc.  

## 2018-01-03 DIAGNOSIS — C678 Malignant neoplasm of overlapping sites of bladder: Secondary | ICD-10-CM | POA: Diagnosis not present

## 2018-03-19 ENCOUNTER — Ambulatory Visit (INDEPENDENT_AMBULATORY_CARE_PROVIDER_SITE_OTHER): Payer: Medicare Other | Admitting: Family Medicine

## 2018-03-19 DIAGNOSIS — I10 Essential (primary) hypertension: Secondary | ICD-10-CM | POA: Diagnosis not present

## 2018-03-19 DIAGNOSIS — E7800 Pure hypercholesterolemia, unspecified: Secondary | ICD-10-CM | POA: Diagnosis not present

## 2018-03-19 NOTE — Progress Notes (Signed)
Lab visit only. 

## 2018-03-20 LAB — TSH: TSH: 1.74 u[IU]/mL (ref 0.450–4.500)

## 2018-03-20 LAB — COMPREHENSIVE METABOLIC PANEL
ALT: 31 IU/L (ref 0–32)
AST: 24 IU/L (ref 0–40)
Albumin/Globulin Ratio: 1.9 (ref 1.2–2.2)
Albumin: 4.3 g/dL (ref 3.5–4.8)
Alkaline Phosphatase: 87 IU/L (ref 39–117)
BUN/Creatinine Ratio: 21 (ref 12–28)
BUN: 16 mg/dL (ref 8–27)
Bilirubin Total: 0.5 mg/dL (ref 0.0–1.2)
CO2: 22 mmol/L (ref 20–29)
Calcium: 9.6 mg/dL (ref 8.7–10.3)
Chloride: 104 mmol/L (ref 96–106)
Creatinine, Ser: 0.75 mg/dL (ref 0.57–1.00)
GFR calc Af Amer: 93 mL/min/{1.73_m2} (ref >59)
GFR calc non Af Amer: 81 mL/min/{1.73_m2} (ref >59)
Globulin, Total: 2.3 g/dL (ref 1.5–4.5)
Glucose: 96 mg/dL (ref 65–99)
Potassium: 4.8 mmol/L (ref 3.5–5.2)
Sodium: 142 mmol/L (ref 134–144)
Total Protein: 6.6 g/dL (ref 6.0–8.5)

## 2018-03-20 LAB — LIPID PANEL
Chol/HDL Ratio: 5.3 ratio — ABNORMAL HIGH (ref 0.0–4.4)
Cholesterol, Total: 248 mg/dL — ABNORMAL HIGH (ref 100–199)
HDL: 47 mg/dL (ref >39)
LDL Calculated: 164 mg/dL — ABNORMAL HIGH (ref 0–99)
Triglycerides: 183 mg/dL — ABNORMAL HIGH (ref 0–149)
VLDL Cholesterol Cal: 37 mg/dL (ref 5–40)

## 2018-03-20 LAB — CBC
Hematocrit: 39.2 % (ref 34.0–46.6)
Hemoglobin: 13.4 g/dL (ref 11.1–15.9)
MCH: 31.1 pg (ref 26.6–33.0)
MCHC: 34.2 g/dL (ref 31.5–35.7)
MCV: 91 fL (ref 79–97)
Platelets: 313 10*3/uL (ref 150–450)
RBC: 4.31 x10E6/uL (ref 3.77–5.28)
RDW: 12.6 % (ref 12.3–15.4)
WBC: 4.9 10*3/uL (ref 3.4–10.8)

## 2018-03-26 ENCOUNTER — Other Ambulatory Visit: Payer: Self-pay

## 2018-03-26 ENCOUNTER — Encounter: Payer: Self-pay | Admitting: Family Medicine

## 2018-03-26 ENCOUNTER — Ambulatory Visit (INDEPENDENT_AMBULATORY_CARE_PROVIDER_SITE_OTHER): Payer: Medicare Other | Admitting: Family Medicine

## 2018-03-26 ENCOUNTER — Telehealth: Payer: Self-pay

## 2018-03-26 VITALS — BP 173/110 | HR 88 | Temp 98.5°F | Ht 69.29 in | Wt 223.0 lb

## 2018-03-26 DIAGNOSIS — Z0000 Encounter for general adult medical examination without abnormal findings: Secondary | ICD-10-CM | POA: Diagnosis not present

## 2018-03-26 DIAGNOSIS — I10 Essential (primary) hypertension: Secondary | ICD-10-CM

## 2018-03-26 DIAGNOSIS — E7800 Pure hypercholesterolemia, unspecified: Secondary | ICD-10-CM

## 2018-03-26 MED ORDER — TRIAMTERENE-HCTZ 37.5-25 MG PO TABS: 1.0000 | ORAL_TABLET | Freq: Every day | ORAL | 0 refills | Status: DC

## 2018-03-26 MED ORDER — AMLODIPINE BESYLATE 10 MG PO TABS: 10.0000 mg | ORAL_TABLET | Freq: Every day | ORAL | 1 refills | Status: DC

## 2018-03-26 NOTE — Progress Notes (Signed)
Presents today for [employer] Visit-Subsequent.   Date of last exam: 03/21/2017  Interpreter used for this visit? no  Patient Care Team: Rutherford Guys, MD as PCP - General (Family Medicine) Wonda Horner, MD as Consulting Physician (Gastroenterology)   Other items to address today: BP Started amlodipine 5mg  at last [place] well Her father died in mid october  Cancer Screening: Cervical: n/a Breast: yes, mammogram July 2019 Colon: yes, has been contacted by [facility] to schedule Prostate: n/a   Other Screening: Last screening for diabetes: see below Last lipid screening: see below  Lab Results  Component Value Date   HGBA1C 5.6 03/21/2017   Lab Results  Component Value Date   LDLCALC 164 (H) 03/19/2018   CREATININE 0.75 03/19/2018   She wants to start [facility] program Needs medical clearance forms completed supervized exercise program She does water aerobic about 4 days a week, walks 3 miles once a week  ADVANCE DIRECTIVES: Discussed: yes Patient desires CPR (Yes ), mechanical ventilation (Yes ), prolonged artificial support (may include mechanical ventilation, tube/PEG feeding, etc) (No ). On File: no, she will bring Materials Provided: no  Immunization status:  Immunization History  Administered Date(s) Administered  . Influenza, High Dose Seasonal PF 01/02/2018  . Influenza,inj,Quad PF,6+ Mos 03/02/2014, 12/25/2014, 12/28/2015, 03/21/2017  . Pneumococcal Conjugate-13 03/02/2014  . Pneumococcal Polysaccharide-23 12/25/2014     There are no preventive care reminders to display for this patient.   Functional Status Survey: Is the patient deaf or have difficulty hearing?: No Does the patient have difficulty seeing, even when wearing glasses/contacts?: Yes(pickcing up new contacts soon) Does the patient have difficulty concentrating, remembering, or making decisions?: No Does the patient have difficulty walking or climbing  stairs?: No Does the patient have difficulty dressing or bathing?: No Does the patient have difficulty doing errands alone such as visiting a doctor's office or shopping?: No  6CIT Screen 03/26/2018  What Year? 0 points  What month? 0 points  What time? 0 points  Count back from 20 0 points  Months in reverse 0 points  Repeat phrase 0 points  Total Score 0     Office Visit from 03/26/2018 in Primary Care at [facility]  AUDIT-C Score  2      Patient Active Problem List   Diagnosis Date Noted  . Class 1 obesity due to excess calories with serious comorbidity and body mass index (BMI) of 32.0 to 32.9 in adult 03/27/2017  . Family history of breast cancer in sister 03/21/2017  . Family history of melanoma 03/21/2017  . Pure hypercholesterolemia 12/25/2014  . Bladder cancer ([place]) 09/29/2013     Past Medical History:  Diagnosis Date  . Bladder cancer ([place]) [place]  . Duplex kidney    RIGHT  . History of adenomatous polyp of colon   . History of recurrent UTIs   . Hyperlipidemia   . Renal cyst, left    NON-COMPLEX  . Wears contact lenses      Past Surgical History:  Procedure Laterality Date  . BENIGN BREAST BX Right 1995  . BUNIONECTOMY Right 2000  . COLONOSCOPY W/ POLYPECTOMY  01/06/2013   single sessile polyp ascending colon.  [facility].  Marland Kitchen CYSTOSCOPY W/ RETROGRADES Bilateral 12/10/2013   Procedure: RETROGRADE PYELOGRAM  ;  Surgeon: Alexis Frock, MD;  Location: [facility];  Service: Urology;  Laterality: Bilateral;  . CYSTOSCOPY W/ RETROGRADES Bilateral 07/15/2014  Procedure: CYSTOSCOPY WITH RETROGRADE PYELOGRAM AND INSTILLATION OF MITOMYCIN C;  Surgeon: Alexis Frock, MD;  Location: [facility];  Service: Urology;  Laterality: Bilateral;  . CYSTOSCOPY W/ RETROGRADES Bilateral 12/31/2015   Procedure: CYSTOSCOPY WITH RETROGRADE PYELOGRAM;  Surgeon: Alexis Frock, MD;  Location: [facility];  Service:  Urology;  Laterality: Bilateral;  . CYSTOSCOPY W/ RETROGRADES Bilateral 11/17/2016   Procedure: CYSTOSCOPY WITH RETROGRADE PYELOGRAM;  Surgeon: Alexis Frock, MD;  Location: [facility];  Service: Urology;  Laterality: Bilateral;  . CYSTOSCOPY W/ URETERAL STENT PLACEMENT Bilateral 10/22/2013   Procedure: CYSTOSCOPY WITH RETROGRADE PYELOGRAM;  Surgeon: Alexis Frock, MD;  Location: [facility];  Service: Urology;  Laterality: Bilateral;  . CYSTOSCOPY WITH RETROGRADE PYELOGRAM, URETEROSCOPY AND STENT PLACEMENT Bilateral 04/14/2015   Procedure: CYSTOSCOPY WITH RETROGRADE PYELOGRAM, URETEROSCOPY AND RIGHT STENT PLACEMENT;  Surgeon: Alexis Frock, MD;  Location[facility];  Service: Urology;  Laterality: Bilateral;  . TONSILLECTOMY  66   (age 3)  . TRANSURETHRAL RESECTION OF BLADDER TUMOR N/A 12/31/2015   Procedure: TRANSURETHRAL RESECTION OF BLADDER TUMOR (TURBT) WITH GYRUS;  Surgeon: Alexis Frock, MD;  Location: [facility];  Service: Urology;  Laterality: N/A;  . TRANSURETHRAL RESECTION OF BLADDER TUMOR N/A 11/17/2016   Procedure: TRANSURETHRAL RESECTION OF BLADDER TUMOR (TURBT);  Surgeon: Alexis Frock, MD;  Location: [facility];  Service: Urology;  Laterality: N/A;  . TRANSURETHRAL RESECTION OF BLADDER TUMOR WITH GYRUS (TURBT-GYRUS) N/A 10/22/2013   Procedure: TRANSURETHRAL RESECTION OF BLADDER TUMOR WITH ERBE.;  Surgeon: Alexis Frock, MD;  Location: [facility];  Service: Urology;  Laterality: N/A;  . TRANSURETHRAL RESECTION OF BLADDER TUMOR WITH GYRUS (TURBT-GYRUS) N/A 12/10/2013   Procedure: RESTAGING TRANSURETHRAL RESECTION OF BLADDER TUMOR WITH GYRUS (TURBT-GYRUS);  Surgeon: Alexis Frock, MD;  Location: [facility];  Service: Urology;  Laterality: N/A;  . TRANSURETHRAL RESECTION OF BLADDER TUMOR WITH GYRUS (TURBT-GYRUS) N/A 07/15/2014   Procedure: TRANSURETHRAL RESECTION OF BLADDER  TUMOR WITH GYRUS (TURBT-GYRUS);  Surgeon: Alexis Frock, MD;  Location: [facility];  Service: Urology;  Laterality: N/A;  . TRANSURETHRAL RESECTION OF BLADDER TUMOR WITH GYRUS (TURBT-GYRUS) N/A 04/14/2015   Procedure: TRANSURETHRAL RESECTION OF BLADDER TUMOR WITH GYRUS (TURBT-GYRUS);  Surgeon: Alexis Frock, MD;  Location: [facility];  Service: Urology;  Laterality: N/A;     Family History  Problem Relation Age of Onset  . Hypertension Mother   . Arthritis Mother        knee replacement; shoulder replacement  . Heart disease Mother        AAA; mild AMI in 2018  . Stroke Mother 44       TIA  . Cancer Father        prostate cancer  . Cancer Brother        prostate cancer  . Cancer Brother 50       Melanoma  . Mental illness Sister 99       bipolar disorder     Social History   Socioeconomic History  . Marital status: Divorced    Spouse name: Not on file  . Number of children: 3  . Years of education: Not on file  . Highest education level: Not on file  Occupational History  . 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  Social Needs  . Financial resource strain: Not on file  . Food insecurity:  Worry: Not on file    Inability: Not on file  . Transportation needs:    Medical: Not on file    Non-medical: Not on file  Tobacco Use  . Smoking status: Former Smoker    Packs/day: 0.15    Years: 20.00    Pack years: 3.00    Types: Cigarettes    Last attempt to quit: 10/17/2012    Years since quitting: 5.4  . Smokeless tobacco: Never Used  Substance and Sexual Activity  . Alcohol use: Yes    Comment: OCCASIONAL  . Drug use: No  . Sexual activity: Not Currently    Birth control/protection: Post-menopausal  Lifestyle  . Physical activity:    Days per week: Not on file    Minutes per session: Not on file  . Stress: Not on file  Relationships  . Social connections:    Talks on phone:  Not on file    Gets together: Not on file    Attends religious service: Not on file    Active member of club or organization: Not on file    Attends meetings of clubs or organizations: Not on file    Relationship status: Not on file  . Intimate partner violence:    Fear of current or ex partner: Not on file    Emotionally abused: Not on file    Physically abused: Not on file    Forced sexual activity: Not on file  Other Topics Concern  . Not on file  Social History Narrative   Marital status:  Divorced; not dating in 2018.      Children:  3 children; 2 grandchildren (5,4); family within walking distance.        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      Tobacco:  None; quit in 09/2012.      Alcohol: 1 drink per week.       Drugs:  None      Exercise:  One hour of water aerobics 3-4 days per week.      Seatbelt: 100%      Guns:  none      ADLs: independent with all ADLs; drives; no assistant device.      Advanced Directives:  Yes; FULL CODE; no prolonged measures.     Allergies  Allergen Reactions  . Hycodan [Hydrocodone-Homatropine] Hives  . Other     Unknown cough syrup     Prior to Admission medications   Medication Sig Start Date End Date Taking? Authorizing Provider  amLODipine (NORVASC) 5 MG tablet Take 1 tablet (5 mg total) by mouth daily. 01/02/18  Yes Rutherford Guys, MD     Depression screen [facility] 2/9 03/26/2018 01/02/2018 03/21/2017 07/05/2016 12/28/2015  Decreased Interest 0 0 0 0 0  Down, Depressed, Hopeless 0 0 0 0 0  PHQ - 2 Score 0 0 0 0 0     Fall Risk  03/26/2018 01/02/2018 03/21/2017 07/05/2016 12/28/2015  Falls in the past year? 0 No No No No    Review of Systems  Constitutional: Negative for chills and fever.  Respiratory: Negative for cough and shortness of breath.   Cardiovascular: Negative for chest pain, palpitations and leg swelling.  Gastrointestinal: Negative for abdominal pain,  nausea and vomiting.    PHYSICAL EXAM: BP (!) 173/110 (BP Location: Left Arm, Patient Position: Sitting, Cuff Size: Large)   Pulse 88   Temp 98.5 F (36.9  C) (Oral)   Ht 5' 9.29" (1.76 m)   Wt 223 lb (101.2 kg)   SpO2 96%   BMI 32.66 kg/m    Wt Readings from Last 3 Encounters:  03/26/18 223 lb (101.2 kg)  01/02/18 222 lb (100.7 kg)  03/21/17 219 lb (99.3 kg)   BP Readings from Last 3 Encounters:  03/26/18 (!) 173/110  01/02/18 (!) 170/85  03/21/17 140/90     Visual Acuity Screening   Right eye Left eye Both eyes  Without correction:     With correction: 20/50 20/50 20/20      Education/Counseling provided regarding diet and exercise, prevention of chronic diseases, smoking/tobacco cessation, if applicable, and reviewed "Covered Medicare Preventive Services."   ASSESSMENT/PLAN:  1. Encounter for Medicare annual wellness exam HCM reviewed/discussed. Anticipatory guidance regarding healthy weight, lifestyle and choices given. Forms for hope program completed  2. Essential hypertension, benign Uncontrolled, adding hctz, increasing amlodipine ER precautions reviewed  3. Pure hypercholesterolemia Discussed LFM.  Other orders - amLODipine (NORVASC) 10 MG tablet; Take 1 tablet (10 mg total) by mouth daily. - triamterene-hydrochlorothiazide (MAXZIDE-25) 37.5-25 MG tablet; Take 1 tablet by mouth daily.  Return in about 2 weeks (around 04/09/2018).

## 2018-03-26 NOTE — Patient Instructions (Addendum)
  I recommend a healthier diet, regular exercise and healthy weight. Diet low in fried, fatty, processed foods and red meat  Diet high in vegetables, fruits, poultry, salmon, nuts (almonds, peanuts, walnuts), seeds (sunflower, pumpkin and sesame), fiber, olive oil, flax seed, oat bran and brown rice.    If you have lab work done today you will be contacted with your lab results within the next 2 weeks.  If you have not heard from [place] then please contact us. The fastest way to get your results is to register for My Chart.   IF you received an x-ray today, you will receive an invoice from [facility]. Please contact [facility] at [phone] with questions or concerns regarding your invoice.   IF you received labwork today, you will receive an invoice from [place]. Please contact [facility] at [phone] with questions or concerns regarding your invoice.   Our billing staff will not be able to assist you with questions regarding bills from these companies.  You will be contacted with the lab results as soon as they are available. The fastest way to get your results is to activate your My Chart account. Instructions are located on the last page of this paperwork. If you have not heard from [place] regarding the results in 2 weeks, please contact this office.

## 2018-03-26 NOTE — Telephone Encounter (Signed)
Called pt to inform her that forms will be located at the front desk

## 2018-04-09 ENCOUNTER — Telehealth: Payer: Self-pay | Admitting: Family Medicine

## 2018-04-09 ENCOUNTER — Ambulatory Visit: Payer: Medicare Other | Admitting: Family Medicine

## 2018-04-09 NOTE — Telephone Encounter (Signed)
Called and spoke with pt regarding appt today with Dr. Pamella Pert. Sr. Pamella Pert will be out of the offie today due to illness. We will be calling pt back to reschedule.m Pt states that that will be fine.

## 2018-04-09 NOTE — Telephone Encounter (Signed)
Called and spoke with pt again. I was able to get her rescheduled to Saturday 04-13-18 at 9:40. I advised of time, building number and late policy. Pt acknowledged.

## 2018-04-13 ENCOUNTER — Encounter: Payer: Self-pay | Admitting: Family Medicine

## 2018-04-13 ENCOUNTER — Ambulatory Visit: Payer: Medicare Other | Admitting: Family Medicine

## 2018-04-13 ENCOUNTER — Other Ambulatory Visit: Payer: Self-pay

## 2018-04-13 VITALS — BP 133/83 | HR 85 | Temp 98.6°F | Resp 18 | Ht 69.29 in | Wt 219.6 lb

## 2018-04-13 DIAGNOSIS — R0982 Postnasal drip: Secondary | ICD-10-CM | POA: Diagnosis not present

## 2018-04-13 DIAGNOSIS — I10 Essential (primary) hypertension: Secondary | ICD-10-CM

## 2018-04-13 DIAGNOSIS — E7800 Pure hypercholesterolemia, unspecified: Secondary | ICD-10-CM

## 2018-04-13 DIAGNOSIS — R05 Cough: Secondary | ICD-10-CM

## 2018-04-13 DIAGNOSIS — R059 Cough, unspecified: Secondary | ICD-10-CM

## 2018-04-13 MED ORDER — TRIAMTERENE-HCTZ 37.5-25 MG PO TABS: 1.0000 | ORAL_TABLET | Freq: Every day | ORAL | 1 refills | Status: DC

## 2018-04-13 MED ORDER — FLUTICASONE PROPIONATE 50 MCG/ACT NA SUSP: 1.0000 | Freq: Two times a day (BID) | NASAL | 6 refills | Status: DC

## 2018-04-13 MED ORDER — BENZONATATE 100 MG PO CAPS: 100.0000 mg | ORAL_CAPSULE | Freq: Three times a day (TID) | ORAL | 0 refills | Status: DC | PRN

## 2018-04-13 MED ORDER — AMLODIPINE BESYLATE 10 MG PO TABS: 10.0000 mg | ORAL_TABLET | Freq: Every day | ORAL | 1 refills | Status: DC

## 2018-04-13 NOTE — Patient Instructions (Addendum)
Fasting labs 1 week before our next appt in 6 months    If you have lab work done today you will be contacted with your lab results within the next 2 weeks.  If you have not heard from [place] then please contact us. The fastest way to get your results is to register for My Chart.   IF you received an x-ray today, you will receive an invoice from [facility]. Please contact [facility] at [phone] with questions or concerns regarding your invoice.   IF you received labwork today, you will receive an invoice from [place]. Please contact [facility] at [phone] with questions or concerns regarding your invoice.   Our billing staff will not be able to assist you with questions regarding bills from these companies.  You will be contacted with the lab results as soon as they are available. The fastest way to get your results is to activate your My Chart account. Instructions are located on the last page of this paperwork. If you have not heard from [place] regarding the results in 2 weeks, please contact this office.     Postnasal Drip Postnasal drip is the feeling of mucus going down the back of your throat. Mucus is a slimy substance that moistens and cleans your nose and throat, as well as the air pockets in face bones near your forehead and cheeks (sinuses). Small amounts of mucus pass from your nose and sinuses down the back of your throat all the time. This is normal. When you produce too much mucus or the mucus gets too thick, you can feel it. Some common causes of postnasal drip include:  Having more mucus because of: ? A cold or the flu. ? Allergies. ? Cold air. ? Certain medicines.  Having more mucus that is thicker because of: ? A sinus or nasal infection. ? Dry air. ? A food allergy. Follow these instructions at home: Relieving discomfort   Gargle with a salt-water mixture 3-4 times a day or as needed. To make a salt-water mixture, completely dissolve -1 tsp  of salt in 1 cup of warm water.  If the air in your home is dry, use a humidifier to add moisture to the air.  Use a saline spray or container (neti pot) to flush out the nose (nasal irrigation). These methods can help clear away mucus and keep the nasal passages moist. General instructions  Take over-the-counter and prescription medicines only as told by your health care provider.  Follow instructions from your health care provider about eating or drinking restrictions. You may need to avoid caffeine.  Avoid things that you know you are allergic to (allergens), like dust, mold, pollen, pets, or certain foods.  Drink enough fluid to keep your urine pale yellow.  Keep all follow-up visits as told by your health care provider. This is important. Contact a health care provider if:  You have a fever.  You have a sore throat.  You have difficulty swallowing.  You have headache.  You have sinus pain.  You have a cough that does not go away.  The mucus from your nose becomes thick and is green or yellow in color.  You have cold or flu symptoms that last more than 10 days. Summary  Postnasal drip is the feeling of mucus going down the back of your throat.  If your health care provider approves, use nasal irrigation or a nasal spray 2?4 times a day.  Avoid things that you know you are  allergic to (allergens), like dust, mold, pollen, pets, or certain foods. This information is not intended to replace advice given to you by your health care provider. Make sure you discuss any questions you have with your health care provider. Document Released: 07/10/2016 Document Revised: 07/10/2016 Document Reviewed: 07/10/2016 Elsevier Interactive Patient Education  2019 [employer].

## 2018-04-13 NOTE — Progress Notes (Signed)
1/4/202010:42 AM  [name] 1945-10-13, 73 y.o. female [id number]  Chief Complaint  Patient presents with  . Hypertension    follow up   . Cough    x64month having a cold symptom issuse     HPI:   Patient is a 73 y.o. female with past medical history significant for HTN, HLP who presents today for routine follouwp  Having a cough for about a month No fever or achyness Productive cough, lots of PND Having sinus pressure Has been doing nasal saline No SOB Cough worse at night Ears popping No sore throat Tried afrin for a couple of days, took sudafed without benefit Has decreased appetite  Checking BP at home Tolerating meds well  Will be starting [facility] exercise program next week  BP Readings from Last 3 Encounters:  04/13/18 133/83  03/26/18 (!) 173/110  01/02/18 (!) 170/85   Lab Results  Component Value Date   CHOL 248 (H) 03/19/2018   HDL 47 03/19/2018   LDLCALC 164 (H) 03/19/2018   TRIG 183 (H) 03/19/2018   CHOLHDL 5.3 (H) 03/19/2018   Wt Readings from Last 3 Encounters:  04/13/18 219 lb 9.6 oz (99.6 kg)  03/26/18 223 lb (101.2 kg)  01/02/18 222 lb (100.7 kg)    Fall Risk  04/13/2018 03/26/2018 01/02/2018 03/21/2017 07/05/2016  Falls in the past year? 0 0 No No No     Depression screen [facility] 2/9 04/13/2018 03/26/2018 01/02/2018  Decreased Interest 0 0 0  Down, Depressed, Hopeless 0 0 0  PHQ - 2 Score 0 0 0    Allergies  Allergen Reactions  . Hycodan [Hydrocodone-Homatropine] Hives  . Other     Unknown cough syrup    Prior to Admission medications   Medication Sig Start Date End Date Taking? Authorizing Provider  amLODipine (NORVASC) 10 MG tablet Take 1 tablet (10 mg total) by mouth daily. 03/26/18  Yes Rutherford Guys, MD  triamterene-hydrochlorothiazide (MAXZIDE-25) 37.5-25 MG tablet Take 1 tablet by mouth daily. 03/26/18  Yes Rutherford Guys, MD    Past Medical History:  Diagnosis Date  . Bladder cancer ([place]) [place]  . Duplex kidney    RIGHT  . History of adenomatous polyp of colon   . History of recurrent UTIs   . Hyperlipidemia   . Renal cyst, left    NON-COMPLEX  . Wears contact lenses     Past Surgical History:  Procedure Laterality Date  . BENIGN BREAST BX Right 1995  . BUNIONECTOMY Right 2000  . COLONOSCOPY W/ POLYPECTOMY  01/06/2013   single sessile polyp ascending colon.  [facility].  Marland Kitchen CYSTOSCOPY W/ RETROGRADES Bilateral 12/10/2013   Procedure: RETROGRADE PYELOGRAM  ;  Surgeon: Alexis Frock, MD;  Location: [facility];  Service: Urology;  Laterality: Bilateral;  . CYSTOSCOPY W/ RETROGRADES Bilateral 07/15/2014   Procedure: CYSTOSCOPY WITH RETROGRADE PYELOGRAM AND INSTILLATION OF MITOMYCIN C;  Surgeon: Alexis Frock, MD;  Location: [facility];  Service: Urology;  Laterality: Bilateral;  . CYSTOSCOPY W/ RETROGRADES Bilateral 12/31/2015   Procedure: CYSTOSCOPY WITH RETROGRADE PYELOGRAM;  Surgeon: Alexis Frock, MD;  Location: [facility];  Service: Urology;  Laterality: Bilateral;  . CYSTOSCOPY W/ RETROGRADES Bilateral 11/17/2016   Procedure: CYSTOSCOPY WITH RETROGRADE PYELOGRAM;  Surgeon: Alexis Frock, MD;  Location: [facility];  Service: Urology;  Laterality: Bilateral;  . CYSTOSCOPY W/ URETERAL STENT PLACEMENT Bilateral 10/22/2013   Procedure: CYSTOSCOPY WITH  RETROGRADE PYELOGRAM;  Surgeon: Alexis Frock, MD;  Location: [facility];  Service: Urology;  Laterality: Bilateral;  . CYSTOSCOPY WITH RETROGRADE PYELOGRAM, URETEROSCOPY AND STENT PLACEMENT Bilateral 04/14/2015   Procedure: CYSTOSCOPY WITH RETROGRADE PYELOGRAM, URETEROSCOPY AND RIGHT STENT PLACEMENT;  Surgeon: Alexis Frock, MD;  Location: [facility];  Service: Urology;  Laterality: Bilateral;  . TONSILLECTOMY  58   (age 83)  . TRANSURETHRAL RESECTION OF BLADDER TUMOR N/A 12/31/2015   Procedure: TRANSURETHRAL RESECTION OF BLADDER  TUMOR (TURBT) WITH GYRUS;  Surgeon: Alexis Frock, MD;  Location: [facility];  Service: Urology;  Laterality: N/A;  . TRANSURETHRAL RESECTION OF BLADDER TUMOR N/A 11/17/2016   Procedure: TRANSURETHRAL RESECTION OF BLADDER TUMOR (TURBT);  Surgeon: Alexis Frock, MD;  Location: [facility];  Service: Urology;  Laterality: N/A;  . TRANSURETHRAL RESECTION OF BLADDER TUMOR WITH GYRUS (TURBT-GYRUS) N/A 10/22/2013   Procedure: TRANSURETHRAL RESECTION OF BLADDER TUMOR WITH ERBE.;  Surgeon: Alexis Frock, MD;  Location: [facility];  Service: Urology;  Laterality: N/A;  . TRANSURETHRAL RESECTION OF BLADDER TUMOR WITH GYRUS (TURBT-GYRUS) N/A 12/10/2013   Procedure: RESTAGING TRANSURETHRAL RESECTION OF BLADDER TUMOR WITH GYRUS (TURBT-GYRUS);  Surgeon: Alexis Frock, MD;  Location: [facility];  Service: Urology;  Laterality: N/A;  . TRANSURETHRAL RESECTION OF BLADDER TUMOR WITH GYRUS (TURBT-GYRUS) N/A 07/15/2014   Procedure: TRANSURETHRAL RESECTION OF BLADDER TUMOR WITH GYRUS (TURBT-GYRUS);  Surgeon: Alexis Frock, MD;  Location: [facility];  Service: Urology;  Laterality: N/A;  . TRANSURETHRAL RESECTION OF BLADDER TUMOR WITH GYRUS (TURBT-GYRUS) N/A 04/14/2015   Procedure: TRANSURETHRAL RESECTION OF BLADDER TUMOR WITH GYRUS (TURBT-GYRUS);  Surgeon: Alexis Frock, MD;  Location: [facility];  Service: Urology;  Laterality: N/A;    Social History   Tobacco Use  . Smoking status: Former Smoker    Packs/day: 0.15    Years: 20.00    Pack years: 3.00    Types: Cigarettes    Last attempt to quit: 10/17/2012    Years since quitting: 5.4  . Smokeless tobacco: Never Used  Substance Use Topics  . Alcohol use: Yes    Comment: OCCASIONAL    Family History  Problem Relation Age of Onset  . Hypertension Mother   . Arthritis Mother        knee replacement; shoulder replacement  . Heart disease Mother        AAA; mild  AMI in 2018  . Stroke Mother 96       TIA  . Cancer Father        prostate cancer  . Cancer Brother        prostate cancer  . Cancer Brother 50       Melanoma  . Mental illness Sister 103       bipolar disorder    ROS Per hpi  OBJECTIVE:  Blood pressure 133/83, pulse 85, temperature 98.6 F (37 C), temperature source Oral, resp. rate 18, height 5' 9.29" (1.76 m), weight 219 lb 9.6 oz (99.6 kg), SpO2 96 %. Body mass index is 32.16 kg/m.   Physical Exam Vitals signs and nursing note reviewed.  Constitutional:      Appearance: She is well-developed.  HENT:     Head: Normocephalic and atraumatic.     Right Ear: Hearing, tympanic membrane, ear canal and external ear normal.     Left Ear: Hearing, tympanic membrane, ear canal and external ear normal.     Nose:  Right Sinus: No maxillary sinus tenderness or frontal sinus tenderness.     Left Sinus: No maxillary sinus tenderness or frontal sinus tenderness.     Mouth/Throat:     Pharynx: Posterior oropharyngeal erythema present. No pharyngeal swelling or oropharyngeal exudate.  Eyes:     Conjunctiva/sclera: Conjunctivae normal.     Pupils: Pupils are equal, round, and reactive to light.  Neck:     Musculoskeletal: Neck supple.  Cardiovascular:     Rate and Rhythm: Normal rate and regular rhythm.     Heart sounds: Normal heart sounds. No murmur. No friction rub. No gallop.   Pulmonary:     Effort: Pulmonary effort is normal.     Breath sounds: Normal breath sounds. No wheezing or rales.  Lymphadenopathy:     Cervical: No cervical adenopathy.  Skin:    General: Skin is warm and dry.  Neurological:     Mental Status: She is alert and oriented to person, place, and time.     ASSESSMENT and PLAN  1. Cough 2. Post-nasal drip Discussed supportive measures, new meds r/se/b and RTC precautions. Patient educational handout given.  3. Essential hypertension, benign Controlled. Continue current regime.   4. Pure  hypercholesterolemia Uncontrolled. Working on [employer]. Recheck in 6 months - Comprehensive metabolic panel; Future - Lipid panel; Future  Other orders - fluticasone (FLONASE) 50 MCG/ACT nasal spray; Place 1 spray into both nostrils 2 (two) times daily. - benzonatate (TESSALON) 100 MG capsule; Take 1-2 capsules (100-200 mg total) by mouth 3 (three) times daily as needed for cough. - amLODipine (NORVASC) 10 MG tablet; Take 1 tablet (10 mg total) by mouth daily. - triamterene-hydrochlorothiazide (MAXZIDE-25) 37.5-25 MG tablet; Take 1 tablet by mouth daily.  Return in about 6 months (around 10/12/2018), or if symptoms worsen or fail to improve, for HTN, HLP.    Rutherford Guys, MD Primary Care at [place], [place] [address] Ph.  [phone] Fax [phone]

## 2018-05-17 ENCOUNTER — Ambulatory Visit (INDEPENDENT_AMBULATORY_CARE_PROVIDER_SITE_OTHER): Payer: Medicare Other | Admitting: Family Medicine

## 2018-05-17 ENCOUNTER — Other Ambulatory Visit: Payer: Self-pay

## 2018-05-17 ENCOUNTER — Encounter: Payer: Self-pay | Admitting: Family Medicine

## 2018-05-17 VITALS — BP 139/85 | HR 88 | Temp 98.7°F | Resp 18 | Ht 69.69 in | Wt 216.4 lb

## 2018-05-17 DIAGNOSIS — R6889 Other general symptoms and signs: Secondary | ICD-10-CM | POA: Diagnosis not present

## 2018-05-17 DIAGNOSIS — J22 Unspecified acute lower respiratory infection: Secondary | ICD-10-CM

## 2018-05-17 LAB — POC INFLUENZA A&B (BINAX/QUICKVUE)
Influenza A, POC: NEGATIVE
Influenza B, POC: NEGATIVE

## 2018-05-17 MED ORDER — AZITHROMYCIN 250 MG PO TABS: ORAL_TABLET | ORAL | 0 refills | Status: AC

## 2018-05-17 NOTE — Progress Notes (Signed)
2/7/202012:19 PM  [name] June 13, 1945, 73 y.o. female [id number]  Chief Complaint  Patient presents with  . Cough    f/u  . Fever    X 2 days    HPI:   Patient is a 73 y.o. female with past medical history significant for HTN, HLP who presents today for cough and fever x 2 days  Seen last month for a cough She was getting better but never resolved Then 2 days ago got really bad 100.8, achy, significantly worsening cough, productive No SOB, sore throat, ear pain, nausea, vomiting, diarrhea Decreased appetite, feeling very tired Tolerating fluids ok  Fall Risk  05/17/2018 04/13/2018 03/26/2018 01/02/2018 03/21/2017  Falls in the past year? 0 0 0 No No  Number falls in past yr: 0 - - - -  Injury with Fall? 0 - - - -  Follow up Falls evaluation completed - - - -     Depression screen [facility] 2/9 05/17/2018 04/13/2018 03/26/2018  Decreased Interest 0 0 0  Down, Depressed, Hopeless 0 0 0  PHQ - 2 Score 0 0 0    Allergies  Allergen Reactions  . Hycodan [Hydrocodone-Homatropine] Hives  . Other     Unknown cough syrup    Prior to Admission medications   Medication Sig Start Date End Date Taking? Authorizing Provider  amLODipine (NORVASC) 10 MG tablet Take 1 tablet (10 mg total) by mouth daily. 04/13/18  Yes Rutherford Guys, MD  benzonatate (TESSALON) 100 MG capsule Take 1-2 capsules (100-200 mg total) by mouth 3 (three) times daily as needed for cough. 04/13/18  Yes Rutherford Guys, MD  fluticasone (FLONASE) 50 MCG/ACT nasal spray Place 1 spray into both nostrils 2 (two) times daily. 04/13/18  Yes Rutherford Guys, MD  triamterene-hydrochlorothiazide (MAXZIDE-25) 37.5-25 MG tablet Take 1 tablet by mouth daily. 04/13/18  Yes Rutherford Guys, MD    Past Medical History:  Diagnosis Date  . Bladder cancer ([place]) [place]  . Duplex kidney    RIGHT  . History of adenomatous polyp of colon   . History of recurrent UTIs   . Hyperlipidemia   . Renal  cyst, left    NON-COMPLEX  . Wears contact lenses     Past Surgical History:  Procedure Laterality Date  . BENIGN BREAST BX Right 1995  . BUNIONECTOMY Right 2000  . COLONOSCOPY W/ POLYPECTOMY  01/06/2013   single sessile polyp ascending colon.  [facility].  Marland Kitchen CYSTOSCOPY W/ RETROGRADES Bilateral 12/10/2013   Procedure: RETROGRADE PYELOGRAM  ;  Surgeon: Alexis Frock, MD;  Location: [facility];  Service: Urology;  Laterality: Bilateral;  . CYSTOSCOPY W/ RETROGRADES Bilateral 07/15/2014   Procedure: CYSTOSCOPY WITH RETROGRADE PYELOGRAM AND INSTILLATION OF MITOMYCIN C;  Surgeon: Alexis Frock, MD;  Location: [facility];  Service: Urology;  Laterality: Bilateral;  . CYSTOSCOPY W/ RETROGRADES Bilateral 12/31/2015   Procedure: CYSTOSCOPY WITH RETROGRADE PYELOGRAM;  Surgeon: Alexis Frock, MD;  Location: [facility];  Service: Urology;  Laterality: Bilateral;  . CYSTOSCOPY W/ RETROGRADES Bilateral 11/17/2016   Procedure: CYSTOSCOPY WITH RETROGRADE PYELOGRAM;  Surgeon: Alexis Frock, MD;  [facility];  Service: Urology;  Laterality: Bilateral;  . CYSTOSCOPY W/ URETERAL STENT PLACEMENT Bilateral 10/22/2013   Procedure: CYSTOSCOPY WITH RETROGRADE PYELOGRAM;  Surgeon: Alexis Frock, MD;  Location: [facility];  Service: Urology;  Laterality: Bilateral;  . CYSTOSCOPY WITH RETROGRADE PYELOGRAM, URETEROSCOPY AND  STENT PLACEMENT Bilateral 04/14/2015   Procedure: CYSTOSCOPY WITH RETROGRADE PYELOGRAM, URETEROSCOPY AND RIGHT STENT PLACEMENT;  Surgeon: Alexis Frock, MD;  Location: [facility];  Service: Urology;  Laterality: Bilateral;  . TONSILLECTOMY  43   (age 72)  . TRANSURETHRAL RESECTION OF BLADDER TUMOR N/A 12/31/2015   Procedure: TRANSURETHRAL RESECTION OF BLADDER TUMOR (TURBT) WITH GYRUS;  Surgeon: Alexis Frock, MD;  Location: [facility];  Service: Urology;  Laterality: N/A;  .  TRANSURETHRAL RESECTION OF BLADDER TUMOR N/A 11/17/2016   Procedure: TRANSURETHRAL RESECTION OF BLADDER TUMOR (TURBT);  Surgeon: Alexis Frock, MD;  Location[facility];  Service: Urology;  Laterality: N/A;  . TRANSURETHRAL RESECTION OF BLADDER TUMOR WITH GYRUS (TURBT-GYRUS) N/A 10/22/2013   Procedure: TRANSURETHRAL RESECTION OF BLADDER TUMOR WITH ERBE.;  Surgeon: Alexis Frock, MD;  Location: [facility];  Service: Urology;  Laterality: N/A;  . TRANSURETHRAL RESECTION OF BLADDER TUMOR WITH GYRUS (TURBT-GYRUS) N/A 12/10/2013   Procedure: RESTAGING TRANSURETHRAL RESECTION OF BLADDER TUMOR WITH GYRUS (TURBT-GYRUS);  Surgeon: Alexis Frock, MD;  [facility];  Service: Urology;  Laterality: N/A;  . TRANSURETHRAL RESECTION OF BLADDER TUMOR WITH GYRUS (TURBT-GYRUS) N/A 07/15/2014   Procedure: TRANSURETHRAL RESECTION OF BLADDER TUMOR WITH GYRUS (TURBT-GYRUS);  Surgeon: Alexis Frock, MD;  Location: [facility];  Service: Urology;  Laterality: N/A;  . TRANSURETHRAL RESECTION OF BLADDER TUMOR WITH GYRUS (TURBT-GYRUS) N/A 04/14/2015   Procedure: TRANSURETHRAL RESECTION OF BLADDER TUMOR WITH GYRUS (TURBT-GYRUS);  Surgeon: Alexis Frock, MD;  Location: [facility];  Service: Urology;  Laterality: N/A;    Social History   Tobacco Use  . Smoking status: Former Smoker    Packs/day: 0.15    Years: 20.00    Pack years: 3.00    Types: Cigarettes    Last attempt to quit: 10/17/2012    Years since quitting: 5.5  . Smokeless tobacco: Never Used  Substance Use Topics  . Alcohol use: Yes    Comment: OCCASIONAL    Family History  Problem Relation Age of Onset  . Hypertension Mother   . Arthritis Mother        knee replacement; shoulder replacement  . Heart disease Mother        AAA; mild AMI in 2018  . Stroke Mother 60       TIA  . Cancer Father        prostate cancer  . Cancer Brother        prostate cancer  .  Cancer Brother 50       Melanoma  . Mental illness Sister 58       bipolar disorder    ROS Per hpi  OBJECTIVE:  Blood pressure 139/85, pulse 88, temperature 98.7 F (37.1 C), temperature source Oral, resp. rate 18, height 5' 9.69" (1.77 m), weight 216 lb 6.4 oz (98.2 kg), SpO2 99 %. Body mass index is 31.33 kg/m.   Physical Exam Vitals signs and nursing note reviewed.  Constitutional:      Appearance: She is well-developed. She is ill-appearing.  HENT:     Head: Normocephalic and atraumatic.     Right Ear: Hearing, tympanic membrane, ear canal and external ear normal.     Left Ear: Hearing, tympanic membrane, ear canal and external ear normal.  Eyes:     Conjunctiva/sclera: Conjunctivae normal.     Pupils: Pupils are equal, round, and reactive to light.  Neck:     Musculoskeletal: Neck supple.  Cardiovascular:     Rate and Rhythm: Normal rate and regular rhythm.     Heart sounds: Normal heart sounds. No murmur. No friction rub. No gallop.   Pulmonary:     Effort: Pulmonary effort is normal.     Breath sounds: Normal breath sounds. No wheezing, rhonchi or rales.  Lymphadenopathy:     Cervical: No cervical adenopathy.  Skin:    General: Skin is warm and dry.  Neurological:     Mental Status: She is alert and oriented to person, place, and time.     Results for orders placed or performed in visit on 05/17/18 (from the past 24 hour(s))  POC Influenza A&B(BINAX/QUICKVUE)     Status: Normal   Collection Time: 05/17/18 12:51 PM  Result Value Ref Range   Influenza A, POC Negative Negative   Influenza B, POC Negative Negative     ASSESSMENT and PLAN  1. Lower respiratory tract infection Discussed supportive measures, new meds r/se/b and RTC precautions.   2. Flu-like symptoms - POC Influenza A&B(BINAX/QUICKVUE)  Other orders - azithromycin (ZITHROMAX) 250 MG tablet; Take 2 tablets (500 mg total) by mouth daily for 1 day, THEN 1 tablet (250 mg total) daily for 4  days.  Return if symptoms worsen or fail to improve.    Rutherford Guys, MD Primary Care at [place], [place] [address] Ph.  [phone] Fax [phone]

## 2018-05-17 NOTE — Patient Instructions (Signed)
° ° ° °  If you have lab work done today you will be contacted with your lab results within the next 2 weeks.  If you have not heard from us then please contact us. The fastest way to get your results is to register for My Chart. ° ° °IF you received an x-ray today, you will receive an invoice from [facility]. Please contact [facility] at [phone] with questions or concerns regarding your invoice.  ° °IF you received labwork today, you will receive an invoice from [facility]. Please contact [facility] at [phone] with questions or concerns regarding your invoice.  ° °Our billing staff will not be able to assist you with questions regarding bills from these companies. ° °You will be contacted with the lab results as soon as they are available. The fastest way to get your results is to activate your My Chart account. Instructions are located on the last page of this paperwork. If you have not heard from us regarding the results in 2 weeks, please contact this office. °  ° ° ° °

## 2018-07-04 DIAGNOSIS — C678 Malignant neoplasm of overlapping sites of bladder: Secondary | ICD-10-CM | POA: Diagnosis not present

## 2018-10-01 ENCOUNTER — Other Ambulatory Visit: Payer: Self-pay | Admitting: Internal Medicine

## 2018-10-01 DIAGNOSIS — Z20822 Contact with and (suspected) exposure to covid-19: Secondary | ICD-10-CM

## 2018-10-01 DIAGNOSIS — R6889 Other general symptoms and signs: Secondary | ICD-10-CM | POA: Diagnosis not present

## 2018-10-04 ENCOUNTER — Encounter: Payer: Self-pay | Admitting: Podiatry

## 2018-10-04 ENCOUNTER — Other Ambulatory Visit: Payer: Self-pay

## 2018-10-04 ENCOUNTER — Other Ambulatory Visit: Payer: Self-pay | Admitting: Podiatry

## 2018-10-04 ENCOUNTER — Ambulatory Visit (INDEPENDENT_AMBULATORY_CARE_PROVIDER_SITE_OTHER): Payer: Medicare Other

## 2018-10-04 ENCOUNTER — Ambulatory Visit: Payer: Medicare Other | Admitting: Podiatry

## 2018-10-04 VITALS — Temp 97.8°F

## 2018-10-04 DIAGNOSIS — R600 Localized edema: Secondary | ICD-10-CM | POA: Diagnosis not present

## 2018-10-04 DIAGNOSIS — S92355A Nondisplaced fracture of fifth metatarsal bone, left foot, initial encounter for closed fracture: Secondary | ICD-10-CM

## 2018-10-04 DIAGNOSIS — S92354A Nondisplaced fracture of fifth metatarsal bone, right foot, initial encounter for closed fracture: Secondary | ICD-10-CM

## 2018-10-04 DIAGNOSIS — M778 Other enthesopathies, not elsewhere classified: Secondary | ICD-10-CM

## 2018-10-04 LAB — NOVEL CORONAVIRUS, NAA: SARS-CoV-2, NAA: NOT DETECTED

## 2018-10-04 NOTE — Progress Notes (Signed)
Subjective:   Patient ID: [name], female   DOB: 73 y.o.   MRN: [id number]   HPI Patient states she fell asleep on her left foot and is developed severe pain with swelling about 10 days ago.  Patient states she fell on it and that it is been a problem for her   ROS      Objective:  Physical Exam  Neurovascular status intact muscle strength is adequate with patient's of the left fifth metatarsal being very swollen and sore with exquisite discomfort in the mid shaft fifth metatarsal     Assessment:  Fractured fifth metatarsal left with significant edema process with negative Homans sign     Plan:  H&P x-ray reviewed condition discussed.  At this time I applied an Unna boot Ace wrap to try to compress of the swelling and placed an air fracture walker to completely immobilize.  I instructed on reduced activity elevation and reappoint in 4 weeks or earlier if needed  X-rays indicate midshaft fracture fifth metatarsal left with rotational component

## 2018-10-07 ENCOUNTER — Telehealth: Payer: Self-pay | Admitting: Podiatry

## 2018-10-07 ENCOUNTER — Telehealth: Payer: Self-pay | Admitting: *Deleted

## 2018-10-07 ENCOUNTER — Telehealth: Payer: Self-pay

## 2018-10-07 NOTE — Telephone Encounter (Signed)
Left a voicemail message for patient at [phone] (cell #) to answer their question regarding their big toe.  Patient had concerns when she got an injection from Dr. Paulla Dolly on 10/04/18 on her Right foot; big toe. She was concerned that her toe was still numb. I told patient that this is normal after getting an injection in the toe.  Told patient to call me back with any further questions or concerns.

## 2018-10-07 NOTE — Telephone Encounter (Signed)
Experiencing a lot of pain in left foot just wants to speak with Nurse.

## 2018-10-07 NOTE — Telephone Encounter (Signed)
Patient has broken bone in Lt foot and is in pain within the last 24 hours. Please advise.

## 2018-10-09 ENCOUNTER — Telehealth: Payer: Self-pay | Admitting: *Deleted

## 2018-10-09 NOTE — Telephone Encounter (Signed)
Returned patient's call on 10/07/18 regarding a concern they had with the una boot that Dr.Regal applied during their visit.  Patient stated, "I have had intense pain since Dr. Paulla Dolly put that soft cast on my foot". I advised patient to take the una boot off and that should make her foot feel better. Patient said she would do that.  I advised patient to call us back if that did not work and she still had pain.

## 2018-10-14 NOTE — Telephone Encounter (Signed)
[name] spoke with patient on 10/09/2018

## 2018-10-22 ENCOUNTER — Encounter: Payer: Self-pay | Admitting: Family Medicine

## 2018-10-22 ENCOUNTER — Other Ambulatory Visit: Payer: Self-pay

## 2018-10-22 ENCOUNTER — Ambulatory Visit (INDEPENDENT_AMBULATORY_CARE_PROVIDER_SITE_OTHER): Payer: Medicare Other | Admitting: Family Medicine

## 2018-10-22 VITALS — BP 136/80 | HR 76 | Temp 98.6°F | Ht 69.69 in | Wt 218.0 lb

## 2018-10-22 DIAGNOSIS — E7800 Pure hypercholesterolemia, unspecified: Secondary | ICD-10-CM | POA: Diagnosis not present

## 2018-10-22 DIAGNOSIS — I10 Essential (primary) hypertension: Secondary | ICD-10-CM | POA: Diagnosis not present

## 2018-10-22 MED ORDER — AMLODIPINE BESYLATE 10 MG PO TABS: 10.0000 mg | ORAL_TABLET | Freq: Every day | ORAL | 3 refills | Status: DC

## 2018-10-22 NOTE — Patient Instructions (Signed)
° ° ° °  If you have lab work done today you will be contacted with your lab results within the next 2 weeks.  If you have not heard from us then please contact us. The fastest way to get your results is to register for My Chart. ° ° °IF you received an x-ray today, you will receive an invoice from [facility]. Please contact [facility] at [phone] with questions or concerns regarding your invoice.  ° °IF you received labwork today, you will receive an invoice from [facility]. Please contact [facility] at [phone] with questions or concerns regarding your invoice.  ° °Our billing staff will not be able to assist you with questions regarding bills from these companies. ° °You will be contacted with the lab results as soon as they are available. The fastest way to get your results is to activate your My Chart account. Instructions are located on the last page of this paperwork. If you have not heard from us regarding the results in 2 weeks, please contact this office. °  ° ° ° °

## 2018-10-22 NOTE — Progress Notes (Signed)
7/14/202010:03 AM  [name] 12-Oct-1945, 73 y.o., female [id number]  Chief Complaint  Patient presents with  . Follow-up    htn hlp, take meds daily, monitors bp, numbers are not always perfect    HPI:   Patient is a 73 y.o. female with past medical history significant for HTN and HLP who presents today for routine followup  Last OV feb 2020 for LRTI She broke left foot metatarsal, healing ok, has been sedentary for the past month Otherwise doing well Does not check BP at home She has actually been doing 42m amlodipine not 595mDoes take her medication regularly Has no acute concerns   Lab Results  Component Value Date   CHOL 248 (H) 03/19/2018   HDL 47 03/19/2018   LDLCALC 164 (H) 03/19/2018   TRIG 183 (H) 03/19/2018   CHOLHDL 5.3 (H) 03/19/2018    Lab Results  Component Value Date   CREATININE 0.75 03/19/2018   BUN 16 03/19/2018   NA 142 03/19/2018   K 4.8 03/19/2018   CL 104 03/19/2018   CO2 22 03/19/2018    Depression screen PHQ 2/9 10/22/2018 05/17/2018 04/13/2018  Decreased Interest 0 0 0  Down, Depressed, Hopeless 0 0 0  PHQ - 2 Score 0 0 0    Fall Risk  10/22/2018 05/17/2018 04/13/2018 03/26/2018 01/02/2018  Falls in the past year? 1 0 0 0 No  Number falls in past yr: 0 0 - - -  Injury with Fall? 1 0 - - -  Comment wears boot on left foot - - - -  Follow up - Falls evaluation completed - - -     Allergies  Allergen Reactions  . Hycodan [Hydrocodone-Homatropine] Hives  . Other     Unknown cough syrup    Prior to Admission medications   Medication Sig Start Date End Date Taking? Authorizing Provider  amLODipine (NORVASC) 5 MG tablet Take 5 mg by mouth daily. 07/29/18  Yes [provider]  triamterene-hydrochlorothiazide (MAXZIDE-25) 37.5-25 MG tablet Take 1 tablet by mouth daily. 04/13/18  Yes SaRutherford GuysMD    Past Medical History:  Diagnosis Date  . 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 History of adenomatous polyp of colon   . History of recurrent UTIs   . Hyperlipidemia   . Renal cyst, left    NON-COMPLEX  . Wears contact lenses     Past Surgical History:  Procedure Laterality Date  . BENIGN BREAST BX Right 1995  . BUNIONECTOMY Right 2000  . COLONOSCOPY W/ POLYPECTOMY  01/06/2013   single sessile polyp ascending colon.  [facility].  . Marland KitchenYSTOSCOPY W/ RETROGRADES Bilateral 12/10/2013   Procedure: RETROGRADE PYELOGRAM  ;  Surgeon: ThAlexis FrockMD;  [facility];  Laterality: Bilateral;  . CYSTOSCOPY W/ RETROGRADES Bilateral 07/15/2014   Procedure: CYSTOSCOPY WITH RETROGRADE PYELOGRAM AND INSTILLATION OF MITOMYCIN C;  Surgeon: ThAlexis FrockMD;  [facility];  Laterality: Bilateral;  . CYSTOSCOPY W/ RETROGRADES Bilateral 12/31/2015   Procedure: CYSTOSCOPY WITH RETROGRADE PYELOGRAM;  Surgeon: ThAlexis FrockMD;  [facility];  Laterality: Bilateral;  . CYSTOSCOPY W/ RETROGRADES Bilateral 11/17/2016   Procedure: CYSTOSCOPY WITH RETROGRADE PYELOGRAM;  Surgeon: MaAlexis FrockMD;  [facility];  Laterality: Bilateral;  . CYSTOSCOPY W/ URETERAL STENT  PLACEMENT Bilateral 10/22/2013   Procedure: CYSTOSCOPY WITH RETROGRADE PYELOGRAM;  Surgeon: Alexis Frock, MD;  Location: [facility];  Service: Urology;  Laterality: Bilateral;  . CYSTOSCOPY WITH RETROGRADE PYELOGRAM, URETEROSCOPY AND STENT PLACEMENT Bilateral 04/14/2015   Procedure: CYSTOSCOPY WITH RETROGRADE PYELOGRAM, URETEROSCOPY AND RIGHT STENT PLACEMENT;  Surgeon: Alexis Frock, MD;  Location: [facility];  Service: Urology;  Laterality: Bilateral;  . TONSILLECTOMY  69   (age 4)  . TRANSURETHRAL RESECTION OF BLADDER TUMOR N/A 12/31/2015   Procedure: TRANSURETHRAL RESECTION OF BLADDER TUMOR (TURBT) WITH GYRUS;   Surgeon: Alexis Frock, MD;  Location: [facility];  Service: Urology;  Laterality: N/A;  . TRANSURETHRAL RESECTION OF BLADDER TUMOR N/A 11/17/2016   Procedure: TRANSURETHRAL RESECTION OF BLADDER TUMOR (TURBT);  Surgeon: Alexis Frock, MD;  Location: [facility];  Service: Urology;  Laterality: N/A;  . TRANSURETHRAL RESECTION OF BLADDER TUMOR WITH GYRUS (TURBT-GYRUS) N/A 10/22/2013   Procedure: TRANSURETHRAL RESECTION OF BLADDER TUMOR WITH ERBE.;  Surgeon: Alexis Frock, MD;  Location: [facility];  Service: Urology;  Laterality: N/A;  . TRANSURETHRAL RESECTION OF BLADDER TUMOR WITH GYRUS (TURBT-GYRUS) N/A 12/10/2013   Procedure: RESTAGING TRANSURETHRAL RESECTION OF BLADDER TUMOR WITH GYRUS (TURBT-GYRUS);  Surgeon: Alexis Frock, MD;  Location: [facility];  Service: Urology;  Laterality: N/A;  . TRANSURETHRAL RESECTION OF BLADDER TUMOR WITH GYRUS (TURBT-GYRUS) N/A 07/15/2014   Procedure: TRANSURETHRAL RESECTION OF BLADDER TUMOR WITH GYRUS (TURBT-GYRUS);  Surgeon: Alexis Frock, MD;  Location: [facility];  Service: Urology;  Laterality: N/A;  . TRANSURETHRAL RESECTION OF BLADDER TUMOR WITH GYRUS (TURBT-GYRUS) N/A 04/14/2015   Procedure: TRANSURETHRAL RESECTION OF BLADDER TUMOR WITH GYRUS (TURBT-GYRUS);  Surgeon: Alexis Frock, MD;  Location: [facility];  Service: Urology;  Laterality: N/A;    Social History   Tobacco Use  . Smoking status: Former Smoker    Packs/day: 0.15    Years: 20.00    Pack years: 3.00    Types: Cigarettes    Quit date: 10/17/2012    Years since quitting: 6.0  . Smokeless tobacco: Never Used  Substance Use Topics  . Alcohol use: Yes    Comment: OCCASIONAL    Family History  Problem Relation Age of Onset  . Hypertension Mother   . Arthritis Mother        knee replacement; shoulder replacement  . Heart disease Mother        AAA; mild AMI in 2018  . Stroke Mother 13        TIA  . Cancer Father        prostate cancer  . Cancer Brother        prostate cancer  . Cancer Brother 50       Melanoma  . Mental illness Sister 59       bipolar disorder    Review of Systems  Constitutional: Negative for chills and fever.  Respiratory: Negative for cough and shortness of breath.   Cardiovascular: Negative for chest pain, palpitations and leg swelling.  Gastrointestinal: Negative for abdominal pain, nausea and vomiting.     OBJECTIVE:  Today's Vitals   10/22/18 0955  BP: 136/80  Pulse: 76  Temp: 98.6 F (37 C)  TempSrc: Oral  SpO2: 96%  Weight: 218 lb (98.9 kg)  Height: 5' 9.69" (1.77 m)   Body mass index is 31.56 kg/m.  Wt Readings from Last 3 Encounters:  10/22/18 218 lb (98.9 kg)  05/17/18 216  lb 6.4 oz (98.2 kg)  04/13/18 219 lb 9.6 oz (99.6 kg)    Physical Exam Vitals signs and nursing note reviewed.  Constitutional:      Appearance: She is well-developed.  HENT:     Head: Normocephalic and atraumatic.     Mouth/Throat:     Pharynx: No oropharyngeal exudate.  Eyes:     General: No scleral icterus.    Conjunctiva/sclera: Conjunctivae normal.     Pupils: Pupils are equal, round, and reactive to light.  Neck:     Musculoskeletal: Neck supple.  Cardiovascular:     Rate and Rhythm: Normal rate and regular rhythm.     Heart sounds: Normal heart sounds. No murmur. No friction rub. No gallop.   Pulmonary:     Effort: Pulmonary effort is normal.     Breath sounds: Normal breath sounds. No wheezing or rales.  Skin:    General: Skin is warm and dry.  Neurological:     Mental Status: She is alert and oriented to person, place, and time.     ASSESSMENT and PLAN  1. Pure hypercholesterolemia Rechecking labs. Discussed starting atorvastatin if indicated, reviewed r/se/b. - Lipid panel - CMP14+EGFR - TSH  2. Essential hypertension, benign Controlled. Continue current regime.  - Lipid panel - CMP14+EGFR - TSH  Other orders -  amLODipine (NORVASC) 10 MG tablet; Take 1 tablet (10 mg total) by mouth daily.  Return in about 5 months (around 03/27/2019) for medicare annual wellness.    Rutherford Guys, MD [facility] [place], [place] [address] Ph.  [phone] Fax [phone]

## 2018-10-23 LAB — CMP14+EGFR
ALT: 46 IU/L — ABNORMAL HIGH (ref 0–32)
AST: 34 IU/L (ref 0–40)
Albumin/Globulin Ratio: 2 (ref 1.2–2.2)
Albumin: 4.8 g/dL — ABNORMAL HIGH (ref 3.7–4.7)
Alkaline Phosphatase: 81 IU/L (ref 39–117)
BUN/Creatinine Ratio: 15 (ref 12–28)
BUN: 14 mg/dL (ref 8–27)
Bilirubin Total: 0.5 mg/dL (ref 0.0–1.2)
CO2: 22 mmol/L (ref 20–29)
Calcium: 9.9 mg/dL (ref 8.7–10.3)
Chloride: 102 mmol/L (ref 96–106)
Creatinine, Ser: 0.94 mg/dL (ref 0.57–1.00)
GFR calc Af Amer: 71 mL/min/{1.73_m2} (ref >59)
GFR calc non Af Amer: 61 mL/min/{1.73_m2} (ref >59)
Globulin, Total: 2.4 g/dL (ref 1.5–4.5)
Glucose: 95 mg/dL (ref 65–99)
Potassium: 4.5 mmol/L (ref 3.5–5.2)
Sodium: 139 mmol/L (ref 134–144)
Total Protein: 7.2 g/dL (ref 6.0–8.5)

## 2018-10-23 LAB — LIPID PANEL
Chol/HDL Ratio: 6.4 ratio — ABNORMAL HIGH (ref 0.0–4.4)
Cholesterol, Total: 287 mg/dL — ABNORMAL HIGH (ref 100–199)
HDL: 45 mg/dL (ref >39)
LDL Calculated: 195 mg/dL — ABNORMAL HIGH (ref 0–99)
Triglycerides: 237 mg/dL — ABNORMAL HIGH (ref 0–149)
VLDL Cholesterol Cal: 47 mg/dL — ABNORMAL HIGH (ref 5–40)

## 2018-10-23 LAB — TSH: TSH: 2.14 u[IU]/mL (ref 0.450–4.500)

## 2018-11-01 ENCOUNTER — Other Ambulatory Visit: Payer: Self-pay

## 2018-11-01 ENCOUNTER — Encounter: Payer: Self-pay | Admitting: Podiatry

## 2018-11-01 ENCOUNTER — Ambulatory Visit: Payer: Medicare Other | Admitting: Podiatry

## 2018-11-01 ENCOUNTER — Ambulatory Visit (INDEPENDENT_AMBULATORY_CARE_PROVIDER_SITE_OTHER): Payer: Medicare Other

## 2018-11-01 DIAGNOSIS — S92355D Nondisplaced fracture of fifth metatarsal bone, left foot, subsequent encounter for fracture with routine healing: Secondary | ICD-10-CM

## 2018-11-01 DIAGNOSIS — Z13820 Encounter for screening for osteoporosis: Secondary | ICD-10-CM | POA: Diagnosis not present

## 2018-11-04 DIAGNOSIS — Z1231 Encounter for screening mammogram for malignant neoplasm of breast: Secondary | ICD-10-CM | POA: Diagnosis not present

## 2018-11-04 DIAGNOSIS — Z803 Family history of malignant neoplasm of breast: Secondary | ICD-10-CM | POA: Diagnosis not present

## 2018-11-04 LAB — HM MAMMOGRAPHY

## 2018-11-06 MED ORDER — ATORVASTATIN CALCIUM 20 MG PO TABS: 20.0000 mg | ORAL_TABLET | Freq: Every day | ORAL | 3 refills | Status: DC

## 2018-11-06 NOTE — Progress Notes (Signed)
Subjective:   Patient ID: [name], female   DOB: 73 y.o.   MRN: [id number]   HPI Patient presents stating seems to be getting better and would like to get rid of the boot if possible but it is been very helpful   ROS      Objective:  Physical Exam  Neurovascular status intact with patient's left fifth metatarsal showing continued edema but significant reduction of pain upon palpation     Assessment:  Fracture fifth metatarsal shaft left that is gradually healing and clinically is doing much better     Plan:  H&P discussed condition and at this point I went ahead and I recommended continued boot usage for gradual reduction over the next 2 to 4 weeks.  Continue ice therapy reduced activity and immobilization and reappoint to recheck in 4 weeks  X-ray indicates that the fracture fifth metatarsal shaft left is showing multiple signs of secondary bone healing

## 2018-11-06 NOTE — Addendum Note (Signed)
Addended by: Rutherford Guys on: 11/06/2018 06:59 PM   Modules accepted: Orders

## 2018-11-29 ENCOUNTER — Telehealth: Payer: Self-pay

## 2018-11-29 ENCOUNTER — Other Ambulatory Visit: Payer: Self-pay

## 2018-11-29 DIAGNOSIS — Z13820 Encounter for screening for osteoporosis: Secondary | ICD-10-CM

## 2018-12-02 ENCOUNTER — Ambulatory Visit (INDEPENDENT_AMBULATORY_CARE_PROVIDER_SITE_OTHER): Payer: Medicare Other

## 2018-12-02 ENCOUNTER — Ambulatory Visit: Payer: Medicare Other | Admitting: Podiatry

## 2018-12-02 ENCOUNTER — Other Ambulatory Visit: Payer: Self-pay

## 2018-12-02 ENCOUNTER — Encounter: Payer: Self-pay | Admitting: Podiatry

## 2018-12-02 VITALS — Temp 98.0°F

## 2018-12-02 DIAGNOSIS — S92354D Nondisplaced fracture of fifth metatarsal bone, right foot, subsequent encounter for fracture with routine healing: Secondary | ICD-10-CM

## 2018-12-02 DIAGNOSIS — S92355D Nondisplaced fracture of fifth metatarsal bone, left foot, subsequent encounter for fracture with routine healing: Secondary | ICD-10-CM

## 2018-12-02 NOTE — Progress Notes (Signed)
Subjective:   Patient ID: [name], female   DOB: 73 y.o.   MRN: [id number]   HPI Patient states is feeling much better with minimal discomfort and states that she is now walking without the boot and has mild pain but not severe    ROS      Objective:  Physical Exam  Neurovascular status intact with patient's lateral foot healing well with mild swelling noted and no acute pain noted.     Assessment:  Healing well from fracture of the fifth metatarsal left     Plan:  H&P condition reviewed and recommended compression of the area and ice as needed but slow return to normal activity and significant reduction of boot usage  X-ray indicates there was a severe fracture left fifth metatarsal shaft but it is healing with no indications of nonhealing or secondary type process is currently

## 2018-12-04 ENCOUNTER — Ambulatory Visit: Payer: Medicare Other | Admitting: Podiatry

## 2018-12-12 ENCOUNTER — Other Ambulatory Visit: Payer: Self-pay | Admitting: Family Medicine

## 2018-12-31 DIAGNOSIS — N3020 Other chronic cystitis without hematuria: Secondary | ICD-10-CM | POA: Diagnosis not present

## 2018-12-31 DIAGNOSIS — C678 Malignant neoplasm of overlapping sites of bladder: Secondary | ICD-10-CM | POA: Diagnosis not present

## 2019-01-27 ENCOUNTER — Other Ambulatory Visit: Payer: Self-pay

## 2019-01-27 DIAGNOSIS — Z20822 Contact with and (suspected) exposure to covid-19: Secondary | ICD-10-CM

## 2019-01-29 LAB — NOVEL CORONAVIRUS, NAA: SARS-CoV-2, NAA: NOT DETECTED

## 2019-03-31 ENCOUNTER — Ambulatory Visit (INDEPENDENT_AMBULATORY_CARE_PROVIDER_SITE_OTHER): Payer: Medicare Other | Admitting: Family Medicine

## 2019-03-31 ENCOUNTER — Encounter: Payer: Self-pay | Admitting: Family Medicine

## 2019-03-31 ENCOUNTER — Other Ambulatory Visit: Payer: Self-pay

## 2019-03-31 VITALS — BP 135/86 | HR 77 | Temp 98.4°F | Ht 69.69 in | Wt 220.2 lb

## 2019-03-31 DIAGNOSIS — Z23 Encounter for immunization: Secondary | ICD-10-CM

## 2019-03-31 DIAGNOSIS — Z0000 Encounter for general adult medical examination without abnormal findings: Secondary | ICD-10-CM

## 2019-03-31 DIAGNOSIS — E7800 Pure hypercholesterolemia, unspecified: Secondary | ICD-10-CM | POA: Diagnosis not present

## 2019-03-31 DIAGNOSIS — I10 Essential (primary) hypertension: Secondary | ICD-10-CM | POA: Diagnosis not present

## 2019-03-31 DIAGNOSIS — Z0001 Encounter for general adult medical examination with abnormal findings: Secondary | ICD-10-CM

## 2019-03-31 MED ORDER — ATORVASTATIN CALCIUM 20 MG PO TABS: 20.0000 mg | ORAL_TABLET | Freq: Every day | ORAL | 3 refills | Status: DC

## 2019-03-31 MED ORDER — AMLODIPINE BESYLATE 10 MG PO TABS: 10.0000 mg | ORAL_TABLET | Freq: Every day | ORAL | 3 refills | Status: DC

## 2019-03-31 NOTE — Patient Instructions (Addendum)
   If you have lab work done today you will be contacted with your lab results within the next 2 weeks.  If you have not heard from us then please contact us. The fastest way to get your results is to register for My Chart.   IF you received an x-ray today, you will receive an invoice from [facility]. Please contact [facility] at [phone] with questions or concerns regarding your invoice.   IF you received labwork today, you will receive an invoice from [facility]. Please contact [facility] at [phone] with questions or concerns regarding your invoice.   Our billing staff will not be able to assist you with questions regarding bills from these companies.  You will be contacted with the lab results as soon as they are available. The fastest way to get your results is to activate your My Chart account. Instructions are located on the last page of this paperwork. If you have not heard from us regarding the results in 2 weeks, please contact this office.     Preventive Care 65 Years and Older, Female Preventive care refers to lifestyle choices and visits with your health care provider that can promote health and wellness. This includes:  A yearly physical exam. This is also called an annual well check.  Regular dental and eye exams.  Immunizations.  Screening for certain conditions.  Healthy lifestyle choices, such as diet and exercise. What can I expect for my preventive care visit? Physical exam Your health care provider will check:  Height and weight. These may be used to calculate body mass index (BMI), which is a measurement that tells if you are at a healthy weight.  Heart rate and blood pressure.  Your skin for abnormal spots. Counseling Your health care provider may ask you questions about:  Alcohol, tobacco, and drug use.  Emotional well-being.  Home and relationship well-being.  Sexual activity.  Eating habits.  History of  falls.  Memory and ability to understand (cognition).  Work and work environment.  Pregnancy and menstrual history. What immunizations do I need?  Influenza (flu) vaccine  This is recommended every year. Tetanus, diphtheria, and pertussis (Tdap) vaccine  You may need a Td booster every 10 years. Varicella (chickenpox) vaccine  You may need this vaccine if you have not already been vaccinated. Zoster (shingles) vaccine  You may need this after age 60. Pneumococcal conjugate (PCV13) vaccine  One dose is recommended after age 71. Pneumococcal polysaccharide (PPSV23) vaccine  One dose is recommended after age 71. Measles, mumps, and rubella (MMR) vaccine  You may need at least one dose of MMR if you were born in 1957 or later. You may also need a second dose. Meningococcal conjugate (MenACWY) vaccine  You may need this if you have certain conditions. Hepatitis A vaccine  You may need this if you have certain conditions or if you travel or work in places where you may be exposed to hepatitis A. Hepatitis B vaccine  You may need this if you have certain conditions or if you travel or work in places where you may be exposed to hepatitis B. Haemophilus influenzae type b (Hib) vaccine  You may need this if you have certain conditions. You may receive vaccines as individual doses or as more than one vaccine together in one shot (combination vaccines). Talk with your health care provider about the risks and benefits of combination vaccines. What tests do I need? Blood tests  Lipid and cholesterol levels. These   may be checked every 5 years, or more frequently depending on your overall health.  Hepatitis C test.  Hepatitis B test. Screening  Lung cancer screening. You may have this screening every year starting at age 55 if you have a 30-pack-year history of smoking and currently smoke or have quit within the past 15 years.  Colorectal cancer screening. All adults should  have this screening starting at age 50 and continuing until age 75. Your health care provider may recommend screening at age 45 if you are at increased risk. You will have tests every 1-10 years, depending on your results and the type of screening test.  Diabetes screening. This is done by checking your blood sugar (glucose) after you have not eaten for a while (fasting). You may have this done every 1-3 years.  Mammogram. This may be done every 1-2 years. Talk with your health care provider about how often you should have regular mammograms.  BRCA-related cancer screening. This may be done if you have a family history of breast, ovarian, tubal, or peritoneal cancers. Other tests  Sexually transmitted disease (STD) testing.  Bone density scan. This is done to screen for osteoporosis. You may have this done starting at age 71. Follow these instructions at home: Eating and drinking  Eat a diet that includes fresh fruits and vegetables, whole grains, lean protein, and low-fat dairy products. Limit your intake of foods with high amounts of sugar, saturated fats, and salt.  Take vitamin and mineral supplements as recommended by your health care provider.  Do not drink alcohol if your health care provider tells you not to drink.  If you drink alcohol: ? Limit how much you have to 0-1 drink a day. ? Be aware of how much alcohol is in your drink. In the [place] one drink equals one 12 oz bottle of beer (355 mL), one 5 oz glass of wine (148 mL), or one 1 oz glass of hard liquor (44 mL). Lifestyle  Take daily care of your teeth and gums.  Stay active. Exercise for at least 30 minutes on 5 or more days each week.  Do not use any products that contain nicotine or tobacco, such as cigarettes, e-cigarettes, and chewing tobacco. If you need help quitting, ask your health care provider.  If you are sexually active, practice safe sex. Use a condom or other form of protection in order to prevent STIs  (sexually transmitted infections).  Talk with your health care provider about taking a low-dose aspirin or statin. What's next?  Go to your health care provider once a year for a well check visit.  Ask your health care provider how often you should have your eyes and teeth checked.  Stay up to date on all vaccines. This information is not intended to replace advice given to you by your health care provider. Make sure you discuss any questions you have with your health care provider. Document Released: 04/23/2015 Document Revised: 03/21/2018 Document Reviewed: 03/21/2018 Elsevier Patient Education  2020 [employer]  

## 2019-03-31 NOTE — Progress Notes (Signed)
Presents today for [employer] Visit-Subsequent.   Date of last exam: Mar 26 2018  Interpreter used for this visit? n/a  Patient Care Team: Rutherford Guys, MD as PCP - General (Family Medicine) Wonda Horner, MD as Consulting Physician (Gastroenterology) Alexis Frock, MD as Consulting Physician (Urology)   Other items to address today: none  Cancer Screening: Cervical: n/a Breast: yes, mammogram July 2020 Colon: 2014, sessile polyps, needs to schedule repeat Prostate: n/a   Other Screening: Last screening for diabetes: 2018 Last lipid screening: HLP, see below Dexa: Nov 2016, normal, repeat in 5 years  Dentist: 2 weeks ago Eye: wears contact lenses, yearly  Lab Results  Component Value Date   HGBA1C 5.6 03/21/2017    Lab Results  Component Value Date   CHOL 287 (H) 10/22/2018   HDL 45 10/22/2018   LDLCALC 195 (H) 10/22/2018   TRIG 237 (H) 10/22/2018   CHOLHDL 6.4 (H) 10/22/2018  started atorvastatin 20mg   ADVANCE DIRECTIVES: Discussed: no On File: yes   Immunization status:  Immunization History  Administered Date(s) Administered  . Influenza, High Dose Seasonal PF 01/02/2018  . Influenza,inj,Quad PF,6+ Mos 03/02/2014, 12/25/2014, 12/28/2015, 03/21/2017  . Pneumococcal Conjugate-13 03/02/2014  . Pneumococcal Polysaccharide-23 12/25/2014     Health Maintenance Due  Topic Date Due  . INFLUENZA VACCINE  11/09/2018     Functional Status Survey: Is the patient deaf or have difficulty hearing?: No Does the patient have difficulty seeing, even when wearing glasses/contacts?: No Does the patient have difficulty concentrating, remembering, or making decisions?: No Does the patient have difficulty walking or climbing stairs?: No Does the patient have difficulty dressing or bathing?: No Does the patient have difficulty doing errands alone such as visiting a doctor's office or shopping?: No   6CIT Screen 03/31/2019 03/31/2019  03/26/2018  What Year? 0 points 0 points 0 points  What month? 0 points 0 points 0 points  What time? 0 points 0 points 0 points  Count back from 20 0 points - 0 points  Months in reverse 0 points - 0 points  Repeat phrase 0 points - 0 points  Total Score 0 - 0     Home Environment: lives alone, no safety concerns  Urinary Incontinence Screening: no issues  Patient Active Problem List   Diagnosis Date Noted  . Class 1 obesity due to excess calories with serious comorbidity and body mass index (BMI) of 32.0 to 32.9 in adult 03/27/2017  . Family history of breast cancer in sister 03/21/2017  . Family history of melanoma 03/21/2017  . Pure hypercholesterolemia 12/25/2014  . Bladder cancer ([place]) 09/29/2013     Past Medical History:  Diagnosis Date  . Bladder cancer ([place]) [place]  . Duplex kidney    RIGHT  . History of adenomatous polyp of colon   . History of recurrent UTIs   . Hyperlipidemia   . Renal cyst, left    NON-COMPLEX  . Wears contact lenses      Past Surgical History:  Procedure Laterality Date  . BENIGN BREAST BX Right 1995  . BUNIONECTOMY Right 2000  . COLONOSCOPY W/ POLYPECTOMY  01/06/2013   single sessile polyp ascending colon.  [facility].  Marland Kitchen CYSTOSCOPY W/ RETROGRADES Bilateral 12/10/2013   Procedure: RETROGRADE PYELOGRAM  ;  Surgeon: Alexis Frock, MD;  Location[facility];  Service: Urology;  Laterality: Bilateral;  . CYSTOSCOPY W/ RETROGRADES Bilateral 07/15/2014  Procedure: CYSTOSCOPY WITH RETROGRADE PYELOGRAM AND INSTILLATION OF MITOMYCIN C;  Surgeon: Alexis Frock, MD;  Location: [facility];  Service: Urology;  Laterality: Bilateral;  . CYSTOSCOPY W/ RETROGRADES Bilateral 12/31/2015   Procedure: CYSTOSCOPY WITH RETROGRADE PYELOGRAM;  Surgeon: Alexis Frock, MD;  Location: [facility];  Service: Urology;  Laterality: Bilateral;  . CYSTOSCOPY W/ RETROGRADES Bilateral 11/17/2016    Procedure: CYSTOSCOPY WITH RETROGRADE PYELOGRAM;  Surgeon: Alexis Frock, MD;  Location: [facility];  Service: Urology;  Laterality: Bilateral;  . CYSTOSCOPY W/ URETERAL STENT PLACEMENT Bilateral 10/22/2013   Procedure: CYSTOSCOPY WITH RETROGRADE PYELOGRAM;  Surgeon: Alexis Frock, MD;  Location: [facility];  Service: Urology;  Laterality: Bilateral;  . CYSTOSCOPY WITH RETROGRADE PYELOGRAM, URETEROSCOPY AND STENT PLACEMENT Bilateral 04/14/2015   Procedure: CYSTOSCOPY WITH RETROGRADE PYELOGRAM, URETEROSCOPY AND RIGHT STENT PLACEMENT;  Surgeon: Alexis Frock, MD;  Location: [facility];  Service: Urology;  Laterality: Bilateral;  . TONSILLECTOMY  30   (age 27)  . TRANSURETHRAL RESECTION OF BLADDER TUMOR N/A 12/31/2015   Procedure: TRANSURETHRAL RESECTION OF BLADDER TUMOR (TURBT) WITH GYRUS;  Surgeon: Alexis Frock, MD;  Location: [facility];  Service: Urology;  Laterality: N/A;  . TRANSURETHRAL RESECTION OF BLADDER TUMOR N/A 11/17/2016   Procedure: TRANSURETHRAL RESECTION OF BLADDER TUMOR (TURBT);  Surgeon: Alexis Frock, MD;  Location: [facility];  Service: Urology;  Laterality: N/A;  . TRANSURETHRAL RESECTION OF BLADDER TUMOR WITH GYRUS (TURBT-GYRUS) N/A 10/22/2013   Procedure: TRANSURETHRAL RESECTION OF BLADDER TUMOR WITH ERBE.;  Surgeon: Alexis Frock, MD;  Location: [facility];  Service: Urology;  Laterality: N/A;  . TRANSURETHRAL RESECTION OF BLADDER TUMOR WITH GYRUS (TURBT-GYRUS) N/A 12/10/2013   Procedure: RESTAGING TRANSURETHRAL RESECTION OF BLADDER TUMOR WITH GYRUS (TURBT-GYRUS);  Surgeon: Alexis Frock, MD;  Location: [facility];  Service: Urology;  Laterality: N/A;  . TRANSURETHRAL RESECTION OF BLADDER TUMOR WITH GYRUS (TURBT-GYRUS) N/A 07/15/2014   Procedure: TRANSURETHRAL RESECTION OF BLADDER TUMOR WITH GYRUS (TURBT-GYRUS);  Surgeon: Alexis Frock, MD;  Location: [facility];  Service: Urology;  Laterality: N/A;  . TRANSURETHRAL RESECTION OF BLADDER TUMOR WITH GYRUS (TURBT-GYRUS) N/A 04/14/2015   Procedure: TRANSURETHRAL RESECTION OF BLADDER TUMOR WITH GYRUS (TURBT-GYRUS);  Surgeon: Alexis Frock, MD;  Location: [facility];  Service: Urology;  Laterality: N/A;     Family History  Problem Relation Age of Onset  . Hypertension Mother   . Arthritis Mother        knee replacement; shoulder replacement  . Heart disease Mother        AAA; mild AMI in 2018  . Stroke Mother 72       TIA  . Cancer Father        prostate cancer  . Cancer Brother        prostate cancer  . Cancer Brother 50       Melanoma  . Mental illness Sister 58       bipolar disorder     Social History   Socioeconomic History  . Marital status: Divorced    Spouse name: Not on file  . Number of children: 3  . Years of education: Not on file  . Highest education level: Not on file  Occupational History  . 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  Tobacco Use  . Smoking status: Former Smoker    Packs/day: 0.15  Years: 20.00    Pack years: 3.00    Types: Cigarettes    Quit date: 10/17/2012    Years since quitting: 6.4  . Smokeless tobacco: Never Used  Substance and Sexual Activity  . Alcohol use: Yes    Comment: OCCASIONAL  . Drug use: No  . Sexual activity: Not Currently    Birth control/protection: Post-menopausal  Other Topics Concern  . Not on file  Social History Narrative   Marital status:  Divorced; not dating in 2018.      Children:  3 children; 2 grandchildren (5,4); family within walking distance.        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      Tobacco:  None; quit in 09/2012.      Alcohol: 1 drink per week.       Drugs:  None      Exercise:  One hour of water aerobics 3-4 days per week.      Seatbelt: 100%       Guns:  none      ADLs: independent with all ADLs; drives; no assistant device.      Advanced Directives:  Yes; FULL CODE; no prolonged measures.   Social Determinants of Health   Financial Resource Strain:   . Difficulty of Paying Living Expenses: Not on file  Food Insecurity:   . Worried About Charity fundraiser in the Last Year: Not on file  . Ran Out of Food in the Last Year: Not on file  Transportation Needs:   . Lack of Transportation (Medical): Not on file  . Lack of Transportation (Non-Medical): Not on file  Physical Activity:   . Days of Exercise per Week: Not on file  . Minutes of Exercise per Session: Not on file  Stress:   . Feeling of Stress : Not on file  Social Connections:   . Frequency of Communication with Friends and Family: Not on file  . Frequency of Social Gatherings with Friends and Family: Not on file  . Attends Religious Services: Not on file  . Active Member of Clubs or Organizations: Not on file  . Attends Archivist Meetings: Not on file  . Marital Status: Not on file  Intimate Partner Violence:   . Fear of Current or Ex-Partner: Not on file  . Emotionally Abused: Not on file  . Physically Abused: Not on file  . Sexually Abused: Not on file     Allergies  Allergen Reactions  . Hycodan [Hydrocodone-Homatropine] Hives  . Other     Unknown cough syrup     Prior to Admission medications   Medication Sig Start Date End Date Taking? Authorizing Provider  amLODipine (NORVASC) 10 MG tablet Take 10 mg by mouth daily. 03/16/19  Yes [provider]  atorvastatin (LIPITOR) 20 MG tablet Take 1 tablet (20 mg total) by mouth daily. 11/06/18  Yes Rutherford Guys, MD  triamterene-hydrochlorothiazide [facility]) 37.5-25 MG tablet TAKE 1 TABLET BY MOUTH EVERY DAY 12/12/18  Yes Rutherford Guys, MD     Depression screen [facility] 2/9 03/31/2019 10/22/2018 05/17/2018 04/13/2018 03/26/2018  Decreased Interest 0 0 0 0 0  Down, Depressed, Hopeless 0 0 0 0  0  PHQ - 2 Score 0 0 0 0 0     Fall Risk  03/31/2019 10/22/2018 05/17/2018 04/13/2018 03/26/2018  Falls in the past year? 0 1 0 0 0  Number falls  in past yr: 0 0 0 - -  Injury with Fall? 0 1 0 - -  Comment - wears boot on left foot - - -  Follow up - - Falls evaluation completed - -    Review of Systems  Constitutional: Negative for chills and fever.  Respiratory: Negative for cough and shortness of breath.   Cardiovascular: Negative for chest pain, palpitations and leg swelling.  Gastrointestinal: Negative for abdominal pain, nausea and vomiting.  All other systems reviewed and are negative.    PHYSICAL EXAM: BP 135/86   Pulse 77   Temp 98.4 F (36.9 C)   Ht 5' 9.69" (1.77 m)   Wt 220 lb 3.2 oz (99.9 kg)   SpO2 98%   BMI 31.88 kg/m    Wt Readings from Last 3 Encounters:  03/31/19 220 lb 3.2 oz (99.9 kg)  10/22/18 218 lb (98.9 kg)  05/17/18 216 lb 6.4 oz (98.2 kg)   Fasting this morning  Physical Exam  Constitutional: She is oriented to person, place, and time and well-developed, well-nourished, and in no distress.  HENT:  Head: Normocephalic and atraumatic.  Right Ear: Hearing, tympanic membrane, external ear and ear canal normal.  Left Ear: Hearing, tympanic membrane, external ear and ear canal normal.  Mouth/Throat: Oropharynx is clear and moist.  Eyes: Pupils are equal, round, and reactive to light. EOM are normal.  Neck: No thyromegaly present.  Cardiovascular: Normal rate, regular rhythm, normal heart sounds and intact distal pulses. Exam reveals no gallop and no friction rub.  No murmur heard. Pulmonary/Chest: Effort normal and breath sounds normal. She has no wheezes. She has no rales.  Abdominal: Soft. Bowel sounds are normal. She exhibits no distension and no mass. There is no abdominal tenderness.  Musculoskeletal:        General: No edema. Normal range of motion.     Cervical back: Neck supple.  Lymphadenopathy:    She has no cervical adenopathy.   Neurological: She is alert and oriented to person, place, and time. She has normal reflexes. Gait normal.  Skin: Skin is warm and dry.  Psychiatric: Mood and affect normal.  Nursing note and vitals reviewed.   Education/Counseling provided regarding diet and exercise, prevention of chronic diseases, smoking/tobacco cessation, if applicable, and reviewed "Covered Medicare Preventive Services."   ASSESSMENT/PLAN:  1. Encounter for Medicare annual wellness exam Routine HCM labs ordered. HCM reviewed/discussed. Anticipatory guidance regarding healthy weight, lifestyle and choices given.   2. Need for prophylactic vaccination and inoculation against influenza - Flu Vaccine QUAD High Dose(Fluad)  3. Pure hypercholesterolemia Checking labs today, medications will be adjusted as needed.  - TSH - Lipid panel - CMET with GFR  4. Essential hypertension, benign Controlled. Continue current regime.  - TSH - Lipid panel - CMET with GFR  Other orders - amLODipine (NORVASC) 10 MG tablet; Take 1 tablet (10 mg total) by mouth daily. - atorvastatin (LIPITOR) 20 MG tablet; Take 1 tablet (20 mg total) by mouth daily.  Return in about 6 months (around 09/29/2019).

## 2019-04-01 LAB — CMP14+EGFR
ALT: 40 IU/L — ABNORMAL HIGH (ref 0–32)
AST: 27 IU/L (ref 0–40)
Albumin/Globulin Ratio: 2.1 (ref 1.2–2.2)
Albumin: 4.9 g/dL — ABNORMAL HIGH (ref 3.7–4.7)
Alkaline Phosphatase: 92 IU/L (ref 39–117)
BUN/Creatinine Ratio: 18 (ref 12–28)
BUN: 18 mg/dL (ref 8–27)
Bilirubin Total: 0.5 mg/dL (ref 0.0–1.2)
CO2: 25 mmol/L (ref 20–29)
Calcium: 9.9 mg/dL (ref 8.7–10.3)
Chloride: 100 mmol/L (ref 96–106)
Creatinine, Ser: 1.02 mg/dL — ABNORMAL HIGH (ref 0.57–1.00)
GFR calc Af Amer: 64 mL/min/{1.73_m2} (ref >59)
GFR calc non Af Amer: 55 mL/min/{1.73_m2} — ABNORMAL LOW (ref >59)
Globulin, Total: 2.3 g/dL (ref 1.5–4.5)
Glucose: 104 mg/dL — ABNORMAL HIGH (ref 65–99)
Potassium: 4 mmol/L (ref 3.5–5.2)
Sodium: 140 mmol/L (ref 134–144)
Total Protein: 7.2 g/dL (ref 6.0–8.5)

## 2019-04-01 LAB — LIPID PANEL
Chol/HDL Ratio: 3.4 ratio (ref 0.0–4.4)
Cholesterol, Total: 182 mg/dL (ref 100–199)
HDL: 53 mg/dL (ref >39)
LDL Chol Calc (NIH): 105 mg/dL — ABNORMAL HIGH (ref 0–99)
Triglycerides: 134 mg/dL (ref 0–149)
VLDL Cholesterol Cal: 24 mg/dL (ref 5–40)

## 2019-04-01 LAB — TSH: TSH: 2.23 u[IU]/mL (ref 0.450–4.500)

## 2019-06-02 DIAGNOSIS — N3020 Other chronic cystitis without hematuria: Secondary | ICD-10-CM | POA: Diagnosis not present

## 2019-06-02 DIAGNOSIS — C678 Malignant neoplasm of overlapping sites of bladder: Secondary | ICD-10-CM | POA: Diagnosis not present

## 2019-06-03 ENCOUNTER — Other Ambulatory Visit: Payer: Self-pay | Admitting: Urology

## 2019-06-09 ENCOUNTER — Other Ambulatory Visit: Payer: Self-pay

## 2019-06-09 ENCOUNTER — Encounter (HOSPITAL_BASED_OUTPATIENT_CLINIC_OR_DEPARTMENT_OTHER): Payer: Self-pay | Admitting: Urology

## 2019-06-09 NOTE — Progress Notes (Signed)
Spoke w/ via phone for pre-op interview---[name] needs dos----     I stat 8, ekg         COVID test ------06-10-2019 Arrive at -------1145 am 06-13-2019 NPO after ------midnight food, clear liquids until 745 am then npo Medications to take morning of surgery -----amlodipine, atorbastatin Diabetic medication ----n/a- Patient Special Instruction Pre-Op special Istructions ----- Patient verbalized understanding of instructions that were given at this phone interview. Patient denies shortness of breath, chest pain, fever, cough a this phone interview.

## 2019-06-10 ENCOUNTER — Other Ambulatory Visit (HOSPITAL_COMMUNITY)
Admission: RE | Admit: 2019-06-10 | Discharge: 2019-06-10 | Disposition: A | Discharge status: Home / Self Care | Payer: Medicare Other | Source: Ambulatory Visit | Attending: Urology | Admitting: Urology

## 2019-06-10 DIAGNOSIS — Z01812 Encounter for preprocedural laboratory examination: Secondary | ICD-10-CM | POA: Insufficient documentation

## 2019-06-10 DIAGNOSIS — Z20822 Contact with and (suspected) exposure to covid-19: Secondary | ICD-10-CM | POA: Diagnosis not present

## 2019-06-10 LAB — SARS CORONAVIRUS 2 (TAT 6-24 HRS): SARS Coronavirus 2: NEGATIVE

## 2019-06-13 ENCOUNTER — Encounter (HOSPITAL_BASED_OUTPATIENT_CLINIC_OR_DEPARTMENT_OTHER)
Admission: RE | Disposition: A | Discharge status: Home / Self Care | Payer: Self-pay | Source: Home / Self Care | Attending: Urology

## 2019-06-13 ENCOUNTER — Encounter (HOSPITAL_BASED_OUTPATIENT_CLINIC_OR_DEPARTMENT_OTHER): Payer: Self-pay | Admitting: Urology

## 2019-06-13 ENCOUNTER — Ambulatory Visit (HOSPITAL_BASED_OUTPATIENT_CLINIC_OR_DEPARTMENT_OTHER): Payer: Medicare Other | Admitting: Anesthesiology

## 2019-06-13 ENCOUNTER — Other Ambulatory Visit: Payer: Self-pay

## 2019-06-13 ENCOUNTER — Ambulatory Visit (HOSPITAL_BASED_OUTPATIENT_CLINIC_OR_DEPARTMENT_OTHER)
Admission: RE | Admit: 2019-06-13 | Discharge: 2019-06-13 | Disposition: A | Discharge status: Home / Self Care | Payer: Medicare Other | Attending: Urology | Admitting: Urology

## 2019-06-13 ENCOUNTER — Other Ambulatory Visit (HOSPITAL_COMMUNITY): Payer: Medicare Other

## 2019-06-13 DIAGNOSIS — Z86010 Personal history of colonic polyps: Secondary | ICD-10-CM | POA: Insufficient documentation

## 2019-06-13 DIAGNOSIS — N1330 Unspecified hydronephrosis: Secondary | ICD-10-CM | POA: Diagnosis not present

## 2019-06-13 DIAGNOSIS — E7800 Pure hypercholesterolemia, unspecified: Secondary | ICD-10-CM | POA: Diagnosis not present

## 2019-06-13 DIAGNOSIS — E785 Hyperlipidemia, unspecified: Secondary | ICD-10-CM | POA: Insufficient documentation

## 2019-06-13 DIAGNOSIS — C679 Malignant neoplasm of bladder, unspecified: Secondary | ICD-10-CM | POA: Diagnosis not present

## 2019-06-13 DIAGNOSIS — Z885 Allergy status to narcotic agent status: Secondary | ICD-10-CM | POA: Insufficient documentation

## 2019-06-13 DIAGNOSIS — Z9889 Other specified postprocedural states: Secondary | ICD-10-CM | POA: Insufficient documentation

## 2019-06-13 DIAGNOSIS — C671 Malignant neoplasm of dome of bladder: Secondary | ICD-10-CM | POA: Insufficient documentation

## 2019-06-13 DIAGNOSIS — D090 Carcinoma in situ of bladder: Secondary | ICD-10-CM | POA: Diagnosis not present

## 2019-06-13 DIAGNOSIS — D4959 Neoplasm of unspecified behavior of other genitourinary organ: Secondary | ICD-10-CM | POA: Diagnosis not present

## 2019-06-13 DIAGNOSIS — Z87891 Personal history of nicotine dependence: Secondary | ICD-10-CM | POA: Insufficient documentation

## 2019-06-13 DIAGNOSIS — Z8249 Family history of ischemic heart disease and other diseases of the circulatory system: Secondary | ICD-10-CM | POA: Diagnosis not present

## 2019-06-13 DIAGNOSIS — C678 Malignant neoplasm of overlapping sites of bladder: Secondary | ICD-10-CM | POA: Diagnosis not present

## 2019-06-13 DIAGNOSIS — I10 Essential (primary) hypertension: Secondary | ICD-10-CM | POA: Diagnosis not present

## 2019-06-13 DIAGNOSIS — D4122 Neoplasm of uncertain behavior of left ureter: Secondary | ICD-10-CM | POA: Diagnosis not present

## 2019-06-13 HISTORY — PX: CYSTOSCOPY W/ RETROGRADES: SHX1426

## 2019-06-13 HISTORY — DX: Essential (primary) hypertension: I10

## 2019-06-13 HISTORY — PX: TRANSURETHRAL RESECTION OF BLADDER TUMOR: SHX2575

## 2019-06-13 LAB — POCT I-STAT, CHEM 8
BUN: 14 mg/dL (ref 8–23)
Calcium, Ion: 1.22 mmol/L (ref 1.15–1.40)
Chloride: 100 mmol/L (ref 98–111)
Creatinine, Ser: 0.9 mg/dL (ref 0.44–1.00)
Glucose, Bld: 91 mg/dL (ref 70–99)
HCT: 42 % (ref 36.0–46.0)
Hemoglobin: 14.3 g/dL (ref 12.0–15.0)
Potassium: 3.8 mmol/L (ref 3.5–5.1)
Sodium: 140 mmol/L (ref 135–145)
TCO2: 29 mmol/L (ref 22–32)

## 2019-06-13 SURGERY — TURBT (TRANSURETHRAL RESECTION OF BLADDER TUMOR)
Anesthesia: General | Site: Pelvis

## 2019-06-13 MED ORDER — LIDOCAINE 2% (20 MG/ML) 5 ML SYRINGE
INTRAMUSCULAR | Status: AC
  Filled 2019-06-13: qty 5

## 2019-06-13 MED ORDER — LIDOCAINE 2% (20 MG/ML) 5 ML SYRINGE
INTRAMUSCULAR | Status: DC | PRN
  Administered 2019-06-13: 60 mg via INTRAVENOUS

## 2019-06-13 MED ORDER — FENTANYL CITRATE (PF) 100 MCG/2ML IJ SOLN
INTRAMUSCULAR | Status: DC | PRN
  Administered 2019-06-13: 25 ug via INTRAVENOUS

## 2019-06-13 MED ORDER — PROPOFOL 10 MG/ML IV BOLUS
INTRAVENOUS | Status: DC | PRN
  Administered 2019-06-13: 160 mg via INTRAVENOUS

## 2019-06-13 MED ORDER — DEXAMETHASONE SODIUM PHOSPHATE 10 MG/ML IJ SOLN
INTRAMUSCULAR | Status: DC | PRN
  Administered 2019-06-13 (×2): 5 mg via INTRAVENOUS

## 2019-06-13 MED ORDER — ONDANSETRON HCL 4 MG/2ML IJ SOLN
4.0000 mg | Freq: Once | INTRAMUSCULAR | Status: DC | PRN
  Filled 2019-06-13: qty 2

## 2019-06-13 MED ORDER — IOHEXOL 300 MG/ML  SOLN
INTRAMUSCULAR | Status: DC | PRN
  Administered 2019-06-13: 30 mL via URETHRAL

## 2019-06-13 MED ORDER — ONDANSETRON HCL 4 MG/2ML IJ SOLN
INTRAMUSCULAR | Status: DC | PRN
  Administered 2019-06-13: 4 mg via INTRAVENOUS

## 2019-06-13 MED ORDER — SODIUM CHLORIDE 0.9 % IR SOLN
Status: DC | PRN
  Administered 2019-06-13: 3000 mL via INTRAVESICAL

## 2019-06-13 MED ORDER — FENTANYL CITRATE (PF) 100 MCG/2ML IJ SOLN
25.0000 ug | INTRAMUSCULAR | Status: DC | PRN
  Filled 2019-06-13: qty 1

## 2019-06-13 MED ORDER — EPHEDRINE 5 MG/ML INJ
INTRAVENOUS | Status: AC
  Filled 2019-06-13: qty 10

## 2019-06-13 MED ORDER — EPHEDRINE SULFATE-NACL 50-0.9 MG/10ML-% IV SOSY
PREFILLED_SYRINGE | INTRAVENOUS | Status: DC | PRN
  Administered 2019-06-13: 10 mg via INTRAVENOUS

## 2019-06-13 MED ORDER — LACTATED RINGERS IV SOLN
INTRAVENOUS | Status: DC
  Filled 2019-06-13: qty 1000

## 2019-06-13 MED ORDER — PROPOFOL 10 MG/ML IV BOLUS
INTRAVENOUS | Status: AC
  Filled 2019-06-13: qty 20

## 2019-06-13 MED ORDER — TRAMADOL HCL 50 MG PO TABS: 50.0000 mg | ORAL_TABLET | Freq: Four times a day (QID) | ORAL | 0 refills | Status: DC | PRN

## 2019-06-13 MED ORDER — DEXAMETHASONE SODIUM PHOSPHATE 10 MG/ML IJ SOLN
INTRAMUSCULAR | Status: AC
  Filled 2019-06-13: qty 1

## 2019-06-13 MED ORDER — GENTAMICIN SULFATE 40 MG/ML IJ SOLN
5.0000 mg/kg | INTRAVENOUS | Status: AC
  Administered 2019-06-13: 500 mg via INTRAVENOUS
  Filled 2019-06-13 (×2): qty 12.5

## 2019-06-13 MED ORDER — ONDANSETRON HCL 4 MG/2ML IJ SOLN
INTRAMUSCULAR | Status: AC
  Filled 2019-06-13: qty 2

## 2019-06-13 MED ORDER — FENTANYL CITRATE (PF) 100 MCG/2ML IJ SOLN
INTRAMUSCULAR | Status: AC
  Filled 2019-06-13: qty 2

## 2019-06-13 SURGICAL SUPPLY — 29 items
BAG DRAIN URO-CYSTO SKYTR STRL (DRAIN) ×3 IMPLANT
BAG URINE DRAIN 2000ML AR STRL (UROLOGICAL SUPPLIES) IMPLANT
BAG URINE LEG 500ML (DRAIN) IMPLANT
CATH FOLEY 2WAY SLVR  5CC 22FR (CATHETERS)
CATH FOLEY 2WAY SLVR 30CC 20FR (CATHETERS) IMPLANT
CATH FOLEY 2WAY SLVR 5CC 22FR (CATHETERS) IMPLANT
CATH INTERMIT  6FR 70CM (CATHETERS) ×3 IMPLANT
CLOTH BEACON ORANGE TIMEOUT ST (SAFETY) ×3 IMPLANT
ELECT REM PT RETURN 9FT ADLT (ELECTROSURGICAL)
ELECTRODE REM PT RTRN 9FT ADLT (ELECTROSURGICAL) IMPLANT
EVACUATOR MICROVAS BLADDER (UROLOGICAL SUPPLIES) IMPLANT
GLOVE BIO SURGEON STRL SZ7.5 (GLOVE) ×3 IMPLANT
GOWN STRL REUS W/ TWL LRG LVL3 (GOWN DISPOSABLE) ×2 IMPLANT
GOWN STRL REUS W/TWL LRG LVL3 (GOWN DISPOSABLE) ×6 IMPLANT
GUIDEWIRE ANG ZIPWIRE 038X150 (WIRE) ×3 IMPLANT
GUIDEWIRE STR DUAL SENSOR (WIRE) ×3 IMPLANT
IV NS 1000ML (IV SOLUTION) ×3
IV NS 1000ML BAXH (IV SOLUTION) ×2 IMPLANT
IV NS IRRIG 3000ML ARTHROMATIC (IV SOLUTION) ×3 IMPLANT
KIT TURNOVER CYSTO (KITS) ×3 IMPLANT
LOOP CUT BIPOLAR 24F LRG (ELECTROSURGICAL) ×3 IMPLANT
MANIFOLD NEPTUNE II (INSTRUMENTS) ×3 IMPLANT
NS IRRIG 500ML POUR BTL (IV SOLUTION) ×3 IMPLANT
PACK CYSTO (CUSTOM PROCEDURE TRAY) ×3 IMPLANT
STENT POLARIS 5FRX26 (STENTS) ×3 IMPLANT
SYR 10ML LL (SYRINGE) ×3 IMPLANT
SYR TOOMEY IRRIG 70ML (MISCELLANEOUS)
SYRINGE TOOMEY IRRIG 70ML (MISCELLANEOUS) IMPLANT
TUBE CONNECTING 12X1/4 (SUCTIONS) ×3 IMPLANT

## 2019-06-13 NOTE — H&P (Signed)
[name] is an 74 y.o. female.    Chief Complaint: Pre-OP Cysto, Retrogrades, TURBT  HPI:   1 - Recurrent Low Grade Bladder Cancer - Massive volume multifocal bladder cancer by CT and cysto 2015 on eval for persistant dysuria and hematuria. Large trigone and left wall components. No hydro or pelvic adenopathy by CT Urogram.    Summarized Recent Course:   10/2013 - TaG1 with muscle in specimen by massive volume TURBT; 01/2014 reTURBT TaG1  06/2014 multifocal small recurrence --> 07/2014 TURBT + Mito C T1G1 with muscle in specimen --> induction BCG x6  11/2016 cysto multifocal small volume recurrence --> TaG1 by TURBT / Retrogrades; 02/2017 Surv Cysto NED  06/2017 - cysto NED; 09/2017 cysto NED; 09/2017 cysto NED  06/2018 - cysto - no recurrence; 12/2018 cysto - no recurrence (? dome very very early papillary changes)  05/2019 - cysto - 1cm dome, 54mm Rt lateral trigone, posterior flat recurrence.    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    PMH sig for TNA, foot surgery. NO CV disease. NO strong blood thinners. NO chest or abdominal surgeries. She gets primary care through [facility] on [address]    Today "[name]" is seen to proceed with cysto, retrogardes, TURBT for small volume recurrent bladder cance.r Mots recetn UA without infectious parameters. C19 screen negative.      Past Medical History:  Diagnosis Date  . Bladder cancer ([place]) [place]  . Duplex kidney   . Duplex kidney    RIGHT  . History of adenomatous polyp of colon   . History of recurrent UTIs   . Hyperlipidemia   . Hypertension   . Wears contact lenses     Past Surgical History:  Procedure Laterality Date  . BENIGN BREAST BX Right 1995  . BUNIONECTOMY Right 2000  . bunionectomy left foot  04/2016  . COLONOSCOPY W/ POLYPECTOMY  01/06/2013    single sessile polyp ascending colon.  [facility].  Marland Kitchen CYSTOSCOPY W/ RETROGRADES Bilateral 12/10/2013   Procedure: RETROGRADE PYELOGRAM  ;  Surgeon: Alexis Frock, MD;  [facility];  Service: Urology;  Laterality: Bilateral;  . CYSTOSCOPY W/ RETROGRADES Bilateral 07/15/2014   Procedure: CYSTOSCOPY WITH RETROGRADE PYELOGRAM AND INSTILLATION OF MITOMYCIN C;  Surgeon: Alexis Frock, MD;  Location: [facility];  Service: Urology;  Laterality: Bilateral;  . CYSTOSCOPY W/ RETROGRADES Bilateral 12/31/2015   Procedure: CYSTOSCOPY WITH RETROGRADE PYELOGRAM;  Surgeon: Alexis Frock, MD;  Location: [facility];  Service: Urology;  Laterality: Bilateral;  . CYSTOSCOPY W/ RETROGRADES Bilateral 11/17/2016   Procedure: CYSTOSCOPY WITH RETROGRADE PYELOGRAM;  Surgeon: Alexis Frock, MD;  Location: [facility];  Service: Urology;  Laterality: Bilateral;  . CYSTOSCOPY W/ URETERAL STENT PLACEMENT Bilateral 10/22/2013   Procedure: CYSTOSCOPY WITH RETROGRADE PYELOGRAM;  Surgeon: Alexis Frock, MD;  Location: [facility];  Service: Urology;  Laterality: Bilateral;  . CYSTOSCOPY WITH RETROGRADE PYELOGRAM, URETEROSCOPY AND STENT PLACEMENT Bilateral 04/14/2015   Procedure: CYSTOSCOPY WITH RETROGRADE PYELOGRAM, URETEROSCOPY AND RIGHT STENT PLACEMENT;  Surgeon: Alexis Frock, MD;  Location: [facility];  Service: Urology;  Laterality: Bilateral;  . TONSILLECTOMY  4   (age 30)  . TRANSURETHRAL RESECTION OF BLADDER TUMOR N/A 12/31/2015   Procedure: TRANSURETHRAL RESECTION OF BLADDER TUMOR (TURBT) WITH GYRUS;  Surgeon: Alexis Frock, MD;  Location: [facility];  Service: Urology;  Laterality: N/A;  . TRANSURETHRAL RESECTION OF BLADDER TUMOR N/A 11/17/2016   Procedure: TRANSURETHRAL RESECTION OF BLADDER TUMOR (TURBT);  Surgeon: Alexis Frock, MD;  Location: [facility];  Service: Urology;  Laterality:  N/A;  . TRANSURETHRAL RESECTION OF BLADDER TUMOR WITH GYRUS (TURBT-GYRUS) N/A 10/22/2013   Procedure: TRANSURETHRAL RESECTION OF BLADDER TUMOR WITH ERBE.;  Surgeon: Alexis Frock, MD;  Location: [facility];  Service: Urology;  Laterality: N/A;  . TRANSURETHRAL RESECTION OF BLADDER TUMOR WITH GYRUS (TURBT-GYRUS) N/A 12/10/2013   Procedure: RESTAGING TRANSURETHRAL RESECTION OF BLADDER TUMOR WITH GYRUS (TURBT-GYRUS);  Surgeon: Alexis Frock, MD;  Location: [facility];  Service: Urology;  Laterality: N/A;  . TRANSURETHRAL RESECTION OF BLADDER TUMOR WITH GYRUS (TURBT-GYRUS) N/A 07/15/2014   Procedure: TRANSURETHRAL RESECTION OF BLADDER TUMOR WITH GYRUS (TURBT-GYRUS);  Surgeon: Alexis Frock, MD;  Location: [facility];  Service: Urology;  Laterality: N/A;  . TRANSURETHRAL RESECTION OF BLADDER TUMOR WITH GYRUS (TURBT-GYRUS) N/A 04/14/2015   Procedure: TRANSURETHRAL RESECTION OF BLADDER TUMOR WITH GYRUS (TURBT-GYRUS);  Surgeon: Alexis Frock, MD;  Location: [facility];  Service: Urology;  Laterality: N/A;    Family History  Problem Relation Age of Onset  . Hypertension Mother   . Arthritis Mother        knee replacement; shoulder replacement  . Heart disease Mother        AAA; mild AMI in 2018  . Stroke Mother 7       TIA  . Cancer Father        prostate cancer  . Cancer Brother        prostate cancer  . Cancer Brother 50       Melanoma  . Mental illness Sister 47       bipolar disorder   Social History:  reports that she quit smoking about 6 years ago. Her smoking use included cigarettes. She has a 3.00 pack-year smoking history. She has never used smokeless tobacco. She reports current alcohol use. She reports that she does not use drugs.  Allergies:  Allergies  Allergen Reactions  . Hycodan [Hydrocodone-Homatropine] Hives  . Other     Unknown cough syrup with codeine in it hives    No medications prior to admission.     No results found for this or any previous visit (from the past 48 hour(s)). No results found.  Review of Systems  Constitutional: Negative.  Negative for chills and fever.  HENT: Negative.   Eyes: Negative.   Respiratory: Negative.   Cardiovascular: Negative.   Gastrointestinal: Negative.   Endocrine: Negative.   Genitourinary: Negative.   Musculoskeletal: Negative.   Allergic/Immunologic: Negative.   Neurological: Negative.   Psychiatric/Behavioral: Negative.     Height 5\' 9"  (1.753 m), weight 97.1 kg. Physical Exam  Constitutional: She appears well-developed.  HENT:  Head: Normocephalic.  Eyes: Pupils are equal, round, and reactive to light.  Cardiovascular: Normal rate.  Respiratory: Effort normal.  GI: Soft.  Genitourinary:    Genitourinary Comments: NO CVAT at present.    Musculoskeletal:     Cervical back: Normal range of motion.  Neurological: She is alert.  Skin: Skin is warm.  Psychiatric: She has a normal mood and affect.     Assessment/Plan  Proceed as planned with cysto, bilateral retrogrades, and TURBT. Risks, benefits, alternatives, expected peri-op course, discussed previously and reiterated today.   Alexis Frock,  MD 06/13/2019, 7:06 AM

## 2019-06-13 NOTE — Transfer of Care (Signed)
Immediate Anesthesia Transfer of Care Note  Patient: [name]  Procedure(s) Performed: Procedure(s) (LRB): TRANSURETHRAL RESECTION OF BLADDER TUMOR (TURBT). LEFT DIAGNOSTIC URETEROSCOPY AND LEFT STENT PLACEMENT (N/A) CYSTOSCOPY WITH RETROGRADE PYELOGRAM (Bilateral)  Patient Location: PACU  Anesthesia Type: General  Level of Consciousness: awake, oriented, sedated and patient cooperative  Airway & Oxygen Therapy: Patient Spontanous Breathing and Patient connected to face mask oxygen  Post-op Assessment: Report given to PACU RN and Post -op Vital signs reviewed and stable  Post vital signs: Reviewed and stable  Complications: No apparent anesthesia complications Last Vitals:  Vitals Value Taken Time  BP 131/79 06/13/19 1400  Temp 36.4 C 06/13/19 1355  Pulse 73 06/13/19 1403  Resp 16 06/13/19 1403  SpO2 100 % 06/13/19 1403  Vitals shown include unvalidated device data.  Last Pain:  Vitals:   06/13/19 1355  TempSrc:   PainSc: 0-No pain

## 2019-06-13 NOTE — Anesthesia Preprocedure Evaluation (Addendum)
Anesthesia Evaluation  Patient identified by MRN, date of birth, ID band Patient awake    Reviewed: Allergy & Precautions, NPO status , Patient's Chart, lab work & pertinent test results  History of Anesthesia Complications Negative for: history of anesthetic complications  Airway Mallampati: II  TM Distance: >3 FB Neck ROM: Full    Dental  (+) Dental Advisory Given, Teeth Intact   Pulmonary former smoker,    Pulmonary exam normal        Cardiovascular hypertension, Pt. on medications Normal cardiovascular exam     Neuro/Psych negative neurological ROS  negative psych ROS   GI/Hepatic negative GI ROS, Neg liver ROS,   Endo/Other   Obesity   Renal/GU negative Renal ROS    Bladder tumor     Musculoskeletal negative musculoskeletal ROS (+)   Abdominal   Peds  Hematology negative hematology ROS (+)   Anesthesia Other Findings Covid neg 06/10/19   Reproductive/Obstetrics                            Anesthesia Physical Anesthesia Plan  ASA: II  Anesthesia Plan: General   Post-op Pain Management:    Induction: Intravenous  PONV Risk Score and Plan: 4 or greater and Treatment may vary due to age or medical condition, Ondansetron and Dexamethasone  Airway Management Planned: LMA  Additional Equipment: None  Intra-op Plan:   Post-operative Plan: Extubation in OR  Informed Consent: I have reviewed the patients History and Physical, chart, labs and discussed the procedure including the risks, benefits and alternatives for the proposed anesthesia with the patient or authorized representative who has indicated his/her understanding and acceptance.     Dental advisory given  Plan Discussed with: CRNA and Anesthesiologist  Anesthesia Plan Comments:        Anesthesia Quick Evaluation

## 2019-06-13 NOTE — Discharge Instructions (Signed)
1 - You may have urinary urgency (bladder spasms) and bloody urine on / off with stent in place. This is normal. ° °2 - Call MD or go to ER for fever >102, severe pain / nausea / vomiting not relieved by medications, or acute change in medical status °[facility] °[phone] °Post Ureteroscopy With or Without Stent Instructions ° °Definitions: ° °Ureter: The duct that transports urine from the kidney to the bladder. °Stent:   A plastic hollow tube that is placed into the ureter, from the kidney to the                 bladder to prevent the ureter from swelling shut. ° °GENERAL INSTRUCTIONS: ° °Despite the fact that no skin incisions were used, the area around the ureter and bladder is raw and irritated. The stent is a foreign body which will further irritate the bladder wall. This irritation is manifested by increased frequency of urination, both day and night, and by an increase in the urge to urinate. In some, the urge to urinate is present almost always. Sometimes the urge is strong enough that you may not be able to stop yourself from urinating. The only real cure is to remove the stent and then give time for the bladder wall to heal which can't be done until the danger of the ureter swelling shut has passed, which varies. ° °You may see some blood in your urine while the stent is in place and a few days afterwards. Do not be alarmed, even if the urine was clear for a while. Get off your feet and drink lots of fluids until clearing occurs. If you start to pass clots or don't improve, call us. ° °DIET: °You may return to your normal diet immediately. Because of the raw surface of your bladder, alcohol, spicy foods, acid type foods and drinks with caffeine may cause irritation or frequency and should be used in moderation. To keep your urine flowing freely and to avoid constipation, drink plenty of fluids during the day ( 8-10 glasses ). °Tip: Avoid cranberry juice because it is very  acidic. ° °ACTIVITY: °Your physical activity doesn't need to be restricted. However, if you are very active, you may see some blood in your urine. We suggest that you reduce your activity under these circumstances until the bleeding has stopped. ° °BOWELS: °It is important to keep your bowels regular during the postoperative period. Straining with bowel movements can cause bleeding. A bowel movement every other day is reasonable. Use a mild laxative if needed, such as Milk of Magnesia 2-3 tablespoons, or 2 Dulcolax tablets. Call if you continue to have problems. If you have been taking narcotics for pain, before, during or after your surgery, you may be constipated. Take a laxative if necessary. ° ° °MEDICATION: °You should resume your pre-surgery medications unless told not to. In addition you will often be given an antibiotic to prevent infection. These should be taken as prescribed until the bottles are finished unless you are having an unusual reaction to one of the drugs. ° °PROBLEMS YOU SHOULD REPORT TO US: °· Fevers over 100.5 Fahrenheit. °· Heavy bleeding, or clots ( See above notes about blood in urine ). °· Inability to urinate. °· Drug reactions ( hives, rash, nausea, vomiting, diarrhea ). °· Severe burning or pain with urination that is not improving. ° °FOLLOW-UP: °You will need a follow-up appointment to monitor your progress. Call for this appointment at the number listed above.   Usually the first appointment will be about three to fourteen days after your surgery. ° ° ° ° ° °Post Anesthesia Home Care Instructions ° °Activity: °Get plenty of rest for the remainder of the day. A responsible adult should stay with you for 24 hours following the procedure.  °For the next 24 hours, DO NOT: °-Drive a car °-Operate machinery °-Drink alcoholic beverages °-Take any medication unless instructed by your physician °-Make any legal decisions or sign important papers. ° °Meals: °Start with liquid foods such as  gelatin or soup. Progress to regular foods as tolerated. Avoid greasy, spicy, heavy foods. If nausea and/or vomiting occur, drink only clear liquids until the nausea and/or vomiting subsides. Call your physician if vomiting continues. ° °Special Instructions/Symptoms: °Your throat may feel dry or sore from the anesthesia or the breathing tube placed in your throat during surgery. If this causes discomfort, gargle with warm salt water. The discomfort should disappear within 24 hours. ° °If you had a scopolamine patch placed behind your ear for the management of post- operative nausea and/or vomiting: ° °1. The medication in the patch is effective for 72 hours, after which it should be removed.  Wrap patch in a tissue and discard in the trash. Wash hands thoroughly with soap and water. °2. You may remove the patch earlier than 72 hours if you experience unpleasant side effects which may include dry mouth, dizziness or visual disturbances. °3. Avoid touching the patch. Wash your hands with soap and water after contact with the patch. °  ° °

## 2019-06-13 NOTE — Brief Op Note (Signed)
06/13/2019  1:46 PM  PATIENT:  [name]  74 y.o. 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 LEFT DIAGNOSTIC URETEROSCOPY AND LEFT STENT (N/A) - 45 MINS CYSTOSCOPY WITH RETROGRADE PYELOGRAM (Bilateral)  SURGEON:  Surgeon(s) and Role:    * Alexis Frock, MD - Primary  PHYSICIAN ASSISTANT:   ASSISTANTS: none   ANESTHESIA:   general  EBL:  minimal   BLOOD ADMINISTERED:none  DRAINS: none   LOCAL MEDICATIONS USED:  NONE  SPECIMEN:  Source of Specimen:  recurrent bladder tumor  DISPOSITION OF SPECIMEN:  pathology  COUNTS:  YES  TOURNIQUET:  * No tourniquets in log *  DICTATION: .Other Dictation: Dictation Number [id number]  PLAN OF CARE: Discharge to home after PACU  PATIENT DISPOSITION:  PACU - hemodynamically stable.   Delay start of Pharmacological VTE agent (>24hrs) due to surgical blood loss or risk of bleeding: yes

## 2019-06-13 NOTE — Anesthesia Procedure Notes (Signed)
Procedure Name: LMA Insertion Date/Time: 06/13/2019 1:15 PM Performed by: Suan Halter, CRNA Pre-anesthesia Checklist: Patient identified, Emergency Drugs available, Suction available and Patient being monitored Patient Re-evaluated:Patient Re-evaluated prior to induction Oxygen Delivery Method: Circle system utilized Preoxygenation: Pre-oxygenation with 100% oxygen Induction Type: IV induction Ventilation: Mask ventilation without difficulty LMA: LMA inserted LMA Size: 4.0 Number of attempts: 1 Airway Equipment and Method: Bite block Placement Confirmation: positive ETCO2 Tube secured with: Tape Dental Injury: Teeth and Oropharynx as per pre-operative assessment

## 2019-06-14 ENCOUNTER — Other Ambulatory Visit: Payer: Self-pay | Admitting: Family Medicine

## 2019-06-14 NOTE — Op Note (Signed)
NAME: [name], SOJA MEDICAL RECORD [id number] ACCOUNT [id number] DATE OF BIRTH:07-22-1945 FACILITY: [facility] LOCATION: [facility] PHYSICIAN:Tabetha Haraway Tresa Moore, MD  OPERATIVE REPORT  DATE OF PROCEDURE:  06/13/2019  SURGEON:  Alexis Frock, MD  PREOPERATIVE DIAGNOSIS:  Recurrent bladder cancer.  POSTOPERATIVE DIAGNOSES:   1.  Recurrent bladder cancer. 2.  Left distal ureteral tumor.  PROCEDURE: 1.  Cystoscopy, bilateral retrograde pyelograms and interpretation. 2.  Left diagnostic ureteroscopy. 3.  Insertion of left ureteral stent, 5 x 26 Polaris, no tether. 4.  Resection of bladder tumor, volume small.  ESTIMATED BLOOD LOSS:  Nil.  MEDICATIONS:  None.  SPECIMENS:   Bladder tumor fragments for permanent pathology.  FINDINGS: 1.  Multifocal small volume recurrent bladder tumor as expected.  Total volume approximately 2 cm. 2.  Left distal ureteral tumor involving the distal 3 cm of the left ureter circumferentially. 3.  No additional tumor within the left ureter or renal pelvis.  INDICATIONS:  The patient is a very pleasant 74 year old woman with a long history of frequently recurrent low-grade bladder cancer.  She is status post resection many times.  She is very compliant with surveillance.  She was found on most recent  surveillance cystoscopy to have multifocal, but small volume recurrence of suspected low-grade bladder cancer.  Options were discussed including continued surveillance versus proceeding with transurethral resection and she wished to with the latter.   Informed consent was obtained and placed in the medical record.  DESCRIPTION OF PROCEDURE:  The patient being identified, the procedure being cystoscopy with retrogrades, transection of bladder tumor was confirmed.  Procedure timeout was performed.  Intravenous antibiotics administered.  General LMA anesthesia  induced.  The patient was placed into a low lithotomy position.  A sterile field was created,  prepping and draping the patient's vagina, introitus and proximal thighs using iodine.  Cystourethroscopy was performed using a 21-French rigid cystoscope with  offset lens.  Inspection of urinary bladder revealed 2 dominant foci of papillary tumor, 1 at the dome and another at the right wall.  Each one of these were approximately marble size.  Total volume 2 cm.  There were some very subtle papillary changes  near the left ureteral orifice.  Attention was then directed at left retrograde pyelogram.  The left ureteral orifice was cannulated with a 6-French renal catheter and upon cannulation, it was immediately apparent that there was papillary tumor emanating  from the left ureteral orifice.  Left retrograde pyelogram was then obtained.  Left retrograde pyelogram demonstrated some mild hydronephrosis to the distal intramural ureter, likely consistent with tumor in this location.  A 0.03 ZIPwire was advanced to the level of the lower pole and set aside as a safety wire.  A right  retrograde pyelogram was then obtained.  Right retrograde pyelogram demonstrated an unremarkable ureter and collecting system.  Next, a semirigid ureteroscopy was performed of the distal left ureter alongside a separate sensor working wire.  As expected, there was papillary tumor within the  intramural ureter on the left side.  This extended approximately 3 cm.  This was quite circumferential.  Photodocumentation performed.  This was not amenable to simple snaring or ablation.  The semirigid scope was then exchanged over the sensor working  wire using fluoroscopic guidance for a single-channel digital ureteroscope and the entire left kidney was inspected, including all calices x2 and the entire length of the left ureter.  There were no additional foci of tumor noted within the left ureter.   A new 5 x  58 Polaris-type stent was then placed over the ZIPwire using fluoroscopic guidance.  Good proximal and distal plane were noted.   Next, using a 26-French resectoscope sheath with visual obturator and medium size loop, transurethral resection  was performed with the 2 foci of bladder tumor down to superficial fibromuscular stroma of the bladder.  Tumor fragments set aside for pathologic analysis.  The base of these areas was fulgurated.  The procedure was then terminated.  The patient  tolerated the procedure well.  No immediate perioperative complications.  The patient was taken to the postanesthesia care unit in stable condition.  Plan for discharge home.  VN/NUANCE  D:06/13/2019 T:06/13/2019 JOB:[id number]

## 2019-06-16 LAB — SURGICAL PATHOLOGY

## 2019-06-16 NOTE — Anesthesia Postprocedure Evaluation (Signed)
Anesthesia Post Note  Patient: KYISHA DENHERDER  Procedure(s) Performed: TRANSURETHRAL RESECTION OF BLADDER TUMOR (TURBT). LEFT DIAGNOSTIC URETEROSCOPY AND LEFT STENT PLACEMENT (N/A Pelvis) CYSTOSCOPY WITH RETROGRADE PYELOGRAM (Bilateral Pelvis)     Patient location during evaluation: PACU Anesthesia Type: General Level of consciousness: awake and alert Pain management: pain level controlled Vital Signs Assessment: post-procedure vital signs reviewed and stable Respiratory status: spontaneous breathing, nonlabored ventilation and respiratory function stable Cardiovascular status: blood pressure returned to baseline and stable Postop Assessment: no apparent nausea or vomiting Anesthetic complications: no    Last Vitals:  Vitals:   06/13/19 1430 06/13/19 1500  BP: 135/82 (!) 142/71  Pulse: 74 85  Resp: 18 16  Temp:  36.4 C  SpO2: 95% 96%    Last Pain:  Vitals:   06/13/19 1500  TempSrc:   PainSc: 0-No pain                 Audry Pili

## 2019-06-26 DIAGNOSIS — Z1159 Encounter for screening for other viral diseases: Secondary | ICD-10-CM | POA: Diagnosis not present

## 2019-07-01 DIAGNOSIS — Z86010 Personal history of colonic polyps: Secondary | ICD-10-CM | POA: Diagnosis not present

## 2019-07-08 DIAGNOSIS — N281 Cyst of kidney, acquired: Secondary | ICD-10-CM | POA: Diagnosis not present

## 2019-07-08 DIAGNOSIS — C678 Malignant neoplasm of overlapping sites of bladder: Secondary | ICD-10-CM | POA: Diagnosis not present

## 2019-07-09 ENCOUNTER — Other Ambulatory Visit: Payer: Self-pay | Admitting: Urology

## 2019-07-23 LAB — HM COLONOSCOPY

## 2019-08-07 NOTE — Telephone Encounter (Signed)
No action needed

## 2019-09-09 ENCOUNTER — Other Ambulatory Visit: Payer: Self-pay | Admitting: Family Medicine

## 2019-09-18 NOTE — Patient Instructions (Addendum)
DUE TO COVID-19 ONLY ONE VISITOR IS ALLOWED TO COME WITH YOU AND STAY IN THE WAITING ROOM ONLY DURING PRE OP AND PROCEDURE DAY OF SURGERY. THE 2 VISITORS  MAY VISIT WITH YOU AFTER SURGERY IN YOUR PRIVATE ROOM DURING VISITING HOURS ONLY!  YOU NEED TO HAVE A COVID 19 TEST ON   6/22__ @__10 :15_____, THIS TEST MUST BE DONE BEFORE SURGERY, COME  [place] , [id number].  ([place]) ONCE YOUR COVID TEST IS COMPLETED, PLEASE BEGIN THE QUARANTINE INSTRUCTIONS AS OUTLINED IN YOUR HANDOUT.                [name]    Your procedure is scheduled on: 10/03/19   Report to [facility] Main  Entrance   Report to admitting at  9:00  AM     Call this number if you have problems the morning of surgery [phone]    Remember: Do not eat food or drink liquids :After Midnight.   BRUSH YOUR TEETH MORNING OF SURGERY AND RINSE YOUR MOUTH OUT, NO CHEWING GUM CANDY OR MINTS.     Take these medicines the morning of surgery with A SIP OF WATER: Amlodipine                                You may not have any metal on your body including hair pins and              piercings  Do not wear jewelry, make-up, lotions, powders or perfumes, deodorant             Do not wear nail polish on your fingernails.             Do not shave  48 hours prior to surgery.             Do not bring valuables to the hospital. [place].  Contacts, dentures or bridgework may not be worn into surgery.     Name and phone number of your driver:  Special Instructions: N/A              Please read over the following fact sheets you were given: _____________________________________________________________________             [facility] - Preparing for Surgery  Before surgery, you can play an important role   Because skin is not sterile, your skin needs to be as free of germs as possible.   You can reduce the number of germs  on your skin by washing with CHG (chlorahexidine gluconate) soap before surgery.   CHG is an antiseptic cleaner which kills germs and bonds with the skin to continue killing germs even after washing. Please DO NOT use if you have an allergy to CHG or antibacterial soaps.   If your skin becomes reddened/irritated stop using the CHG and inform your nurse when you arrive at Short Stay. Do not shave (including legs and underarms) for at least 48 hours prior to the first CHG shower.    Please follow these instructions carefully:  1.  Shower with CHG Soap the night before surgery and the  morning of Surgery.  2.  If you choose to wash your hair, wash your hair first as usual with your  normal  shampoo.  3.  After you shampoo, rinse your hair and body thoroughly to remove the  shampoo.                                        4.  Use CHG as you would any other liquid soap.  You can apply chg directly  to the skin and wash                       Gently with a scrungie or clean washcloth.  5.  Apply the CHG Soap to your body ONLY FROM THE NECK DOWN.   Do not use on face/ open                           Wound or open sores. Avoid contact with eyes, ears mouth and genitals (private parts).                       Wash face,  Genitals (private parts) with your normal soap.             6.  Wash thoroughly, paying special attention to the area where your surgery  will be performed.  7.  Thoroughly rinse your body with warm water from the neck down.  8.  DO NOT shower/wash with your normal soap after using and rinsing off  the CHG Soap.             9.  Pat yourself dry with a clean towel.            10.  Wear clean pajamas.            11.  Place clean sheets on your bed the night of your first shower and do not  sleep with pets.  Day of Surgery : Do not apply any lotions/deodorants the morning of surgery.  Please wear clean clothes to the hospital/surgery center.  FAILURE TO FOLLOW THESE INSTRUCTIONS MAY RESULT  IN THE CANCELLATION OF YOUR SURGERY PATIENT SIGNATURE_________________________________  NURSE SIGNATURE__________________________________  ________________________________________________________________________

## 2019-09-19 ENCOUNTER — Encounter (HOSPITAL_COMMUNITY)
Admission: RE | Admit: 2019-09-19 | Discharge: 2019-09-19 | Disposition: A | Discharge status: Home / Self Care | Payer: Medicare Other | Source: Ambulatory Visit | Attending: Urology | Admitting: Urology

## 2019-09-19 ENCOUNTER — Encounter (HOSPITAL_COMMUNITY): Payer: Self-pay

## 2019-09-19 ENCOUNTER — Other Ambulatory Visit: Payer: Self-pay

## 2019-09-19 NOTE — Progress Notes (Signed)
COVID Vaccine Completed:yes Date COVID Vaccine completed:01/19/19- Pt is in a study for Covid COVID vaccine manufacturer: [employer]      PCP - Dr. Macarthur Critchley Cardiologist - no  Chest x-ray - no EKG - 06/13/19 Stress Test - no ECHO - no Cardiac Cath - no  Sleep Study - no CPAP -   Fasting Blood Sugar - NA Checks Blood Sugar _____ times a day  Blood Thinner Instructions:NA Aspirin Instructions: Last Dose:  Anesthesia review:   Patient denies shortness of breath, fever, cough and chest pain at PAT appointment  yes Patient verbalized understanding of instructions that were given to them at the PAT appointment. Patient was also instructed that they will need to review over the PAT instructions again at home before surgery. Yes

## 2019-09-22 ENCOUNTER — Encounter: Payer: Self-pay | Admitting: Family Medicine

## 2019-09-22 ENCOUNTER — Telehealth: Payer: Self-pay | Admitting: Family Medicine

## 2019-09-22 ENCOUNTER — Telehealth (INDEPENDENT_AMBULATORY_CARE_PROVIDER_SITE_OTHER): Payer: Medicare Other | Admitting: Family Medicine

## 2019-09-22 ENCOUNTER — Other Ambulatory Visit: Payer: Self-pay

## 2019-09-22 DIAGNOSIS — R059 Cough, unspecified: Secondary | ICD-10-CM

## 2019-09-22 DIAGNOSIS — E7800 Pure hypercholesterolemia, unspecified: Secondary | ICD-10-CM

## 2019-09-22 DIAGNOSIS — I10 Essential (primary) hypertension: Secondary | ICD-10-CM | POA: Diagnosis not present

## 2019-09-22 DIAGNOSIS — C679 Malignant neoplasm of bladder, unspecified: Secondary | ICD-10-CM | POA: Diagnosis not present

## 2019-09-22 DIAGNOSIS — R05 Cough: Secondary | ICD-10-CM | POA: Diagnosis not present

## 2019-09-22 MED ORDER — TRIAMTERENE-HCTZ 37.5-25 MG PO TABS: 1.0000 | ORAL_TABLET | Freq: Every day | ORAL | 0 refills | Status: DC

## 2019-09-22 MED ORDER — ATORVASTATIN CALCIUM 20 MG PO TABS: 20.0000 mg | ORAL_TABLET | Freq: Every day | ORAL | 3 refills | Status: DC

## 2019-09-22 MED ORDER — AMLODIPINE BESYLATE 10 MG PO TABS: 10.0000 mg | ORAL_TABLET | Freq: Every day | ORAL | 3 refills | Status: DC

## 2019-09-22 NOTE — Patient Instructions (Signed)
     If you have lab work done today you will be contacted with your lab results within the next 2 weeks.  If you have not heard from us then please contact us. The fastest way to get your results is to register for My Chart.   IF you received an x-ray today, you will receive an invoice from [facility]. Please contact [facility] at [phone] with questions or concerns regarding your invoice.   IF you received labwork today, you will receive an invoice from [facility]. Please contact [facility] at [phone] with questions or concerns regarding your invoice.   Our billing staff will not be able to assist you with questions regarding bills from these companies.  You will be contacted with the lab results as soon as they are available. The fastest way to get your results is to activate your My Chart account. Instructions are located on the last page of this paperwork. If you have not heard from us regarding the results in 2 weeks, please contact this office.        If you have lab work done today you will be contacted with your lab results within the next 2 weeks.  If you have not heard from us then please contact us. The fastest way to get your results is to register for My Chart.   IF you received an x-ray today, you will receive an invoice from [facility]. Please contact [facility] at [phone] with questions or concerns regarding your invoice.   IF you received labwork today, you will receive an invoice from [facility]. Please contact [facility] at [phone] with questions or concerns regarding your invoice.   Our billing staff will not be able to assist you with questions regarding bills from these companies.  You will be contacted with the lab results as soon as they are available. The fastest way to get your results is to activate your My Chart account. Instructions are located on the last page of this paperwork. If you have not heard from us  regarding the results in 2 weeks, please contact this office.     

## 2019-09-22 NOTE — Telephone Encounter (Signed)
09/22/2019 - PATIENT HAD A MY-CHART VIDEO APPOINTMENT WITH DR. Benay Spice ON Monday (09/22/2019). DR. Benay Spice HAS REQUESTED SHE RETURN FOR FOLLOW-UP IN 6 MONTHS. I TRIED TO CALL AND SCHEDULE BUT HAD TO LEAVE A MESSAGE ON HER VOICE MAIL TO RETURN MY CALL AT HER CONVENIENCE. [place]

## 2019-09-22 NOTE — Progress Notes (Signed)
Virtual Visit Note  I connected with patient on 09/22/19 at 927am by video epic and verified that I am speaking with the correct person using two identifiers. [name] is currently located at home and patient is currently with them during visit. The provider, Rutherford Guys, MD is located in their office at time of visit.  I discussed the limitations, risks, security and privacy concerns of performing an evaluation and management service by telephone and the availability of in person appointments. I also discussed with the patient that there may be a patient responsible charge related to this service. The patient expressed understanding and agreed to proceed.   I provided 23 minutes of non-face-to-face time during this encounter.  Chief Complaint  Patient presents with  . Follow-up    6 month follow up, not able to com in the building due to pending covid screening and present cough. Pt has upcoming surgery due to bladder cancer. No medication is needed at this time. No anxiety, depression or falls    HPI ? PMH: HTN, HLP, bladder cancer (low grade)  Last OV de 2020 -   Will be having urethral resection later this month for new cancer lesion, Dr Tresa Moore preop thru anesthesia at the hospital  Overall she is doing well Feeling tired as just came back from [place] taking care of her 2 and 72 yo grandchildren Came back to [place] (having work done) and has had a mildly productive cough for past week.  Denies any f/c/sob/chest pain/sore throat/ear pain Has had covid vaccines Denies any nausea/vomiting/abd pain/changes in BM Denies any dysuria or hematuria Does not check BP at home, reports normal BP at urologists office Taking meds as prescribed  BP Readings from Last 3 Encounters:  06/13/19 (!) 142/71  03/31/19 135/86  10/22/18 136/80   Lab Results  Component Value Date   CHOL 182 03/31/2019   HDL 53 03/31/2019   LDLCALC 105 (H) 03/31/2019   TRIG 134 03/31/2019    CHOLHDL 3.4 03/31/2019   Lab Results  Component Value Date   CREATININE 0.90 06/13/2019   BUN 14 06/13/2019   NA 140 06/13/2019   K 3.8 06/13/2019   CL 100 06/13/2019   CO2 25 03/31/2019    Allergies  Allergen Reactions  . Hycodan [Hydrocodone-Homatropine] Hives    Prior to Admission medications   Medication Sig Start Date End Date Taking? Authorizing Provider  amLODipine (NORVASC) 10 MG tablet Take 1 tablet (10 mg total) by mouth daily. 03/31/19  Yes Rutherford Guys, MD  atorvastatin (LIPITOR) 20 MG tablet Take 1 tablet (20 mg total) by mouth daily. 03/31/19  Yes Rutherford Guys, MD  triamterene-hydrochlorothiazide (MAXZIDE-25) 37.5-25 MG tablet TAKE 1 TABLET BY MOUTH EVERY DAY Patient taking differently: Take 1 tablet by mouth daily.  09/09/19  Yes Rutherford Guys, MD    Past Medical History:  Diagnosis Date  . Bladder cancer ([place]) [place]  . Duplex kidney   . Duplex kidney    RIGHT  . History of adenomatous polyp of colon   . History of recurrent UTIs   . Hyperlipidemia   . Hypertension   . Wears contact lenses     Past Surgical History:  Procedure Laterality Date  . BENIGN BREAST BX Right 1995  . BUNIONECTOMY Right 2000  . bunionectomy left foot  04/2016  . COLONOSCOPY W/ POLYPECTOMY  01/06/2013   single sessile polyp ascending colon.  [facility].  Marland Kitchen  CYSTOSCOPY W/ RETROGRADES Bilateral 12/10/2013   Procedure: RETROGRADE PYELOGRAM  ;  Surgeon: Alexis Frock, MD;  Location[facility];  Service: Urology;  Laterality: Bilateral;  . CYSTOSCOPY W/ RETROGRADES Bilateral 07/15/2014   Procedure: CYSTOSCOPY WITH RETROGRADE PYELOGRAM AND INSTILLATION OF MITOMYCIN C;  Surgeon: Alexis Frock, MD;  Location: [facility];  Service: Urology;  Laterality: Bilateral;  . CYSTOSCOPY W/ RETROGRADES Bilateral 12/31/2015   Procedure: CYSTOSCOPY WITH RETROGRADE PYELOGRAM;  Surgeon: Alexis Frock, MD;  Location: [facility];  Service: Urology;  Laterality: Bilateral;  . CYSTOSCOPY W/ RETROGRADES Bilateral 11/17/2016   Procedure: CYSTOSCOPY WITH RETROGRADE PYELOGRAM;  Surgeon: Alexis Frock, MD;  Location: [facility];  Service: Urology;  Laterality: Bilateral;  . CYSTOSCOPY W/ RETROGRADES Bilateral 06/13/2019   Procedure: CYSTOSCOPY WITH RETROGRADE PYELOGRAM;  Surgeon: Alexis Frock, MD;  Location[facility];  Service: Urology;  Laterality: Bilateral;  . CYSTOSCOPY W/ URETERAL STENT PLACEMENT Bilateral 10/22/2013   Procedure: CYSTOSCOPY WITH RETROGRADE PYELOGRAM;  Surgeon: Alexis Frock, MD;  Location: [facility];  Service: Urology;  Laterality: Bilateral;  . CYSTOSCOPY WITH RETROGRADE PYELOGRAM, URETEROSCOPY AND STENT PLACEMENT Bilateral 04/14/2015   Procedure: CYSTOSCOPY WITH RETROGRADE PYELOGRAM, URETEROSCOPY AND RIGHT STENT PLACEMENT;  Surgeon: Alexis Frock, MD;  Location: [facility];  Service: Urology;  Laterality: Bilateral;  . TONSILLECTOMY  49   (age 69)  . TRANSURETHRAL RESECTION OF BLADDER TUMOR N/A 12/31/2015   Procedure: TRANSURETHRAL RESECTION OF BLADDER TUMOR (TURBT) WITH GYRUS;  Surgeon: Alexis Frock, MD;  Location: [facility];  Service: Urology;  Laterality: N/A;  . TRANSURETHRAL RESECTION OF BLADDER TUMOR N/A 11/17/2016   Procedure: TRANSURETHRAL RESECTION OF BLADDER TUMOR (TURBT);  Surgeon: Alexis Frock, MD;  Location: [facility];  Service: Urology;  Laterality: N/A;  . TRANSURETHRAL RESECTION OF BLADDER TUMOR N/A 06/13/2019   Procedure: TRANSURETHRAL RESECTION OF BLADDER TUMOR (TURBT). LEFT DIAGNOSTIC URETEROSCOPY AND LEFT STENT PLACEMENT;  Surgeon: Alexis Frock, MD;  Location: [facility];  Service: Urology;  Laterality: N/A;  13 MINS  . TRANSURETHRAL RESECTION OF BLADDER TUMOR WITH GYRUS (TURBT-GYRUS) N/A 10/22/2013   Procedure: TRANSURETHRAL RESECTION OF BLADDER TUMOR WITH  ERBE.;  Surgeon: Alexis Frock, MD;  Location: [facility];  Service: Urology;  Laterality: N/A;  . TRANSURETHRAL RESECTION OF BLADDER TUMOR WITH GYRUS (TURBT-GYRUS) N/A 12/10/2013   Procedure: RESTAGING TRANSURETHRAL RESECTION OF BLADDER TUMOR WITH GYRUS (TURBT-GYRUS);  Surgeon: Alexis Frock, MD;  Location: [facility];  Service: Urology;  Laterality: N/A;  . TRANSURETHRAL RESECTION OF BLADDER TUMOR WITH GYRUS (TURBT-GYRUS) N/A 07/15/2014   Procedure: TRANSURETHRAL RESECTION OF BLADDER TUMOR WITH GYRUS (TURBT-GYRUS);  Surgeon: Alexis Frock, MD;  Location: [facility];  Service: Urology;  Laterality: N/A;  . TRANSURETHRAL RESECTION OF BLADDER TUMOR WITH GYRUS (TURBT-GYRUS) N/A 04/14/2015   Procedure: TRANSURETHRAL RESECTION OF BLADDER TUMOR WITH GYRUS (TURBT-GYRUS);  Surgeon: Alexis Frock, MD;  Location: [facility];  Service: Urology;  Laterality: N/A;    Social History   Tobacco Use  . Smoking status: Former Smoker    Packs/day: 0.15    Years: 20.00    Pack years: 3.00    Types: Cigarettes    Quit date: 10/17/2012    Years since quitting: 6.9  . Smokeless tobacco: Never Used  Substance Use Topics  . Alcohol use: Yes    Comment: OCCASIONAL    Family History  Problem Relation Age of Onset  .  Hypertension Mother   . Arthritis Mother        knee replacement; shoulder replacement  . Heart disease Mother        AAA; mild AMI in 2018  . Stroke Mother 87       TIA  . Cancer Father        prostate cancer  . Cancer Brother        prostate cancer  . Cancer Brother 50       Melanoma  . Mental illness Sister 106       bipolar disorder    ROS Per hpi  Objective  Vitals as reported by the patient: none  GEN: AAOx3, NAD HEENT: [place]/AT, pupils are symmetrical, EOMI, non-icteric sclera Resp: breathing comfortably, speaking in full sentences Skin: no rashes noted, no pallor Psych: good eye contact, normal mood and  affect   ASSESSMENT and PLAN  1. Essential hypertension, benign Controlled. Continue current regime.   2. Pure hypercholesterolemia Controlled. Continue current regime.   3. Malignant neoplasm of urinary bladder, unspecified site [facility]) Managed by urology, upcoming surgery  4. Cough In setting of high levels of dust, no ssx concerning for infection. Discussed supportive measures and OTC meds. Reviewed RTC precautions.   Other orders - amLODipine (NORVASC) 10 MG tablet; Take 1 tablet (10 mg total) by mouth daily. - atorvastatin (LIPITOR) 20 MG tablet; Take 1 tablet (20 mg total) by mouth daily. - triamterene-hydrochlorothiazide (MAXZIDE-25) 37.5-25 MG tablet; Take 1 tablet by mouth daily.  FOLLOW-UP: 6  month   The above assessment and management plan was discussed with the patient. The patient verbalized understanding of and has agreed to the management plan. Patient is aware to call the clinic if symptoms persist or worsen. Patient is aware when to return to the clinic for a follow-up visit. Patient educated on when it is appropriate to go to the emergency department.     Rutherford Guys, MD Primary Care at [facility] [place], [place] [address] Ph.  [phone] Fax [phone]

## 2019-09-23 ENCOUNTER — Encounter (HOSPITAL_COMMUNITY)
Admission: RE | Admit: 2019-09-23 | Discharge: 2019-09-23 | Disposition: A | Discharge status: Home / Self Care | Payer: Medicare Other | Source: Ambulatory Visit | Attending: Urology | Admitting: Urology

## 2019-09-23 DIAGNOSIS — N3020 Other chronic cystitis without hematuria: Secondary | ICD-10-CM | POA: Diagnosis not present

## 2019-09-23 DIAGNOSIS — C678 Malignant neoplasm of overlapping sites of bladder: Secondary | ICD-10-CM | POA: Diagnosis not present

## 2019-09-23 DIAGNOSIS — Z01812 Encounter for preprocedural laboratory examination: Secondary | ICD-10-CM | POA: Insufficient documentation

## 2019-09-23 LAB — BASIC METABOLIC PANEL
Anion gap: 11 (ref 5–15)
BUN: 19 mg/dL (ref 8–23)
CO2: 25 mmol/L (ref 22–32)
Calcium: 8.8 mg/dL — ABNORMAL LOW (ref 8.9–10.3)
Chloride: 101 mmol/L (ref 98–111)
Creatinine, Ser: 1.08 mg/dL — ABNORMAL HIGH (ref 0.44–1.00)
GFR calc Af Amer: 60 mL/min — ABNORMAL LOW (ref >60)
GFR calc non Af Amer: 52 mL/min — ABNORMAL LOW (ref >60)
Glucose, Bld: 145 mg/dL — ABNORMAL HIGH (ref 70–99)
Potassium: 3.7 mmol/L (ref 3.5–5.1)
Sodium: 137 mmol/L (ref 135–145)

## 2019-09-23 LAB — CBC
HCT: 36.5 % (ref 36.0–46.0)
Hemoglobin: 12.3 g/dL (ref 12.0–15.0)
MCH: 31.1 pg (ref 26.0–34.0)
MCHC: 33.7 g/dL (ref 30.0–36.0)
MCV: 92.2 fL (ref 80.0–100.0)
Platelets: 301 10*3/uL (ref 150–400)
RBC: 3.96 MIL/uL (ref 3.87–5.11)
RDW: 12 % (ref 11.5–15.5)
WBC: 11.1 10*3/uL — ABNORMAL HIGH (ref 4.0–10.5)
nRBC: 0 % (ref 0.0–0.2)

## 2019-09-23 LAB — ABO/RH: ABO/RH(D): A POS

## 2019-09-30 ENCOUNTER — Other Ambulatory Visit (HOSPITAL_COMMUNITY)
Admission: RE | Admit: 2019-09-30 | Discharge: 2019-09-30 | Disposition: A | Discharge status: Home / Self Care | Payer: Medicare Other | Source: Ambulatory Visit | Attending: Urology | Admitting: Urology

## 2019-09-30 DIAGNOSIS — Z01812 Encounter for preprocedural laboratory examination: Secondary | ICD-10-CM | POA: Insufficient documentation

## 2019-09-30 DIAGNOSIS — Z87891 Personal history of nicotine dependence: Secondary | ICD-10-CM | POA: Diagnosis not present

## 2019-09-30 DIAGNOSIS — Z808 Family history of malignant neoplasm of other organs or systems: Secondary | ICD-10-CM | POA: Diagnosis not present

## 2019-09-30 DIAGNOSIS — Z8551 Personal history of malignant neoplasm of bladder: Secondary | ICD-10-CM | POA: Diagnosis not present

## 2019-09-30 DIAGNOSIS — Z8261 Family history of arthritis: Secondary | ICD-10-CM | POA: Diagnosis not present

## 2019-09-30 DIAGNOSIS — I10 Essential (primary) hypertension: Secondary | ICD-10-CM | POA: Diagnosis not present

## 2019-09-30 DIAGNOSIS — Z885 Allergy status to narcotic agent status: Secondary | ICD-10-CM | POA: Diagnosis not present

## 2019-09-30 DIAGNOSIS — Z8249 Family history of ischemic heart disease and other diseases of the circulatory system: Secondary | ICD-10-CM | POA: Diagnosis not present

## 2019-09-30 DIAGNOSIS — Z20822 Contact with and (suspected) exposure to covid-19: Secondary | ICD-10-CM | POA: Insufficient documentation

## 2019-09-30 DIAGNOSIS — Z87440 Personal history of urinary (tract) infections: Secondary | ICD-10-CM | POA: Diagnosis not present

## 2019-09-30 DIAGNOSIS — C662 Malignant neoplasm of left ureter: Secondary | ICD-10-CM | POA: Diagnosis not present

## 2019-09-30 DIAGNOSIS — Z823 Family history of stroke: Secondary | ICD-10-CM | POA: Diagnosis not present

## 2019-09-30 LAB — SARS CORONAVIRUS 2 (TAT 6-24 HRS): SARS Coronavirus 2: NEGATIVE

## 2019-10-03 ENCOUNTER — Encounter (HOSPITAL_COMMUNITY)
Admission: RE | Disposition: A | Discharge status: Home / Self Care | Payer: Self-pay | Source: Home / Self Care | Attending: Urology

## 2019-10-03 ENCOUNTER — Encounter (HOSPITAL_COMMUNITY): Payer: Self-pay | Admitting: Urology

## 2019-10-03 ENCOUNTER — Other Ambulatory Visit: Payer: Self-pay

## 2019-10-03 ENCOUNTER — Inpatient Hospital Stay (HOSPITAL_COMMUNITY)
Admission: RE | Admit: 2019-10-03 | Discharge: 2019-10-04 | DRG: 658 | Disposition: A | Discharge status: Home / Self Care | Payer: Medicare Other | Attending: Urology | Admitting: Urology

## 2019-10-03 ENCOUNTER — Inpatient Hospital Stay (HOSPITAL_COMMUNITY): Payer: Medicare Other | Admitting: Registered Nurse

## 2019-10-03 ENCOUNTER — Inpatient Hospital Stay (HOSPITAL_COMMUNITY): Payer: Medicare Other

## 2019-10-03 DIAGNOSIS — C662 Malignant neoplasm of left ureter: Principal | ICD-10-CM | POA: Diagnosis present

## 2019-10-03 DIAGNOSIS — Z8551 Personal history of malignant neoplasm of bladder: Secondary | ICD-10-CM

## 2019-10-03 DIAGNOSIS — Z823 Family history of stroke: Secondary | ICD-10-CM | POA: Diagnosis not present

## 2019-10-03 DIAGNOSIS — Z808 Family history of malignant neoplasm of other organs or systems: Secondary | ICD-10-CM | POA: Diagnosis not present

## 2019-10-03 DIAGNOSIS — Z87440 Personal history of urinary (tract) infections: Secondary | ICD-10-CM

## 2019-10-03 DIAGNOSIS — Z6830 Body mass index (BMI) 30.0-30.9, adult: Secondary | ICD-10-CM | POA: Diagnosis not present

## 2019-10-03 DIAGNOSIS — Z8261 Family history of arthritis: Secondary | ICD-10-CM

## 2019-10-03 DIAGNOSIS — Z8249 Family history of ischemic heart disease and other diseases of the circulatory system: Secondary | ICD-10-CM

## 2019-10-03 DIAGNOSIS — E7800 Pure hypercholesterolemia, unspecified: Secondary | ICD-10-CM | POA: Diagnosis not present

## 2019-10-03 DIAGNOSIS — C679 Malignant neoplasm of bladder, unspecified: Secondary | ICD-10-CM | POA: Diagnosis not present

## 2019-10-03 DIAGNOSIS — Z20822 Contact with and (suspected) exposure to covid-19: Secondary | ICD-10-CM | POA: Diagnosis present

## 2019-10-03 DIAGNOSIS — E669 Obesity, unspecified: Secondary | ICD-10-CM | POA: Diagnosis not present

## 2019-10-03 DIAGNOSIS — Z885 Allergy status to narcotic agent status: Secondary | ICD-10-CM

## 2019-10-03 DIAGNOSIS — Z87891 Personal history of nicotine dependence: Secondary | ICD-10-CM | POA: Diagnosis not present

## 2019-10-03 DIAGNOSIS — I10 Essential (primary) hypertension: Secondary | ICD-10-CM | POA: Diagnosis present

## 2019-10-03 DIAGNOSIS — N2889 Other specified disorders of kidney and ureter: Secondary | ICD-10-CM | POA: Diagnosis not present

## 2019-10-03 HISTORY — PX: CYSTOSCOPY W/ URETERAL STENT PLACEMENT: SHX1429

## 2019-10-03 LAB — TYPE AND SCREEN
ABO/RH(D): A POS
Antibody Screen: NEGATIVE

## 2019-10-03 LAB — HEMOGLOBIN AND HEMATOCRIT, BLOOD
HCT: 39.9 % (ref 36.0–46.0)
Hemoglobin: 13.5 g/dL (ref 12.0–15.0)

## 2019-10-03 SURGERY — REIMPLANTATION, URETER, ROBOT-ASSISTED, LAPAROSCOPIC
Anesthesia: General | Laterality: Left

## 2019-10-03 MED ORDER — PROPOFOL 10 MG/ML IV BOLUS
INTRAVENOUS | Status: DC | PRN
  Administered 2019-10-03: 150 mg via INTRAVENOUS

## 2019-10-03 MED ORDER — PHENYLEPHRINE HCL (PRESSORS) 10 MG/ML IV SOLN
INTRAVENOUS | Status: AC
  Filled 2019-10-03: qty 1

## 2019-10-03 MED ORDER — FENTANYL CITRATE (PF) 250 MCG/5ML IJ SOLN
INTRAMUSCULAR | Status: AC
  Filled 2019-10-03: qty 5

## 2019-10-03 MED ORDER — TRAMADOL HCL 50 MG PO TABS: 50.0000 mg | ORAL_TABLET | Freq: Four times a day (QID) | ORAL | 0 refills | Status: DC | PRN

## 2019-10-03 MED ORDER — AMLODIPINE BESYLATE 10 MG PO TABS
10.0000 mg | ORAL_TABLET | Freq: Every day | ORAL | Status: DC
  Administered 2019-10-04: 10 mg via ORAL
  Filled 2019-10-03 (×2): qty 1

## 2019-10-03 MED ORDER — ONDANSETRON HCL 4 MG/2ML IJ SOLN: 4.0000 mg | Freq: Once | INTRAMUSCULAR | Status: DC | PRN

## 2019-10-03 MED ORDER — KCL IN DEXTROSE-NACL 20-5-0.45 MEQ/L-%-% IV SOLN
INTRAVENOUS | Status: DC
  Filled 2019-10-03 (×3): qty 1000

## 2019-10-03 MED ORDER — MAGNESIUM CITRATE PO SOLN
1.0000 | Freq: Once | ORAL | Status: DC
  Filled 2019-10-03: qty 296

## 2019-10-03 MED ORDER — CEFAZOLIN SODIUM-DEXTROSE 2-4 GM/100ML-% IV SOLN
2.0000 g | INTRAVENOUS | Status: AC
  Administered 2019-10-03: 2 g via INTRAVENOUS
  Filled 2019-10-03: qty 100

## 2019-10-03 MED ORDER — ACETAMINOPHEN 10 MG/ML IV SOLN
INTRAVENOUS | Status: DC | PRN
  Administered 2019-10-03: 1000 mg via INTRAVENOUS

## 2019-10-03 MED ORDER — ORAL CARE MOUTH RINSE: 15.0000 mL | Freq: Once | OROMUCOSAL | Status: AC

## 2019-10-03 MED ORDER — SUGAMMADEX SODIUM 200 MG/2ML IV SOLN
INTRAVENOUS | Status: DC | PRN
  Administered 2019-10-03: 200 mg via INTRAVENOUS

## 2019-10-03 MED ORDER — ROCURONIUM BROMIDE 10 MG/ML (PF) SYRINGE
PREFILLED_SYRINGE | INTRAVENOUS | Status: DC | PRN
  Administered 2019-10-03: 45 mg via INTRAVENOUS
  Administered 2019-10-03: 10 mg via INTRAVENOUS
  Administered 2019-10-03 (×2): 15 mg via INTRAVENOUS

## 2019-10-03 MED ORDER — SODIUM CHLORIDE (PF) 0.9 % IJ SOLN
INTRAMUSCULAR | Status: AC
  Filled 2019-10-03: qty 20

## 2019-10-03 MED ORDER — SULFAMETHOXAZOLE-TRIMETHOPRIM 800-160 MG PO TABS
1.0000 | ORAL_TABLET | Freq: Two times a day (BID) | ORAL | 0 refills | Status: DC
Start: 2019-10-03 — End: 2020-04-12

## 2019-10-03 MED ORDER — CHLORHEXIDINE GLUCONATE 0.12 % MT SOLN
15.0000 mL | Freq: Once | OROMUCOSAL | Status: AC
  Administered 2019-10-03: 15 mL via OROMUCOSAL

## 2019-10-03 MED ORDER — LACTATED RINGERS IR SOLN
Status: DC | PRN
  Administered 2019-10-03: 1000 mL

## 2019-10-03 MED ORDER — SENNOSIDES-DOCUSATE SODIUM 8.6-50 MG PO TABS
1.0000 | ORAL_TABLET | Freq: Two times a day (BID) | ORAL | 0 refills | Status: DC
Start: 2019-10-03 — End: 2020-04-12

## 2019-10-03 MED ORDER — HYDROMORPHONE HCL 1 MG/ML IJ SOLN: 0.2500 mg | INTRAMUSCULAR | Status: DC | PRN

## 2019-10-03 MED ORDER — LIDOCAINE 2% (20 MG/ML) 5 ML SYRINGE
INTRAMUSCULAR | Status: AC
  Filled 2019-10-03: qty 5

## 2019-10-03 MED ORDER — DIPHENHYDRAMINE HCL 50 MG/ML IJ SOLN: 12.5000 mg | Freq: Four times a day (QID) | INTRAMUSCULAR | Status: DC | PRN

## 2019-10-03 MED ORDER — ONDANSETRON HCL 4 MG/2ML IJ SOLN
INTRAMUSCULAR | Status: DC | PRN
  Administered 2019-10-03: 4 mg via INTRAVENOUS

## 2019-10-03 MED ORDER — OXYCODONE HCL 5 MG PO TABS
5.0000 mg | ORAL_TABLET | ORAL | Status: DC | PRN
  Administered 2019-10-04: 5 mg via ORAL
  Filled 2019-10-03: qty 1

## 2019-10-03 MED ORDER — PROPOFOL 10 MG/ML IV BOLUS
INTRAVENOUS | Status: AC
  Filled 2019-10-03: qty 20

## 2019-10-03 MED ORDER — CEFAZOLIN SODIUM-DEXTROSE 1-4 GM/50ML-% IV SOLN
1.0000 g | Freq: Three times a day (TID) | INTRAVENOUS | Status: AC
  Administered 2019-10-03 – 2019-10-04 (×2): 1 g via INTRAVENOUS
  Filled 2019-10-03 (×2): qty 50

## 2019-10-03 MED ORDER — LACTATED RINGERS IV SOLN: INTRAVENOUS | Status: DC

## 2019-10-03 MED ORDER — FENTANYL CITRATE (PF) 100 MCG/2ML IJ SOLN
INTRAMUSCULAR | Status: DC | PRN
  Administered 2019-10-03 (×5): 50 ug via INTRAVENOUS

## 2019-10-03 MED ORDER — MIDAZOLAM HCL 5 MG/5ML IJ SOLN
INTRAMUSCULAR | Status: DC | PRN
  Administered 2019-10-03: 2 mg via INTRAVENOUS

## 2019-10-03 MED ORDER — FENTANYL CITRATE (PF) 100 MCG/2ML IJ SOLN
INTRAMUSCULAR | Status: AC
  Administered 2019-10-03: 50 ug via INTRAVENOUS
  Filled 2019-10-03: qty 2

## 2019-10-03 MED ORDER — BUPIVACAINE LIPOSOME 1.3 % IJ SUSP
20.0000 mL | Freq: Once | INTRAMUSCULAR | Status: AC
  Administered 2019-10-03: 20 mL
  Filled 2019-10-03: qty 20

## 2019-10-03 MED ORDER — ROCURONIUM BROMIDE 10 MG/ML (PF) SYRINGE
PREFILLED_SYRINGE | INTRAVENOUS | Status: AC
  Filled 2019-10-03: qty 10

## 2019-10-03 MED ORDER — ACETAMINOPHEN 10 MG/ML IV SOLN
INTRAVENOUS | Status: AC
  Filled 2019-10-03: qty 100

## 2019-10-03 MED ORDER — CHLORHEXIDINE GLUCONATE CLOTH 2 % EX PADS
6.0000 | MEDICATED_PAD | Freq: Every day | CUTANEOUS | Status: DC
  Administered 2019-10-04: 6 via TOPICAL

## 2019-10-03 MED ORDER — ACETAMINOPHEN 325 MG PO TABS
650.0000 mg | ORAL_TABLET | ORAL | Status: DC | PRN
  Administered 2019-10-04: 650 mg via ORAL
  Filled 2019-10-03: qty 2

## 2019-10-03 MED ORDER — LIDOCAINE 2% (20 MG/ML) 5 ML SYRINGE
INTRAMUSCULAR | Status: DC | PRN
  Administered 2019-10-03: 100 mg via INTRAVENOUS

## 2019-10-03 MED ORDER — DOCUSATE SODIUM 100 MG PO CAPS
100.0000 mg | ORAL_CAPSULE | Freq: Two times a day (BID) | ORAL | Status: DC
  Administered 2019-10-03 – 2019-10-04 (×2): 100 mg via ORAL
  Filled 2019-10-03 (×2): qty 1

## 2019-10-03 MED ORDER — ONDANSETRON HCL 4 MG/2ML IJ SOLN
4.0000 mg | INTRAMUSCULAR | Status: DC | PRN
  Administered 2019-10-03 (×2): 4 mg via INTRAVENOUS
  Filled 2019-10-03 (×2): qty 2

## 2019-10-03 MED ORDER — DEXAMETHASONE SODIUM PHOSPHATE 10 MG/ML IJ SOLN
INTRAMUSCULAR | Status: AC
  Filled 2019-10-03: qty 1

## 2019-10-03 MED ORDER — DEXAMETHASONE SODIUM PHOSPHATE 10 MG/ML IJ SOLN
INTRAMUSCULAR | Status: DC | PRN
  Administered 2019-10-03: 8 mg via INTRAVENOUS

## 2019-10-03 MED ORDER — ONDANSETRON HCL 4 MG/2ML IJ SOLN
INTRAMUSCULAR | Status: AC
  Filled 2019-10-03: qty 2

## 2019-10-03 MED ORDER — DIPHENHYDRAMINE HCL 12.5 MG/5ML PO ELIX: 12.5000 mg | ORAL_SOLUTION | Freq: Four times a day (QID) | ORAL | Status: DC | PRN

## 2019-10-03 MED ORDER — MORPHINE SULFATE (PF) 4 MG/ML IV SOLN: 2.0000 mg | INTRAVENOUS | Status: DC | PRN

## 2019-10-03 MED ORDER — FENTANYL CITRATE (PF) 100 MCG/2ML IJ SOLN
25.0000 ug | INTRAMUSCULAR | Status: DC | PRN
  Administered 2019-10-03: 50 ug via INTRAVENOUS

## 2019-10-03 MED ORDER — IOHEXOL 300 MG/ML  SOLN
INTRAMUSCULAR | Status: DC | PRN
  Administered 2019-10-03: 13 mL

## 2019-10-03 MED ORDER — STERILE WATER FOR IRRIGATION IR SOLN
Status: DC | PRN
  Administered 2019-10-03: 3000 mL

## 2019-10-03 MED ORDER — MIDAZOLAM HCL 2 MG/2ML IJ SOLN
INTRAMUSCULAR | Status: AC
  Filled 2019-10-03: qty 2

## 2019-10-03 MED ORDER — ACETAMINOPHEN 500 MG PO TABS
1000.0000 mg | ORAL_TABLET | Freq: Three times a day (TID) | ORAL | Status: AC
  Administered 2019-10-03 – 2019-10-04 (×3): 1000 mg via ORAL
  Filled 2019-10-03 (×3): qty 2

## 2019-10-03 SURGICAL SUPPLY — 112 items
APPLICATOR COTTON TIP 6 STRL (MISCELLANEOUS) ×1 IMPLANT
APPLICATOR COTTON TIP 6IN STRL (MISCELLANEOUS) ×2
APPLICATOR SURGIFLO ENDO (HEMOSTASIS) IMPLANT
BAG RETRIEVAL 10 (BASKET) ×1
BAG URINE DRAIN 2000ML AR STRL (UROLOGICAL SUPPLIES) ×2 IMPLANT
BASKET ZERO TIP NITINOL 2.4FR (BASKET) IMPLANT
CATH FOLEY 3WAY  5CC 18FR (CATHETERS) ×2
CATH FOLEY 3WAY 30CC 22FR (CATHETERS) ×2 IMPLANT
CATH FOLEY 3WAY 5CC 18FR (CATHETERS) ×1 IMPLANT
CATH INTERMIT  6FR 70CM (CATHETERS) ×2 IMPLANT
CHLORAPREP W/TINT 26 (MISCELLANEOUS) ×2 IMPLANT
CLIP VESOLOCK LG 6/CT PURPLE (CLIP) ×2 IMPLANT
CLIP VESOLOCK MED LG 6/CT (CLIP) ×2 IMPLANT
CLOTH BEACON ORANGE TIMEOUT ST (SAFETY) IMPLANT
COVER SURGICAL LIGHT HANDLE (MISCELLANEOUS) ×2 IMPLANT
COVER TIP SHEARS 8 DVNC (MISCELLANEOUS) ×1 IMPLANT
COVER TIP SHEARS 8MM DA VINCI (MISCELLANEOUS) ×2
COVER WAND RF STERILE (DRAPES) IMPLANT
DECANTER SPIKE VIAL GLASS SM (MISCELLANEOUS) ×2 IMPLANT
DERMABOND ADVANCED (GAUZE/BANDAGES/DRESSINGS) ×1
DERMABOND ADVANCED .7 DNX12 (GAUZE/BANDAGES/DRESSINGS) ×1 IMPLANT
DRAIN CHANNEL 15F RND FF 3/16 (WOUND CARE) ×2 IMPLANT
DRAIN CHANNEL RND F F (WOUND CARE) IMPLANT
DRAPE ARM DVNC X/XI (DISPOSABLE) ×4 IMPLANT
DRAPE COLUMN DVNC XI (DISPOSABLE) ×1 IMPLANT
DRAPE DA VINCI XI ARM (DISPOSABLE) ×8
DRAPE DA VINCI XI COLUMN (DISPOSABLE) ×2
DRAPE INCISE IOBAN 66X45 STRL (DRAPES) ×2 IMPLANT
DRAPE SHEET LG 3/4 BI-LAMINATE (DRAPES) ×4 IMPLANT
DRAPE SURG IRRIG POUCH 19X23 (DRAPES) ×2 IMPLANT
ELECT PENCIL ROCKER SW 15FT (MISCELLANEOUS) ×2 IMPLANT
ELECT REM PT RETURN 15FT ADLT (MISCELLANEOUS) ×2 IMPLANT
EVACUATOR SILICONE 100CC (DRAIN) ×2 IMPLANT
GLOVE BIO SURGEON STRL SZ 6.5 (GLOVE) ×6 IMPLANT
GLOVE BIOGEL M STRL SZ7.5 (GLOVE) ×6 IMPLANT
GLOVE BIOGEL PI IND STRL 7.0 (GLOVE) ×5 IMPLANT
GLOVE BIOGEL PI INDICATOR 7.0 (GLOVE) ×5
GOWN STRL REUS W/TWL LRG LVL3 (GOWN DISPOSABLE) ×8 IMPLANT
GUIDEWIRE ANG ZIPWIRE 038X150 (WIRE) ×2 IMPLANT
GUIDEWIRE STR DUAL SENSOR (WIRE) IMPLANT
HOLDER FOLEY CATH W/STRAP (MISCELLANEOUS) ×2 IMPLANT
IRRIG SUCT STRYKERFLOW 2 WTIP (MISCELLANEOUS) ×2
IRRIGATION SUCT STRKRFLW 2 WTP (MISCELLANEOUS) ×1 IMPLANT
KIT BASIN (CUSTOM PROCEDURE TRAY) ×2 IMPLANT
KIT TURNOVER KIT A (KITS) IMPLANT
LOOP VESSEL MAXI BLUE (MISCELLANEOUS) ×2 IMPLANT
MANIFOLD NEPTUNE II (INSTRUMENTS) ×2 IMPLANT
MARKER SKIN DUAL TIP RULER LAB (MISCELLANEOUS) ×2 IMPLANT
NDL SAFETY ECLIPSE 18X1.5 (NEEDLE) ×1 IMPLANT
NEEDLE HYPO 18GX1.5 SHARP (NEEDLE) ×2
NEEDLE INSUFFLATION 14GA 120MM (NEEDLE) ×2 IMPLANT
PACK CYSTO (CUSTOM PROCEDURE TRAY) IMPLANT
PAD POSITIONING PINK XL (MISCELLANEOUS) ×2 IMPLANT
PENCIL SMOKE EVACUATOR (MISCELLANEOUS) IMPLANT
PLUG CATH AND CAP STER (CATHETERS) ×2 IMPLANT
PORT ACCESS TROCAR AIRSEAL 12 (TROCAR) ×1 IMPLANT
PORT ACCESS TROCAR AIRSEAL 5M (TROCAR) ×1
POUCH SPECIMEN RETRIEVAL 10MM (ENDOMECHANICALS) IMPLANT
SEAL CANN UNIV 5-8 DVNC XI (MISCELLANEOUS) ×4 IMPLANT
SEAL XI 5MM-8MM UNIVERSAL (MISCELLANEOUS) ×8
SET TRI-LUMEN FLTR TB AIRSEAL (TUBING) ×2 IMPLANT
SHEET LAVH (DRAPES) IMPLANT
SOLUTION ELECTROLUBE (MISCELLANEOUS) ×2 IMPLANT
SPONGE LAP 4X18 RFD (DISPOSABLE) ×2 IMPLANT
STENT URET 6FRX26 CONTOUR (STENTS) ×2 IMPLANT
SURGIFLO W/THROMBIN 8M KIT (HEMOSTASIS) IMPLANT
SUT ETHILON 3 0 FSL (SUTURE) ×2 IMPLANT
SUT MNCRL 3 0 VIOLET RB1 (SUTURE) IMPLANT
SUT MNCRL AB 4-0 PS2 18 (SUTURE) ×4 IMPLANT
SUT MON AB 2-0 SH 27 (SUTURE)
SUT MON AB 2-0 SH27 (SUTURE) IMPLANT
SUT MONOCRYL 3 0 RB1 (SUTURE)
SUT PDS AB 0 CT1 36 (SUTURE) ×4 IMPLANT
SUT PDS AB 1 CTX 36 (SUTURE) ×4 IMPLANT
SUT PDS AB 2-0 CT2 27 (SUTURE) ×4 IMPLANT
SUT PDS AB 3-0 SH 27 (SUTURE) IMPLANT
SUT PDS AB 4-0 RB1 27 (SUTURE) IMPLANT
SUT PDS AB 4-0 SH 27 (SUTURE) IMPLANT
SUT PROLENE 2 0 SH DA (SUTURE) IMPLANT
SUT SILK 0 (SUTURE)
SUT SILK 0 30XBRD TIE 6 (SUTURE) IMPLANT
SUT SILK 2 0 (SUTURE)
SUT SILK 2 0 SH CR/8 (SUTURE) IMPLANT
SUT SILK 2-0 30XBRD TIE 12 (SUTURE) IMPLANT
SUT SILK 3 0 (SUTURE)
SUT SILK 3 0 12 30 (SUTURE) IMPLANT
SUT SILK 3-0 18XBRD TIE 12 (SUTURE) IMPLANT
SUT VIC AB 0 CT1 27 (SUTURE)
SUT VIC AB 0 CT1 27XBRD ANTBC (SUTURE) IMPLANT
SUT VIC AB 2-0 SH 27 (SUTURE)
SUT VIC AB 2-0 SH 27X BRD (SUTURE) IMPLANT
SUT VIC AB 3-0 SH 27 (SUTURE)
SUT VIC AB 3-0 SH 27X BRD (SUTURE) IMPLANT
SUT VIC AB 4-0 RB1 27 (SUTURE) ×6
SUT VIC AB 4-0 RB1 27XBRD (SUTURE) ×3 IMPLANT
SUT VIC AB 4-0 SH 18 (SUTURE) IMPLANT
SUT VICRYL 0 UR6 27IN ABS (SUTURE) ×2 IMPLANT
SUT VLOC 180 2-0 6IN GS21 (SUTURE) IMPLANT
SUT VLOC BARB 180 ABS3/0GR12 (SUTURE) ×2
SUTURE VLOC BRB 180 ABS3/0GR12 (SUTURE) ×1 IMPLANT
SYR TOOMEY IRRIG 70ML (MISCELLANEOUS) ×2
SYRINGE TOOMEY IRRIG 70ML (MISCELLANEOUS) ×1 IMPLANT
SYS BAG RETRIEVAL 10MM (BASKET) ×1
SYSTEM BAG RETRIEVAL 10MM (BASKET) ×1 IMPLANT
TOWEL OR 17X26 10 PK STRL BLUE (TOWEL DISPOSABLE) ×4 IMPLANT
TRAY LAPAROSCOPIC (CUSTOM PROCEDURE TRAY) ×2 IMPLANT
TROCAR XCEL 12X100 BLDLESS (ENDOMECHANICALS) ×2 IMPLANT
TROCAR XCEL NON-BLD 5MMX100MML (ENDOMECHANICALS) ×2 IMPLANT
TUBING CONNECTING 10 (TUBING) ×2 IMPLANT
TUBING UROLOGY SET (TUBING) IMPLANT
WATER STERILE IRR 1000ML POUR (IV SOLUTION) ×2 IMPLANT
YANKAUER SUCT BULB TIP 10FT TU (MISCELLANEOUS) IMPLANT

## 2019-10-03 NOTE — Brief Op Note (Signed)
10/03/2019  2:29 PM  PATIENT:  [name]  74 y.o. female  PRE-OPERATIVE DIAGNOSIS:  LEFT DISTAL URETERAL TUMOR  POST-OPERATIVE DIAGNOSIS:  LEFT DISTAL URETERAL TUMOR  PROCEDURE:  Procedure(s) with comments: XI ROBOTICALLY ASSISTED LAPAROSCOPIC URETERAL RE-IMPLANTATION (Left) - 2.5 HRS CYSTOSCOPY WITH STENT REPLACEMENT (Left)  SURGEON:  Surgeon(s) and Role:    Alexis Frock, MD - Primary  PHYSICIAN ASSISTANT:   ASSISTANTS: Carmie Kanner MD   ANESTHESIA:   local and general  EBL:  100 mL   BLOOD ADMINISTERED:none  DRAINS: 1 - JP to bulb; 2 - FOley to gravity   LOCAL MEDICATIONS USED:  MARCAINE     SPECIMEN:  Source of Specimen:  left distal ureter  DISPOSITION OF SPECIMEN:  PATHOLOGY  COUNTS:  YES  TOURNIQUET:  * No tourniquets in log *  DICTATION: .Other Dictation: Dictation Number [id number]   PLAN OF CARE: Admit to inpatient   PATIENT DISPOSITION:  PACU - hemodynamically stable.   Delay start of Pharmacological VTE agent (>24hrs) due to surgical blood loss or risk of bleeding: yes

## 2019-10-03 NOTE — H&P (Signed)
[name] is an 74 y.o. female.    Chief Complaint: Pre-Op LEFT Distal Ureterectomy  HPI:   1 - Recurrent Low Grade Bladder Cancer - Massive volume multifocal bladder cancer by CT and cysto 2015 on eval for persistant dysuria and hematuria. Large trigone and left wall components. No hydro or pelvic adenopathy by CT Urogram.    Summarized Recent Course:   10/2013 - TaG1 with muscle in specimen by massive volume TURBT; 01/2014 reTURBT TaG1  06/2014 multifocal small recurrence --> 07/2014 TURBT + Mito C T1G1 with muscle in specimen --> induction BCG x6  11/2016 cysto multifocal small volume recurrence --> TaG1 by TURBT / Retrogrades; 02/2017 Surv Cysto NED  06/2017 - cysto NED; 09/2017 cysto NED; 09/2017 cysto NED  06/2018 - cysto - no recurrence; 12/2018 cysto - no recurrence (? dome very very early papillary changes)  06/2019 - TaG1 small volume by TURBT AND about 2cm left distal ureteral tumor (all below iliacs)    PMH sig for TNA, foot surgery. NO CV disease. NO strong blood thinners. NO chest or abdominal surgeries. 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 No interval fevers. Most recent UCX non-clonal. Hgb 12.3. C19 screen negative.   Past Medical History:  Diagnosis Date  . Bladder cancer ([place]) [place]  . Duplex kidney   . Duplex kidney    RIGHT  . History of adenomatous polyp of colon   . History of recurrent UTIs   . Hyperlipidemia   . Hypertension   . Wears contact lenses     Past Surgical History:  Procedure Laterality Date  . BENIGN BREAST BX Right 1995  . BUNIONECTOMY Right 2000  . bunionectomy left foot  04/2016  . COLONOSCOPY W/ POLYPECTOMY  01/06/2013   single sessile polyp ascending colon.  [facility].  Marland Kitchen CYSTOSCOPY W/ RETROGRADES Bilateral 12/10/2013   Procedure: RETROGRADE  PYELOGRAM  ;  Surgeon: Alexis Frock, MD;  Location: [facility];  Service: Urology;  Laterality: Bilateral;  . CYSTOSCOPY W/ RETROGRADES Bilateral 07/15/2014   Procedure: CYSTOSCOPY WITH RETROGRADE PYELOGRAM AND INSTILLATION OF MITOMYCIN C;  Surgeon: Alexis Frock, MD;  Location: [facility];  Service: Urology;  Laterality: Bilateral;  . CYSTOSCOPY W/ RETROGRADES Bilateral 12/31/2015   Procedure: CYSTOSCOPY WITH RETROGRADE PYELOGRAM;  Surgeon: Alexis Frock, MD;  Location: [facility];  Service: Urology;  Laterality: Bilateral;  . CYSTOSCOPY W/ RETROGRADES Bilateral 11/17/2016   Procedure: CYSTOSCOPY WITH RETROGRADE PYELOGRAM;  Surgeon: Alexis Frock, MD;  Location: [facility];  Service: Urology;  Laterality: Bilateral;  . CYSTOSCOPY W/ RETROGRADES Bilateral 06/13/2019   Procedure: CYSTOSCOPY WITH RETROGRADE PYELOGRAM;  Surgeon: Alexis Frock, MD;  Location: [facility];  Service: Urology;  Laterality: Bilateral;  . CYSTOSCOPY W/ URETERAL STENT PLACEMENT Bilateral 10/22/2013   Procedure: CYSTOSCOPY WITH RETROGRADE PYELOGRAM;  Surgeon: Alexis Frock, MD;  Location: [facility];  Service: Urology;  Laterality: Bilateral;  . CYSTOSCOPY WITH RETROGRADE PYELOGRAM, URETEROSCOPY AND STENT PLACEMENT Bilateral 04/14/2015   Procedure: CYSTOSCOPY WITH RETROGRADE PYELOGRAM, URETEROSCOPY AND RIGHT STENT PLACEMENT;  Surgeon: Alexis Frock, MD;  Location: [facility];  Service: Urology;  Laterality: Bilateral;  . TONSILLECTOMY  70   (age 44)  . TRANSURETHRAL RESECTION OF BLADDER TUMOR N/A 12/31/2015   Procedure:  TRANSURETHRAL RESECTION OF BLADDER TUMOR (TURBT) WITH GYRUS;  Surgeon: Alexis Frock, MD;  Location: [facility];  Service: Urology;  Laterality: N/A;  . TRANSURETHRAL RESECTION OF BLADDER TUMOR N/A 11/17/2016   Procedure: TRANSURETHRAL RESECTION OF BLADDER TUMOR (TURBT);  Surgeon:  Alexis Frock, MD;  Location: [facility];  Service: Urology;  Laterality: N/A;  . TRANSURETHRAL RESECTION OF BLADDER TUMOR N/A 06/13/2019   Procedure: TRANSURETHRAL RESECTION OF BLADDER TUMOR (TURBT). LEFT DIAGNOSTIC URETEROSCOPY AND LEFT STENT PLACEMENT;  Surgeon: Alexis Frock, MD;  Location: [facility];  Service: Urology;  Laterality: N/A;  33 MINS  . TRANSURETHRAL RESECTION OF BLADDER TUMOR WITH GYRUS (TURBT-GYRUS) N/A 10/22/2013   Procedure: TRANSURETHRAL RESECTION OF BLADDER TUMOR WITH ERBE.;  Surgeon: Alexis Frock, MD;  Location: [facility];  Service: Urology;  Laterality: N/A;  . TRANSURETHRAL RESECTION OF BLADDER TUMOR WITH GYRUS (TURBT-GYRUS) N/A 12/10/2013   Procedure: RESTAGING TRANSURETHRAL RESECTION OF BLADDER TUMOR WITH GYRUS (TURBT-GYRUS);  Surgeon: Alexis Frock, MD;  Location: [facility];  Service: Urology;  Laterality: N/A;  . TRANSURETHRAL RESECTION OF BLADDER TUMOR WITH GYRUS (TURBT-GYRUS) N/A 07/15/2014   Procedure: TRANSURETHRAL RESECTION OF BLADDER TUMOR WITH GYRUS (TURBT-GYRUS);  Surgeon: Alexis Frock, MD;  Location: [facility];  Service: Urology;  Laterality: N/A;  . TRANSURETHRAL RESECTION OF BLADDER TUMOR WITH GYRUS (TURBT-GYRUS) N/A 04/14/2015   Procedure: TRANSURETHRAL RESECTION OF BLADDER TUMOR WITH GYRUS (TURBT-GYRUS);  Surgeon: Alexis Frock, MD;  Location: [facility];  Service: Urology;  Laterality: N/A;    Family History  Problem Relation Age of Onset  . Hypertension Mother   . Arthritis Mother        knee replacement; shoulder replacement  . Heart disease Mother        AAA; mild AMI in 2018  . Stroke Mother 70       TIA  . Cancer Father        prostate cancer  . Cancer Brother        prostate cancer  . Cancer Brother 50       Melanoma  . Mental illness Sister 96       bipolar disorder   Social History:  reports that she quit smoking about 6 years ago. Her  smoking use included cigarettes. She has a 3.00 pack-year smoking history. She has never used smokeless tobacco. She reports current alcohol use. She reports that she does not use drugs.  Allergies:  Allergies  Allergen Reactions  . Hycodan [Hydrocodone-Homatropine] Hives    No medications prior to admission.    No results found for this or any previous visit (from the past 48 hour(s)). No results found.  Review of Systems  Constitutional: Negative for chills and fever.  Genitourinary: Positive for urgency.  All other systems reviewed and are negative.   There were no vitals taken for this visit. Physical Exam  Vitals reviewed. HENT:  Head: Normocephalic.  Nose: Nose normal.  Respiratory: Effort normal.  GI:  Stable mild obesity.   Genitourinary:    Genitourinary Comments: No CVAT   Musculoskeletal:        General: Normal range of motion.     Cervical back: Normal range of motion.  Neurological: She is alert.  Skin: Skin is warm.  Psychiatric: Mood normal.     Assessment/Plan  Proceed as planned with LEFT distal ureteral resection / reimplant and left stent exchange. Risks, benefits, alternatives, expected peri-op course discussed previously and reiterated  today.   Alexis Frock, MD 10/03/2019, 6:52 AM

## 2019-10-03 NOTE — Plan of Care (Signed)
  Problem: Bowel/Gastric: Goal: Gastrointestinal status for postoperative course will improve Outcome: Progressing   Problem: Education: Goal: Knowledge of General Education information will improve Description: Including pain rating scale, medication(s)/side effects and non-pharmacologic comfort measures Outcome: Completed/Met   Problem: Activity: Goal: Risk for activity intolerance will decrease Outcome: Completed/Met   Problem: Pain Managment: Goal: General experience of comfort will improve Outcome: Completed/Met

## 2019-10-03 NOTE — Anesthesia Postprocedure Evaluation (Signed)
Anesthesia Post Note  Patient: [name]  Procedure(s) Performed: XI ROBOTICALLY ASSISTED LAPAROSCOPIC URETERAL RE-IMPLANTATION (Left ) CYSTOSCOPY WITH STENT REPLACEMENT (Left )     Patient location during evaluation: PACU Anesthesia Type: General Level of consciousness: sedated and patient cooperative Pain management: pain level controlled Vital Signs Assessment: post-procedure vital signs reviewed and stable Respiratory status: spontaneous breathing Cardiovascular status: stable Anesthetic complications: no   No complications documented.  Last Vitals:  Vitals:   10/03/19 1521 10/03/19 1530  BP:  116/70  Pulse: 71 75  Resp: (!) 8 (!) 9  Temp:    SpO2: 92% 91%    Last Pain:  Vitals:   10/03/19 1530  TempSrc:   PainSc: 0-No pain                 Nolon Nations

## 2019-10-03 NOTE — Discharge Instructions (Signed)

## 2019-10-03 NOTE — Transfer of Care (Signed)
Immediate Anesthesia Transfer of Care Note  Patient: [name]  Procedure(s) Performed: XI ROBOTICALLY ASSISTED LAPAROSCOPIC URETERAL RE-IMPLANTATION (Left ) CYSTOSCOPY WITH STENT REPLACEMENT (Left )  Patient Location: PACU  Anesthesia Type:General  Level of Consciousness: awake, alert , oriented and patient cooperative  Airway & Oxygen Therapy: Patient Spontanous Breathing and Patient connected to face mask  Post-op Assessment: Report given to RN and Post -op Vital signs reviewed and stable  Post vital signs: Reviewed and stable  Last Vitals:  Vitals Value Taken Time  BP 122/67 10/03/19 1448  Temp    Pulse    Resp 13 10/03/19 1449  SpO2    Vitals shown include unvalidated device data.  Last Pain:  Vitals:   10/03/19 1007  TempSrc: Oral  PainSc:          Complications: No complications documented.

## 2019-10-03 NOTE — Anesthesia Preprocedure Evaluation (Signed)
Anesthesia Evaluation  Patient identified by MRN, date of birth, ID band Patient awake    Reviewed: Allergy & Precautions, NPO status , Patient's Chart, lab work & pertinent test results  History of Anesthesia Complications Negative for: history of anesthetic complications  Airway Mallampati: II  TM Distance: >3 FB Neck ROM: Full    Dental  (+) Dental Advisory Given, Teeth Intact   Pulmonary former smoker,    Pulmonary exam normal breath sounds clear to auscultation       Cardiovascular hypertension, Pt. on medications Normal cardiovascular exam Rhythm:Regular     Neuro/Psych negative neurological ROS  negative psych ROS   GI/Hepatic negative GI ROS, Neg liver ROS,   Endo/Other   Obesity   Renal/GU Renal disease    Bladder tumor     Musculoskeletal negative musculoskeletal ROS (+)   Abdominal   Peds  Hematology negative hematology ROS (+)   Anesthesia Other Findings Covid neg 06/10/19   Reproductive/Obstetrics                            Anesthesia Physical  Anesthesia Plan  ASA: II  Anesthesia Plan: General   Post-op Pain Management:    Induction: Intravenous  PONV Risk Score and Plan: 4 or greater and Treatment may vary due to age or medical condition, Ondansetron and Dexamethasone  Airway Management Planned: Oral ETT  Additional Equipment: None  Intra-op Plan:   Post-operative Plan: Extubation in OR  Informed Consent: I have reviewed the patients History and Physical, chart, labs and discussed the procedure including the risks, benefits and alternatives for the proposed anesthesia with the patient or authorized representative who has indicated his/her understanding and acceptance.     Dental advisory given  Plan Discussed with: CRNA  Anesthesia Plan Comments:         Anesthesia Quick Evaluation

## 2019-10-03 NOTE — Anesthesia Procedure Notes (Signed)
Procedure Name: Intubation Date/Time: 10/03/2019 11:33 AM Performed by: Victoriano Lain, CRNA Pre-anesthesia Checklist: Patient identified, Emergency Drugs available, Suction available, Patient being monitored and Timeout performed Patient Re-evaluated:Patient Re-evaluated prior to induction Oxygen Delivery Method: Circle system utilized Preoxygenation: Pre-oxygenation with 100% oxygen Induction Type: IV induction Ventilation: Mask ventilation without difficulty and Oral airway inserted - appropriate to patient size Laryngoscope Size: Mac and 4 Grade View: Grade I Tube type: Oral Tube size: 7.5 mm Number of attempts: 1 Airway Equipment and Method: Stylet Placement Confirmation: ETT inserted through vocal cords under direct vision,  positive ETCO2 and breath sounds checked- equal and bilateral Secured at: 22 cm Tube secured with: Tape Dental Injury: Teeth and Oropharynx as per pre-operative assessment

## 2019-10-04 ENCOUNTER — Encounter (HOSPITAL_COMMUNITY): Payer: Self-pay | Admitting: Urology

## 2019-10-04 LAB — BASIC METABOLIC PANEL
Anion gap: 10 (ref 5–15)
BUN: 13 mg/dL (ref 8–23)
CO2: 24 mmol/L (ref 22–32)
Calcium: 8.7 mg/dL — ABNORMAL LOW (ref 8.9–10.3)
Chloride: 100 mmol/L (ref 98–111)
Creatinine, Ser: 1.02 mg/dL — ABNORMAL HIGH (ref 0.44–1.00)
GFR calc Af Amer: >60 mL/min (ref >60)
GFR calc non Af Amer: 55 mL/min — ABNORMAL LOW (ref >60)
Glucose, Bld: 156 mg/dL — ABNORMAL HIGH (ref 70–99)
Potassium: 3.8 mmol/L (ref 3.5–5.1)
Sodium: 134 mmol/L — ABNORMAL LOW (ref 135–145)

## 2019-10-04 LAB — HEMOGLOBIN AND HEMATOCRIT, BLOOD
HCT: 34.5 % — ABNORMAL LOW (ref 36.0–46.0)
HCT: 34.7 % — ABNORMAL LOW (ref 36.0–46.0)
Hemoglobin: 11.6 g/dL — ABNORMAL LOW (ref 12.0–15.0)
Hemoglobin: 11.9 g/dL — ABNORMAL LOW (ref 12.0–15.0)

## 2019-10-04 NOTE — Discharge Summary (Signed)
Physician Discharge Summary  Patient ID: [name] MRN: [id number] DOB/AGE: Nov 19, 1945 74 y.o.  Admit date: 10/03/2019 Discharge date: 10/04/2019  Admission Diagnoses:  Discharge Diagnoses:  Active Problems:   Ureteral cancer, left [facility])   Discharged Condition: good  Hospital Course: Patient underwent a robotic assisted laparoscopic left distal ureterectomy with ureteral reimplant with stent placement on 10/03/2019.  She tolerated the procedure well and was stable postoperatively.  Overnight, JP output was documented as 515.  Nursing was unable to confirm this this morning.  However, there was minimal output over several hours so I think this was an incorrect number.  With the low subsequent output, JP was removed.  I rechecked her hemoglobin and hemoglobin was stable.  She was stable for discharge home with Foley catheter.  Consults: None  Significant Diagnostic Studies: None  Treatments: surgery: As above  Discharge Exam: Blood pressure 125/65, pulse 83, temperature 98.8 F (37.1 C), temperature source Oral, resp. rate 18, height 5\' 11"  (1.803 m), weight 98.4 kg, SpO2 94 %. General appearance: alert no acute distress Nonlabored respiration Adequate perfusion of extremities Foley catheter draining clear yellow urine JP with scant output  Disposition: Discharge disposition: 01-Home or Self Care        Allergies as of 10/04/2019      Reactions   Hycodan [hydrocodone-homatropine] Hives      Medication List    TAKE these medications   amLODipine 10 MG tablet Commonly known as: NORVASC Take 1 tablet (10 mg total) by mouth daily.   atorvastatin 20 MG tablet Commonly known as: LIPITOR Take 1 tablet (20 mg total) by mouth daily.   senna-docusate 8.6-50 MG tablet Commonly known as: Senokot-S Take 1 tablet by mouth 2 (two) times daily. While taking strong pain meds to prevent constipation.   sulfamethoxazole-trimethoprim 800-160 MG tablet Commonly known as:  BACTRIM DS Take 1 tablet by mouth 2 (two) times daily. X 3 days. Begin day before next Urology appointment.   traMADol 50 MG tablet Commonly known as: Ultram Take 1-2 tablets (50-100 mg total) by mouth every 6 (six) hours as needed for moderate pain or severe pain. Post-operatively   triamterene-hydrochlorothiazide 37.5-25 MG tablet Commonly known as: MAXZIDE-25 Take 1 tablet by mouth daily.       Follow-up Information    Alexis Frock, MD On 10/14/2019.   Specialty: Urology Why: at 9:15 for office catheter removal and MD visit.  Contact information: [place] [address] [phone]               Signed: Marton Redwood, III 10/04/2019, 1:01 PM

## 2019-10-04 NOTE — Op Note (Signed)
NAME: Alexis, Barton MEDICAL RECORD [id number] ACCOUNT [id number] DATE OF BIRTH:1945-09-14 FACILITY: [facility] LOCATION: [facility] PHYSICIAN:Dominque Levandowski, MD  OPERATIVE REPORT  DATE OF PROCEDURE:  10/03/2019  PREOPERATIVE DIAGNOSIS:  Left distal ureteral cancer, low grade.  PROCEDURE: 1.  Cystoscopy, left retrograde pyelogram interpretation. 2.  Exchange left ureteral stent 6 x 26 contour, no tether. 3.  Robotic-assisted laparoscopic left distal ureterectomy with psoas hitch reimplantation.  ESTIMATED BLOOD LOSS:  100 mL.  MEDICATIONS:  None.  SPECIMENS:  Left distal ureter proximal and inked margin.  FINDINGS: 1.  No recurrent tumors in the urinary bladder. 2.  Left distal ureteral tumor extending approximately 3 cm above the ureterovesical junction as anticipated.  ASSISTANT:  Carmie Kanner, MD  DRAINS:   1.  Jackson-Pratt drain to bulb suction. 2.  Foley catheter to straight drain.  INDICATIONS:  The patient is a very pleasant 74 year old woman have known for quite some time with a longstanding history of recurrent low-grade bladder cancer,  sometimes being very large volume.  She has the past several years has been very compliant  with followup.  She was found to have a local recurrence, underwent transurethral resection with retrogrades several months ago.  She was noted at that time to have a new left distal ureteral filling defect.  Diagnostic ureteroscopy corroborated very  large volume distal ureteral cancer, but all disease, well below the area of the iliacs occur diseased and reliably low-grade in the past.  There is no disease proximal to this.  Options were discussed for management including curative versus noncurative  options for distal ureteral portion of cancer and she wished to proceed with the left distal ureterectomy with reimplantation.  Informed consent was obtained and placed in medical record.  DESCRIPTION OF PROCEDURE:  The patient was identified  and procedure identified being left distal ureterectomy stent exchange was confirmed. Timeout was performed.  Intravenous administered.  General endotracheal anesthesia introduced.  The patient was  placed into a low lithotomy position, sterile field was created prepped and draped the patient's vagina, introitus and proximal thighs using iodine and her infra-xiphoid abdomen using chlorhexidine gluconate after she was further fastened to operative  table using 3-inch tape over foam padding across the supraxiphoid chest.  Her arms were tucked at her side using gel roll.  A test to steep Trendelenburg positioning was performed and found to be suitably positioned.  She was then made level again.   Initial attention was directed exchange of her left ureteral stent.  Cystourethroscopy was performed using 21-French rigid cystoscope vessel lens.  Inspection of bladder revealed no diverticula, calcifications, papillary lesions.  The left ureteral  orifice was easily seen.  There were no obvious papillary tumors at the orifice distal end of the stent was seen in situ.  It was grasped, brought to the level of the urethral meatus.  A 0.038 ZIPwire was advanced to lower pole exchanged for open-ended  catheter and left retrograde pyelogram was obtained.  Left retrograde pyelogram does a single left ureter single system left kidney.  There were multifocal filling defects in the distal most ureter only.  ZIPwire was once again advanced and a new 6 x 26 contour Polaris-type stent was placed using  fluoroscopic guidance.  Good pulses at this point were noted.  A 3-way type Foley catheter was placed free to straight drain.  Irrigation port plugged.  Next, a high-flow, low-pressure pneumoperitoneum was obtained using Veress needle technique in the  supraumbilical midline having passed the aspiration and  drop test.  An 8 mm robotic camera port was placed in same location.  Laparoscopic examination peritoneal cavity revealed no  significant adhesions, no visceral injury.  Distal ports were placed as  follows:  Right paramedian 8 mm robotic port, right far lateral 12 mm AirSeal assist port, right paramedian 5 mm suction port, left paramedian 8 mm robotic port, left far lateral 8 mm robotic port.  Robot was docked and passed electronic checks.  Initial  attention was directed at development of the left retroperitoneum.  Incision was made lateral to the left medial umbilical ligament from the anterior abdominal wall coursing just lateral to this following the iliac vessels and then carried to a  continuous retroperitoneal incision lateral to the gonadal vessels and the descending colon, thus creating a very large retroperitoneal flap to retract the bowel medially.  The left ureter was encountered as it coursed over the iliac vessels, marked a  vessel loop, dissected proximally a distance of approximately 10 cm above the iliac crossing.  Again, the ureter was of normal caliber without any obvious desmoplastic reaction around it in this area.  The ureter was then dissected distally towards the  area of the ureterovesical junction.  There was significant desmoplastic reaction and widening of the ureter consistent with known intraluminal tumor.  As we approached the area of the bladder, the total involved segment appeared to be at least 3 cm  above the ureterovesical junction.  At the ureterovesical junction, a small cystotomy was purposely made very near the ureteral orifice of the medial location and using direct vision.  The ureteral orifice was completely resected.  The small cystotomy  was closed using running 3-0 V-Loc, which revealed an excellent reapposition of the small cystotomy and prior left ureteral orifice area.  This was filled to 200 mL of saline and found to be watertight.  The distal ureteral segment was grossly involved  with the tumor was then excised and set aside from pathology was placed in EndoCatch bag in the  proximal ureter spatulated for a distance of approximately 1.5 cm.  Attention was then directed at right ureteral reimplantation.  The space of Retzius was  further of all further developed thus dropping the bladder away from the anterior abdominal wall and allowing it to swing towards the left side, laying and the geometry to clearly appeared that a psoas hitch would be necessary to help in the care of the  bladder and as such superior perivesical tissue was anchored to the left psoas tendon using a 2-0 PDS x2, taking care to the tendon directly into the tendon parallel and avoid deep bites within the psoas musculature and resulted in excellent positioning  of the bladder superiorly into the left.  The bladder was then filled to 200 mL and the presumed area of her reimplantation was identified at the dome of the bladder and the geometry was favorable without tension.  A small cystotomy was made using cold  scissors approximately 1 cm in length and a heel stitch was applied from the area of the heel of the spatulated ureter to a small cystotomy, using 4-0 Vicryl.  Next ureteroenteric to bladder reimplantation anastomosis was performed using 2 separate  running suture lines of 4-0 Vicryl.  This resulted in excellent tension-free apposition.  Hemostasis was excellent.  Sponge, needle counts were correct.  There were no obvious extravasation of urine.  We achieved the goals of the surgery today.  A closed  suction drain was brought onto  the previous left lateral most robotic port site near the peritoneal cavity.  The specimen was not of very large diameter.  As such, 0 Vicryl was used to preplace fascial apposition sutures of the previous right 12 mm  lateral assistant port site and the specimen was retrieved via this site and the fascia closed with the preplaced sutures.  All incision sites were infiltrated with dilute lipolyzed Marcaine and closed level skin using subcuticular Monocryl by Dermabond.   Drain  stitch was applied.  Procedure was terminated.  The patient tolerated the procedure well.  No immediate complications.  The patient was taken to postanesthesia care in stable condition.  Please note, first assistant, Carmie Kanner, MD was crucial for all portions of surgery today.  She provided invaluable retraction, specimen manipulation, clipping, retraction and assistance.  PN/NUANCE  D:10/03/2019 T:10/04/2019 JOB:[id number]

## 2019-10-07 LAB — SURGICAL PATHOLOGY

## 2019-11-10 DIAGNOSIS — Z1231 Encounter for screening mammogram for malignant neoplasm of breast: Secondary | ICD-10-CM | POA: Diagnosis not present

## 2019-11-13 ENCOUNTER — Encounter: Payer: Self-pay | Admitting: Family Medicine

## 2019-11-13 DIAGNOSIS — N6001 Solitary cyst of right breast: Secondary | ICD-10-CM | POA: Diagnosis not present

## 2019-11-13 DIAGNOSIS — R922 Inconclusive mammogram: Secondary | ICD-10-CM | POA: Diagnosis not present

## 2019-11-13 LAB — HM MAMMOGRAPHY

## 2019-11-20 ENCOUNTER — Encounter: Payer: Self-pay | Admitting: Family Medicine

## 2019-11-24 DIAGNOSIS — C662 Malignant neoplasm of left ureter: Secondary | ICD-10-CM | POA: Diagnosis not present

## 2020-02-23 DIAGNOSIS — C678 Malignant neoplasm of overlapping sites of bladder: Secondary | ICD-10-CM | POA: Diagnosis not present

## 2020-02-23 DIAGNOSIS — C662 Malignant neoplasm of left ureter: Secondary | ICD-10-CM | POA: Diagnosis not present

## 2020-03-30 ENCOUNTER — Other Ambulatory Visit: Payer: Self-pay | Admitting: Family Medicine

## 2020-03-30 MED ORDER — TRIAMTERENE-HCTZ 37.5-25 MG PO TABS: 1.0000 | ORAL_TABLET | Freq: Every day | ORAL | 0 refills | Status: DC

## 2020-03-30 NOTE — Telephone Encounter (Signed)
   Notes to clinic:  Medication last filled by Dr. Pamella Pert and patient needs office visit    Requested Prescriptions  Pending Prescriptions Disp Refills   triamterene-hydrochlorothiazide (MAXZIDE-25) 37.5-25 MG tablet 90 tablet 0    Sig: Take 1 tablet by mouth daily.      Cardiovascular: Diuretic Combos Failed - 03/30/2020 10:05 AM      Failed - Na in normal range and within 360 days    Sodium  Date Value Ref Range Status  10/04/2019 134 (L) 135 - 145 mmol/L Final  03/31/2019 140 134 - 144 mmol/L Final          Failed - Cr in normal range and within 360 days    Creat  Date Value Ref Range Status  12/28/2015 0.86 0.50 - 0.99 mg/dL Final    Comment:      For patients > or = 73 years of age: The upper reference limit for Creatinine is approximately 13% higher for people identified as African-American.      Creatinine, Ser  Date Value Ref Range Status  10/04/2019 1.02 (H) 0.44 - 1.00 mg/dL Final          Failed - Ca in normal range and within 360 days    Calcium  Date Value Ref Range Status  10/04/2019 8.7 (L) 8.9 - 10.3 mg/dL Final   Calcium, Ion  Date Value Ref Range Status  06/13/2019 1.22 1.15 - 1.40 mmol/L Final          Failed - Valid encounter within last 6 months    Recent Outpatient Visits           6 months ago Essential hypertension, benign   Primary Care at [facility], Lilia Argue, MD   1 year ago Encounter for Medicare annual wellness exam   Primary Care at [facility], Lilia Argue, MD   1 year ago Pure hypercholesterolemia   Primary Care at [facility], Lilia Argue, MD   1 year ago Lower respiratory tract infection   Primary Care at [facility], Lilia Argue, MD   1 year ago Cough   Primary Care at [facility], Lilia Argue, MD       Future Appointments             In 2 weeks Lavonna Monarch, MD [facility] [place] - K in normal range and within 360 days    Potassium  Date  Value Ref Range Status  10/04/2019 3.8 3.5 - 5.1 mmol/L Final          Passed - Last BP in normal range    BP Readings from Last 1 Encounters:  10/04/19 125/65

## 2020-03-30 NOTE — Telephone Encounter (Signed)
No further refills without office visit 

## 2020-03-30 NOTE — Telephone Encounter (Signed)
12/21/20121 - PATIENT REQUESTING A REFILL ON HER TRIAMTERENE-HYDROCHLOROTHIAZIDE. I HAVE SCHEDULED HER FOR AN OFFICE VISIT WITH KELSEA ON TUES. 04/20/2020. HER TRANSFER OF CARE HAS ALSO BEEN SCHEDULED FOR 07/05/2020 WITH KELSEA. [name]. HAS SENT A 30 DAY COURTESY REFILL. I DO NOT HAVE TO ROUTE AT THIS TIME. [place]

## 2020-03-30 NOTE — Telephone Encounter (Signed)
Please schedule patient a toc and or f/u appt for med refills. 3o day has been sent

## 2020-03-30 NOTE — Telephone Encounter (Signed)
Medication Refill - Medication: triamterene-hydrochlorothiazide (MAXZIDE-25) 37.5-25 MG tablet     Preferred Pharmacy (with phone number or street name):  [id number] - [place], [address] Phone:  [phone]  Fax:  [phone]       Agent: Please be advised that RX refills may take up to 3 business days. We ask that you follow-up with your pharmacy.

## 2020-04-06 ENCOUNTER — Telehealth: Payer: Self-pay | Admitting: *Deleted

## 2020-04-06 NOTE — Telephone Encounter (Signed)
Pt called back. Please advise.

## 2020-04-06 NOTE — Telephone Encounter (Signed)
Schedule AWV.  

## 2020-04-08 ENCOUNTER — Encounter: Payer: Self-pay | Admitting: Family Medicine

## 2020-04-12 ENCOUNTER — Ambulatory Visit: Payer: Self-pay | Admitting: Family Medicine

## 2020-04-12 VITALS — Ht 71.0 in | Wt 206.0 lb

## 2020-04-12 DIAGNOSIS — Z0000 Encounter for general adult medical examination without abnormal findings: Secondary | ICD-10-CM

## 2020-04-12 NOTE — Patient Instructions (Addendum)
Thank you for taking time to come for your Medicare Wellness Visit. I appreciate your ongoing commitment to your health goals. Please review the following plan we discussed and let me know if I can assist you in the future.  Deke Tilghman LPN  Preventive Care 73 Years and Older, Female Preventive care refers to lifestyle choices and visits with your health care provider that can promote health and wellness. This includes:  A yearly physical exam. This is also called an annual well check.  Regular dental and eye exams.  Immunizations.  Screening for certain conditions.  Healthy lifestyle choices, such as diet and exercise. What can I expect for my preventive care visit? Physical exam Your health care provider will check:  Height and weight. These may be used to calculate body mass index (BMI), which is a measurement that tells if you are at a healthy weight.  Heart rate and blood pressure.  Your skin for abnormal spots. Counseling Your health care provider may ask you questions about:  Alcohol, tobacco, and drug use.  Emotional well-being.  Home and relationship well-being.  Sexual activity.  Eating habits.  History of falls.  Memory and ability to understand (cognition).  Work and work environment.  Pregnancy and menstrual history. What immunizations do I need?  Influenza (flu) vaccine  This is recommended every year. Tetanus, diphtheria, and pertussis (Tdap) vaccine  You may need a Td booster every 10 years. Varicella (chickenpox) vaccine  You may need this vaccine if you have not already been vaccinated. Zoster (shingles) vaccine  You may need this after age 73. Pneumococcal conjugate (PCV13) vaccine  One dose is recommended after age 65. Pneumococcal polysaccharide (PPSV23) vaccine  One dose is recommended after age 65. Measles, mumps, and rubella (MMR) vaccine  You may need at least one dose of MMR if you were born in 1957 or later. You may also  need a second dose. Meningococcal conjugate (MenACWY) vaccine  You may need this if you have certain conditions. Hepatitis A vaccine  You may need this if you have certain conditions or if you travel or work in places where you may be exposed to hepatitis A. Hepatitis B vaccine  You may need this if you have certain conditions or if you travel or work in places where you may be exposed to hepatitis B. Haemophilus influenzae type b (Hib) vaccine  You may need this if you have certain conditions. You may receive vaccines as individual doses or as more than one vaccine together in one shot (combination vaccines). Talk with your health care provider about the risks and benefits of combination vaccines. What tests do I need? Blood tests  Lipid and cholesterol levels. These may be checked every 5 years, or more frequently depending on your overall health.  Hepatitis C test.  Hepatitis B test. Screening  Lung cancer screening. You may have this screening every year starting at age 73 if you have a 30-pack-year history of smoking and currently smoke or have quit within the past 15 years.  Colorectal cancer screening. All adults should have this screening starting at age 73 and continuing until age 75. Your health care provider may recommend screening at age 45 if you are at increased risk. You will have tests every 1-10 years, depending on your results and the type of screening test.  Diabetes screening. This is done by checking your blood sugar (glucose) after you have not eaten for a while (fasting). You may have this done every 1-3   years.  Mammogram. This may be done every 1-2 years. Talk with your health care provider about how often you should have regular mammograms.  BRCA-related cancer screening. This may be done if you have a family history of breast, ovarian, tubal, or peritoneal cancers. Other tests  Sexually transmitted disease (STD) testing.  Bone density scan. This is done  to screen for osteoporosis. You may have this done starting at age 73. Follow these instructions at home: Eating and drinking  Eat a diet that includes fresh fruits and vegetables, whole grains, lean protein, and low-fat dairy products. Limit your intake of foods with high amounts of sugar, saturated fats, and salt.  Take vitamin and mineral supplements as recommended by your health care provider.  Do not drink alcohol if your health care provider tells you not to drink.  If you drink alcohol: ? Limit how much you have to 0-1 drink a day. ? Be aware of how much alcohol is in your drink. In the [place] one drink equals one 12 oz bottle of beer (355 mL), one 5 oz glass of wine (148 mL), or one 1 oz glass of hard liquor (44 mL). Lifestyle  Take daily care of your teeth and gums.  Stay active. Exercise for at least 30 minutes on 5 or more days each week.  Do not use any products that contain nicotine or tobacco, such as cigarettes, e-cigarettes, and chewing tobacco. If you need help quitting, ask your health care provider.  If you are sexually active, practice safe sex. Use a condom or other form of protection in order to prevent STIs (sexually transmitted infections).  Talk with your health care provider about taking a low-dose aspirin or statin. What's next?  Go to your health care provider once a year for a well check visit.  Ask your health care provider how often you should have your eyes and teeth checked.  Stay up to date on all vaccines. This information is not intended to replace advice given to you by your health care provider. Make sure you discuss any questions you have with your health care provider. Document Revised: 03/21/2018 Document Reviewed: 03/21/2018 Elsevier Patient Education  2020 [employer].

## 2020-04-12 NOTE — Progress Notes (Signed)
Presents today for [employer] Visit   Date of last exam:09-22-2019   Interpreter used for this visit? No  I connected with  [name] on 04/12/20 by a telephone and verified that I am speaking with the correct person using two identifiers.   I discussed the limitations of evaluation and management by telemedicine. The patient expressed understanding and agreed to proceed.  Patient location: home  Provider location: in office  I provided 20 minutes of non face - to - face time during this encounter.  Patient Care Team: Patient, No Pcp Per as PCP - General (General Practice) Graylin Shiver, MD as Consulting Physician (Gastroenterology) Sebastian Ache, MD as Consulting Physician (Urology)   Other items to address today:   Discussed Eye/Dental Discussed Immunizations Follow up scheduled Just 1/11 @ 10:00 am   Other Screening: Last screening for diabetes: 03/31/2019 Last lipid screening: 03/31/2019  ADVANCE DIRECTIVES: Discussed:no On File: yes Materials Provided:no  Immunization status:  Immunization History  Administered Date(s) Administered  . Fluad Quad(high Dose 65+) 03/30/2019, 03/31/2019  . Influenza, High Dose Seasonal PF 01/02/2018  . Influenza,inj,Quad PF,6+ Mos 03/02/2014, 12/25/2014, 12/28/2015, 03/21/2017  . PFIZER SARS-COV-2 Vaccination 01/19/2019  . Pneumococcal Conjugate-13 03/02/2014  . Pneumococcal Polysaccharide-23 12/25/2014     Health Maintenance Due  Topic Date Due  . TETANUS/TDAP  Never done  . COVID-19 Vaccine (2 - Pfizer risk 4-dose series) 02/09/2019  . INFLUENZA VACCINE  11/09/2019     Functional Status Survey: Is the patient deaf or have difficulty hearing?: No Does the patient have difficulty seeing, even when wearing glasses/contacts?: No Does the patient have difficulty concentrating, remembering, or making decisions?: No Does the patient have difficulty walking or climbing stairs?: No Does the  patient have difficulty dressing or bathing?: No Does the patient have difficulty doing errands alone such as visiting a doctor's office or shopping?: No   6CIT Screen 04/12/2020 03/31/2019 03/31/2019 03/26/2018  What Year? 0 points 0 points 0 points 0 points  What month? 0 points 0 points 0 points 0 points  What time? 0 points 0 points 0 points 0 points  Count back from 20 0 points 0 points - 0 points  Months in reverse 0 points 0 points - 0 points  Repeat phrase 0 points 0 points - 0 points  Total Score 0 0 - 0      Flowsheet Row Clinical Support from 04/12/2020 in [facility]  AUDIT-C Score 6       Home Environment:   No trouble climbing stairs Lives in a one story home  No scattered rugs No grab bars Adequate lighting/ no clutter    Patient Active Problem List   Diagnosis Date Noted  . 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 Essential hypertension, benign 09/22/2019  . Class 1 obesity due to excess calories with serious comorbidity and body mass index (BMI) of 32.0 to 32.9 in adult 03/27/2017  . Family history of breast cancer in sister 03/21/2017  . Family history of melanoma 03/21/2017  . Pure hypercholesterolemia 12/25/2014  . Bladder cancer (HCC) 09/29/2013     Past Medical History:  Diagnosis Date  . Bladder cancer (HCC) RECURRENT    UROLOGIST-  DR [facility]  . Duplex kidney   . Duplex kidney    RIGHT  . History of adenomatous polyp of colon   . History of recurrent UTIs   . Hyperlipidemia   . Hypertension   . Wears contact  lenses      Past Surgical History:  Procedure Laterality Date  . BENIGN BREAST BX Right 1995  . BUNIONECTOMY Right 2000  . bunionectomy left foot  04/2016  . COLONOSCOPY W/ POLYPECTOMY  01/06/2013   single sessile polyp ascending colon.  [facility].  Marland Kitchen CYSTOSCOPY W/ RETROGRADES Bilateral 12/10/2013   Procedure: RETROGRADE PYELOGRAM  ;  Surgeon: Alexis Frock, MD;  Location: [facility];  Service: Urology;   Laterality: Bilateral;  . CYSTOSCOPY W/ RETROGRADES Bilateral 07/15/2014   Procedure: CYSTOSCOPY WITH RETROGRADE PYELOGRAM AND INSTILLATION OF MITOMYCIN C;  Surgeon: Alexis Frock, MD;  Location: [facility];  Service: Urology;  Laterality: Bilateral;  . CYSTOSCOPY W/ RETROGRADES Bilateral 12/31/2015   Procedure: CYSTOSCOPY WITH RETROGRADE PYELOGRAM;  Surgeon: Alexis Frock, MD;  Location: [facility];  Service: Urology;  Laterality: Bilateral;  . CYSTOSCOPY W/ RETROGRADES Bilateral 11/17/2016   Procedure: CYSTOSCOPY WITH RETROGRADE PYELOGRAM;  Surgeon: Alexis Frock, MD;  Location: [facility];  Service: Urology;  Laterality: Bilateral;  . CYSTOSCOPY W/ RETROGRADES Bilateral 06/13/2019   Procedure: CYSTOSCOPY WITH RETROGRADE PYELOGRAM;  Surgeon: Alexis Frock, MD;  Location: [facility];  Service: Urology;  Laterality: Bilateral;  . CYSTOSCOPY W/ URETERAL STENT PLACEMENT Bilateral 10/22/2013   Procedure: CYSTOSCOPY WITH RETROGRADE PYELOGRAM;  Surgeon: Alexis Frock, MD;  Location: [facility];  Service: Urology;  Laterality: Bilateral;  . CYSTOSCOPY W/ URETERAL STENT PLACEMENT Left 10/03/2019   Procedure: CYSTOSCOPY WITH STENT REPLACEMENT;  Surgeon: Alexis Frock, MD;  Location: [facility];  Service: Urology;  Laterality: Left;  . CYSTOSCOPY WITH RETROGRADE PYELOGRAM, URETEROSCOPY AND STENT PLACEMENT Bilateral 04/14/2015   Procedure: CYSTOSCOPY WITH RETROGRADE PYELOGRAM, URETEROSCOPY AND RIGHT STENT PLACEMENT;  Surgeon: Alexis Frock, MD;  Location: [facility];  Service: Urology;  Laterality: Bilateral;  . TONSILLECTOMY  9   (age 60)  . TRANSURETHRAL RESECTION OF BLADDER TUMOR N/A 12/31/2015   Procedure: TRANSURETHRAL RESECTION OF BLADDER TUMOR (TURBT) WITH GYRUS;  Surgeon: Alexis Frock, MD;  Location: [facility];  Service: Urology;  Laterality: N/A;  . TRANSURETHRAL RESECTION OF BLADDER TUMOR  N/A 11/17/2016   Procedure: TRANSURETHRAL RESECTION OF BLADDER TUMOR (TURBT);  Surgeon: Alexis Frock, MD;  Location: [facility];  Service: Urology;  Laterality: N/A;  . TRANSURETHRAL RESECTION OF BLADDER TUMOR N/A 06/13/2019   Procedure: TRANSURETHRAL RESECTION OF BLADDER TUMOR (TURBT). LEFT DIAGNOSTIC URETEROSCOPY AND LEFT STENT PLACEMENT;  Surgeon: Alexis Frock, MD;  Location: [facility];  Service: Urology;  Laterality: N/A;  40 MINS  . TRANSURETHRAL RESECTION OF BLADDER TUMOR WITH GYRUS (TURBT-GYRUS) N/A 10/22/2013   Procedure: TRANSURETHRAL RESECTION OF BLADDER TUMOR WITH ERBE.;  Surgeon: Alexis Frock, MD;  Location: [facility];  Service: Urology;  Laterality: N/A;  . TRANSURETHRAL RESECTION OF BLADDER TUMOR WITH GYRUS (TURBT-GYRUS) N/A 12/10/2013   Procedure: RESTAGING TRANSURETHRAL RESECTION OF BLADDER TUMOR WITH GYRUS (TURBT-GYRUS);  Surgeon: Alexis Frock, MD;  Location: [facility];  Service: Urology;  Laterality: N/A;  . TRANSURETHRAL RESECTION OF BLADDER TUMOR WITH GYRUS (TURBT-GYRUS) N/A 07/15/2014   Procedure: TRANSURETHRAL RESECTION OF BLADDER TUMOR WITH GYRUS (TURBT-GYRUS);  Surgeon: Alexis Frock, MD;  Location: [facility];  Service: Urology;  Laterality: N/A;  . TRANSURETHRAL RESECTION OF BLADDER TUMOR WITH GYRUS (TURBT-GYRUS) N/A 04/14/2015   Procedure: TRANSURETHRAL RESECTION OF BLADDER TUMOR WITH GYRUS (TURBT-GYRUS);  Surgeon: Alexis Frock, MD;  Location: [facility];  Service: Urology;  Laterality: N/A;     Family History  Problem Relation Age of Onset  . Hypertension Mother   . Arthritis Mother        knee replacement; shoulder replacement  . Heart disease Mother        AAA; mild AMI in 2018  . Stroke Mother 33       TIA  . Cancer Father        prostate cancer  . Cancer Brother        prostate cancer  . Cancer Brother 50       Melanoma  . Mental illness Sister 2        bipolar disorder     Social History   Socioeconomic History  . Marital status: Divorced    Spouse name: Not on file  . Number of children: 3  . Years of education: Not on file  . Highest education level: Not on file  Occupational History  . 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  Tobacco Use  . Smoking status: Former Smoker    Packs/day: 0.15    Years: 20.00    Pack years: 3.00    Types: Cigarettes    Quit date: 10/17/2012    Years since quitting: 7.4  . Smokeless tobacco: Never Used  Vaping Use  . Vaping Use: Never used  Substance and Sexual Activity  . Alcohol use: Yes    Comment: OCCASIONAL  . Drug use: No  . Sexual activity: Not Currently    Birth control/protection: Post-menopausal  Other Topics Concern  . Not on file  Social History Narrative   Marital status:  Divorced; not dating in 2018.      Children:  3 children; 2 grandchildren (5,4); family within walking distance.        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      Tobacco:  None; quit in 09/2012.      Alcohol: 1 drink per week.       Drugs:  None      Exercise:  One hour of water aerobics 3-4 days per week.      Seatbelt: 100%      Guns:  none      ADLs: independent with all ADLs; drives; no assistant device.      Advanced Directives:  Yes; FULL CODE; no prolonged measures.   Social Determinants of Health   Financial Resource Strain: Not on file  Food Insecurity: Not on file  Transportation Needs: Not on file  Physical Activity: Not on file  Stress: Not on file  Social Connections: Not on file  Intimate Partner Violence: Not on file     Allergies  Allergen Reactions  . Hycodan [Hydrocodone-Homatropine] Hives     Prior to Admission medications   Medication Sig Start Date End Date Taking? Authorizing Provider  amLODipine (NORVASC) 10 MG tablet Take 1 tablet (10 mg total) by  mouth daily. 09/22/19  Yes Jacelyn Pi, Lilia Argue, MD  atorvastatin (LIPITOR) 20 MG tablet Take 1 tablet (20 mg total) by mouth daily. 09/22/19  Yes Jacelyn Pi, Lilia Argue, MD  triamterene-hydrochlorothiazide (MAXZIDE-25) 37.5-25 MG tablet Take 1 tablet by mouth daily. 03/30/20   Maximiano Coss, NP     Depression screen [facility] 2/9 04/12/2020 03/31/2019 10/22/2018 05/17/2018 04/13/2018  Decreased Interest 0 0 0 0 0  Down, Depressed, Hopeless 0 0 0 0 0  PHQ - 2 Score 0 0 0 0 0     Fall Risk  04/12/2020 03/31/2019 10/22/2018 05/17/2018 04/13/2018  Falls in the past year? 0 0 1 0 0  Number falls in past yr: 0 0 0 0 -  Injury with Fall? 0 0 1 0 -  Comment - - wears boot on left foot - -  Follow up Falls evaluation completed;Education provided - - Falls evaluation completed -      PHYSICAL EXAM: Ht 5\' 11"  (1.803 m)   Wt 206 lb (93.4 kg)   BMI 28.73 kg/m    Wt Readings from Last 3 Encounters:  04/12/20 206 lb (93.4 kg)  10/03/19 217 lb (98.4 kg)  09/23/19 217 lb (98.4 kg)       Education/Counseling provided regarding diet and exercise, prevention of chronic diseases, smoking/tobacco cessation, if applicable, and reviewed "Covered Medicare Preventive Services."

## 2020-04-19 ENCOUNTER — Ambulatory Visit: Payer: Medicare Other | Admitting: Dermatology

## 2020-04-19 ENCOUNTER — Other Ambulatory Visit: Payer: Self-pay

## 2020-04-19 ENCOUNTER — Encounter: Payer: Self-pay | Admitting: Dermatology

## 2020-04-19 DIAGNOSIS — L570 Actinic keratosis: Secondary | ICD-10-CM

## 2020-04-19 DIAGNOSIS — L821 Other seborrheic keratosis: Secondary | ICD-10-CM

## 2020-04-19 DIAGNOSIS — Z1283 Encounter for screening for malignant neoplasm of skin: Secondary | ICD-10-CM

## 2020-04-19 DIAGNOSIS — L905 Scar conditions and fibrosis of skin: Secondary | ICD-10-CM | POA: Diagnosis not present

## 2020-04-20 ENCOUNTER — Encounter: Payer: Self-pay | Admitting: Family Medicine

## 2020-04-20 ENCOUNTER — Other Ambulatory Visit: Payer: Self-pay

## 2020-04-20 ENCOUNTER — Ambulatory Visit (INDEPENDENT_AMBULATORY_CARE_PROVIDER_SITE_OTHER): Payer: Medicare Other | Admitting: Family Medicine

## 2020-04-20 ENCOUNTER — Encounter: Payer: Self-pay | Admitting: Dermatology

## 2020-04-20 VITALS — BP 144/84 | HR 78 | Temp 98.6°F | Ht 71.0 in | Wt 216.0 lb

## 2020-04-20 DIAGNOSIS — D649 Anemia, unspecified: Secondary | ICD-10-CM | POA: Diagnosis not present

## 2020-04-20 DIAGNOSIS — I10 Essential (primary) hypertension: Secondary | ICD-10-CM

## 2020-04-20 DIAGNOSIS — Z23 Encounter for immunization: Secondary | ICD-10-CM

## 2020-04-20 DIAGNOSIS — E7800 Pure hypercholesterolemia, unspecified: Secondary | ICD-10-CM | POA: Diagnosis not present

## 2020-04-20 MED ORDER — TRIAMTERENE-HCTZ 37.5-25 MG PO TABS: 1.0000 | ORAL_TABLET | Freq: Every day | ORAL | 2 refills | Status: DC

## 2020-04-20 NOTE — Patient Instructions (Addendum)
  Health Maintenance After Age 73 After age 73, you are at a higher risk for certain long-term diseases and infections as well as injuries from falls. Falls are a major cause of broken bones and head injuries in people who are older than age 73. Getting regular preventive care can help to keep you healthy and well. Preventive care includes getting regular testing and making lifestyle changes as recommended by your health care provider. Talk with your health care provider about:  Which screenings and tests you should have. A screening is a test that checks for a disease when you have no symptoms.  A diet and exercise plan that is right for you. What should I know about screenings and tests to prevent falls? Screening and testing are the best ways to find a health problem early. Early diagnosis and treatment give you the best chance of managing medical conditions that are common after age 73. Certain conditions and lifestyle choices may make you more likely to have a fall. Your health care provider may recommend:  Regular vision checks. Poor vision and conditions such as cataracts can make you more likely to have a fall. If you wear glasses, make sure to get your prescription updated if your vision changes.  Medicine review. Work with your health care provider to regularly review all of the medicines you are taking, including over-the-counter medicines. Ask your health care provider about any side effects that may make you more likely to have a fall. Tell your health care provider if any medicines that you take make you feel dizzy or sleepy.  Osteoporosis screening. Osteoporosis is a condition that causes the bones to get weaker. This can make the bones weak and cause them to break more easily.  Blood pressure screening. Blood pressure changes and medicines to control blood pressure can make you feel dizzy.  Strength and balance checks. Your health care provider may recommend certain tests to  check your strength and balance while standing, walking, or changing positions.  Foot health exam. Foot pain and numbness, as well as not wearing proper footwear, can make you more likely to have a fall.  Depression screening. You may be more likely to have a fall if you have a fear of falling, feel emotionally low, or feel unable to do activities that you used to do.  Alcohol use screening. Using too much alcohol can affect your balance and may make you more likely to have a fall. What actions can I take to lower my risk of falls? General instructions  Talk with your health care provider about your risks for falling. Tell your health care provider if: ? You fall. Be sure to tell your health care provider about all falls, even ones that seem minor. ? You feel dizzy, sleepy, or off-balance.  Take over-the-counter and prescription medicines only as told by your health care provider. These include any supplements.  Eat a healthy diet and maintain a healthy weight. A healthy diet includes low-fat dairy products, low-fat (lean) meats, and fiber from whole grains, beans, and lots of fruits and vegetables. Home safety  Remove any tripping hazards, such as rugs, cords, and clutter.  Install safety equipment such as grab bars in bathrooms and safety rails on stairs.  Keep rooms and walkways well-lit. Activity  Follow a regular exercise program to stay fit. This will help you maintain your balance. Ask your health care provider what types of exercise are appropriate for you.  If you need a cane   or walker, use it as recommended by your health care provider.  Wear supportive shoes that have nonskid soles.   Lifestyle  Do not drink alcohol if your health care provider tells you not to drink.  If you drink alcohol, limit how much you have: ? 0-1 drink a day for women. ? 0-2 drinks a day for men.  Be aware of how much alcohol is in your drink. In the [place] one drink equals one typical bottle  of beer (12 oz), one-half glass of wine (5 oz), or one shot of hard liquor (1 oz).  Do not use any products that contain nicotine or tobacco, such as cigarettes and e-cigarettes. If you need help quitting, ask your health care provider. Summary  Having a healthy lifestyle and getting preventive care can help to protect your health and wellness after age 73.  Screening and testing are the best way to find a health problem early and help you avoid having a fall. Early diagnosis and treatment give you the best chance for managing medical conditions that are more common for people who are older than age 73.  Falls are a major cause of broken bones and head injuries in people who are older than age 73. Take precautions to prevent a fall at home.  Work with your health care provider to learn what changes you can make to improve your health and wellness and to prevent falls. This information is not intended to replace advice given to you by your health care provider. Make sure you discuss any questions you have with your health care provider. Document Revised: 07/18/2018 Document Reviewed: 02/07/2017 Elsevier Patient Education  2021 Elsevier Inc.   If you have lab work done today you will be contacted with your lab results within the next 2 weeks.  If you have not heard from us then please contact us. The fastest way to get your results is to register for My Chart.   IF you received an x-ray today, you will receive an invoice from [facility]. Please contact [facility] at [phone] with questions or concerns regarding your invoice.   IF you received labwork today, you will receive an invoice from [facility]. Please contact [facility] at [phone] with questions or concerns regarding your invoice.   Our billing staff will not be able to assist you with questions regarding bills from these companies.  You will be contacted with the lab results as soon as they are available.  The fastest way to get your results is to activate your My Chart account. Instructions are located on the last page of this paperwork. If you have not heard from us regarding the results in 2 weeks, please contact this office.      

## 2020-04-20 NOTE — Progress Notes (Signed)
1/11/202210:56 AM  [name] 14-Oct-1945, 75 y.o., female [id number]  Chief Complaint  Patient presents with  . Hypertension    Medication refills 90 day supplies  . Hyperlipidemia    HPI:   Patient is a 75 y.o. female with past medical history significant for Bladder and ureteral ca, HLD, HTN who presents today for routine follow up.  HTN Amlodipine 10 mg Maxzide Takes daily in AM BP Readings from Last 3 Encounters:  04/20/20 (!) 144/84  10/04/19 125/65  09/23/19 132/72   HLD Atorvastatin 43m daily Lab Results  Component Value Date   CHOL 182 03/31/2019   HDL 53 03/31/2019   LDLCALC 105 (H) 03/31/2019   TRIG 134 03/31/2019   CHOLHDL 3.4 03/31/2019   The 10-year ASCVD risk score (Mikey BussingDC Jr., et al., 2013) is: 19%   Values used to calculate the score:     Age: [age]years     Sex: Female     Is Non-Hispanic African American: No     Diabetic: No     Tobacco smoker: No     Systolic Blood Pressure: 1802mmHg     Is BP treated: Yes     HDL Cholesterol: 53 mg/dL     Total Cholesterol: 182 mg/dL   Recent Dermatology appoint, urology, Dentist, and eye doctor all in past month  Health Maintenance  Topic Date Due  . TETANUS/TDAP  Never done  . COVID-19 Vaccine (4 - Booster for Pfizer series) 07/09/2020  . MAMMOGRAM  11/12/2021  . COLONOSCOPY (Pts [id number] coverage will need to be confirmed)  07/22/2029  . INFLUENZA VACCINE  Completed  . DEXA SCAN  Completed  . Hepatitis C Screening  Completed  . PNA vac Low Risk Adult  Completed     [facility] 04/12/2020 03/31/2019 10/22/2018  Decreased Interest 0 0 0  Down, Depressed, Hopeless 0 0 0  PHQ - 2 Score 0 0 0    Fall Risk  04/12/2020 03/31/2019 10/22/2018 05/17/2018 04/13/2018  Falls in the past year? 0 0 1 0 0  Number falls in past yr: 0 0 0 0 -  Injury with Fall? 0 0 1 0 -  Comment - - wears boot on left foot - -  Follow up Falls evaluation completed;Education provided - - Falls  evaluation completed -     Allergies  Allergen Reactions  . Hycodan [Hydrocodone-Homatropine] Hives    Prior to Admission medications   Medication Sig Start Date End Date Taking? Authorizing Provider  amLODipine (NORVASC) 10 MG tablet Take 1 tablet (10 mg total) by mouth daily. 09/22/19  Yes SaJacelyn PiIrLilia ArgueMD  atorvastatin (LIPITOR) 20 MG tablet Take 1 tablet (20 mg total) by mouth daily. 09/22/19  Yes SaJacelyn PiIrLilia ArgueMD  triamterene-hydrochlorothiazide (MAXZIDE-25) 37.5-25 MG tablet Take 1 tablet by mouth daily. 03/30/20  Yes MoMaximiano CossNP    Past Medical History:  Diagnosis Date  . [place] Dup[place]   . Duplex kidney    RIGHT  . History of adenomatous polyp of colon   . History of recurrent UTIs   . Hyperlipidemia   . Hypertension   . Wears contact lenses     Past Surgical History:  Procedure Laterality Date  . BENIGN BREAST BX Right 1995  . BUNIONECTOMY Right 2000  . bunionectomy left foot  04/2016  . COLONOSCOPY W/ POLYPECTOMY  01/06/2013  single sessile polyp ascending colon.  [facility].  Marland Kitchen CYSTOSCOPY W/ RETROGRADES Bilateral 12/10/2013   Procedure: RETROGRADE PYELOGRAM  ;  Surgeon: Alexis Frock, MD;  Location: [facility];  Service: Urology;  Laterality: Bilateral;  . CYSTOSCOPY W/ RETROGRADES Bilateral 07/15/2014   Procedure: CYSTOSCOPY WITH RETROGRADE PYELOGRAM AND INSTILLATION OF MITOMYCIN C;  Surgeon: Alexis Frock, MD;  Location: [facility];  Service: Urology;  Laterality: Bilateral;  . CYSTOSCOPY W/ RETROGRADES Bilateral 12/31/2015   Procedure: CYSTOSCOPY WITH RETROGRADE PYELOGRAM;  Surgeon: Alexis Frock, MD;  Location: [facility];  Service: Urology;  Laterality: Bilateral;  . CYSTOSCOPY W/ RETROGRADES Bilateral 11/17/2016   Procedure: CYSTOSCOPY WITH RETROGRADE PYELOGRAM;  Surgeon: Alexis Frock, MD;  Location: [facility];   Service: Urology;  Laterality: Bilateral;  . CYSTOSCOPY W/ RETROGRADES Bilateral 06/13/2019   Procedure: CYSTOSCOPY WITH RETROGRADE PYELOGRAM;  Surgeon: Alexis Frock, MD;  Location: [facility];  Service: Urology;  Laterality: Bilateral;  . CYSTOSCOPY W/ URETERAL STENT PLACEMENT Bilateral 10/22/2013   Procedure: CYSTOSCOPY WITH RETROGRADE PYELOGRAM;  Surgeon: Alexis Frock, MD;  Location: [facility];  Service: Urology;  Laterality: Bilateral;  . CYSTOSCOPY W/ URETERAL STENT PLACEMENT Left 10/03/2019   Procedure: CYSTOSCOPY WITH STENT REPLACEMENT;  Surgeon: Alexis Frock, MD;  Location: [facility];  Service: Urology;  Laterality: Left;  . CYSTOSCOPY WITH RETROGRADE PYELOGRAM, URETEROSCOPY AND STENT PLACEMENT Bilateral 04/14/2015   Procedure: CYSTOSCOPY WITH RETROGRADE PYELOGRAM, URETEROSCOPY AND RIGHT STENT PLACEMENT;  Surgeon: Alexis Frock, MD;  Location: [facility];  Service: Urology;  Laterality: Bilateral;  . TONSILLECTOMY  39   (age 75)  . TRANSURETHRAL RESECTION OF BLADDER TUMOR N/A 12/31/2015   Procedure: TRANSURETHRAL RESECTION OF BLADDER TUMOR (TURBT) WITH GYRUS;  Surgeon: Alexis Frock, MD;  Location: [facility];  Service: Urology;  Laterality: N/A;  . TRANSURETHRAL RESECTION OF BLADDER TUMOR N/A 11/17/2016   Procedure: TRANSURETHRAL RESECTION OF BLADDER TUMOR (TURBT);  Surgeon: Alexis Frock, MD;  Location[facility];  Service: Urology;  Laterality: N/A;  . TRANSURETHRAL RESECTION OF BLADDER TUMOR N/A 06/13/2019   Procedure: TRANSURETHRAL RESECTION OF BLADDER TUMOR (TURBT). LEFT DIAGNOSTIC URETEROSCOPY AND LEFT STENT PLACEMENT;  Surgeon: Alexis Frock, MD;  Location: [facility];  Service: Urology;  Laterality: N/A;  22 MINS  . TRANSURETHRAL RESECTION OF BLADDER TUMOR WITH GYRUS (TURBT-GYRUS) N/A 10/22/2013   Procedure: TRANSURETHRAL RESECTION OF BLADDER TUMOR WITH ERBE.;  Surgeon: Alexis Frock,  MD;  Location: [facility];  Service: Urology;  Laterality: N/A;  . TRANSURETHRAL RESECTION OF BLADDER TUMOR WITH GYRUS (TURBT-GYRUS) N/A 12/10/2013   Procedure: RESTAGING TRANSURETHRAL RESECTION OF BLADDER TUMOR WITH GYRUS (TURBT-GYRUS);  Surgeon: Alexis Frock, MD;  Location: [facility];  Service: Urology;  Laterality: N/A;  . TRANSURETHRAL RESECTION OF BLADDER TUMOR WITH GYRUS (TURBT-GYRUS) N/A 07/15/2014   Procedure: TRANSURETHRAL RESECTION OF BLADDER TUMOR WITH GYRUS (TURBT-GYRUS);  Surgeon: Alexis Frock, MD;  Location: [facility];  Service: Urology;  Laterality: N/A;  . TRANSURETHRAL RESECTION OF BLADDER TUMOR WITH GYRUS (TURBT-GYRUS) N/A 04/14/2015   Procedure: TRANSURETHRAL RESECTION OF BLADDER TUMOR WITH GYRUS (TURBT-GYRUS);  Surgeon: Alexis Frock, MD;  Location: [facility];  Service: Urology;  Laterality: N/A;    Social History   Tobacco Use  . Smoking status: Former Smoker    Packs/day: 0.15    Years: 20.00    Pack years: 3.00    Types: Cigarettes  Quit date: 10/17/2012    Years since quitting: 7.5  . Smokeless tobacco: Never Used  Substance Use Topics  . Alcohol use: Yes    Comment: OCCASIONAL    Family History  Problem Relation Age of Onset  . Hypertension Mother   . Arthritis Mother        knee replacement; shoulder replacement  . Heart disease Mother        AAA; mild AMI in 2018  . Stroke Mother 53       TIA  . Cancer Father        prostate cancer  . Cancer Brother        prostate cancer  . Cancer Brother 50       Melanoma  . Mental illness Sister 39       bipolar disorder    Review of Systems  Constitutional: Negative for chills, fever and malaise/fatigue.  Eyes: Negative for blurred vision and double vision.  Respiratory: Negative for cough, shortness of breath and wheezing.   Cardiovascular: Negative for chest pain, palpitations and leg swelling.  Gastrointestinal: Negative for abdominal  pain, blood in stool, constipation, diarrhea, heartburn, nausea and vomiting.  Genitourinary: Negative for dysuria, frequency and hematuria.  Musculoskeletal: Negative for back pain and joint pain.  Skin: Negative for rash.  Neurological: Negative for dizziness, weakness and headaches.     OBJECTIVE:  Today's Vitals   04/20/20 1010  BP: (!) 144/84  Pulse: 78  Temp: 98.6 F (37 C)  SpO2: 96%  Weight: 216 lb (98 kg)  Height: _0  (1.803 m)   Body mass index is 30.13 kg/m.   Physical Exam Constitutional:      General: She is not in acute distress.    Appearance: Normal appearance. She is not ill-appearing.  HENT:     Head: Normocephalic.  Cardiovascular:     Rate and Rhythm: Normal rate and regular rhythm.     Pulses: Normal pulses.     Heart sounds: Normal heart sounds. No murmur heard. No friction rub. No gallop.   Pulmonary:     Effort: Pulmonary effort is normal. No respiratory distress.     Breath sounds: Normal breath sounds. No stridor. No wheezing, rhonchi or rales.  Abdominal:     General: Bowel sounds are normal.     Palpations: Abdomen is soft.     Tenderness: There is no abdominal tenderness.  Musculoskeletal:     Right lower leg: No edema.     Left lower leg: No edema.  Skin:    General: Skin is warm and dry.  Neurological:     Mental Status: She is alert and oriented to person, place, and time.  Psychiatric:        Mood and Affect: Mood normal.        Behavior: Behavior normal.     No results found for this or any previous visit (from the past 24 hour(s)).  No results found.   ASSESSMENT and PLAN  Problem List Items Addressed This Visit      Cardiovascular and Mediastinum   Essential hypertension, benign - Primary   Relevant Medications   triamterene-hydrochlorothiazide (MAXZIDE-25) 37.5-25 MG tablet   Other Relevant Orders   CMP14+EGFR   Hemoglobin A1c     Other   Pure hypercholesterolemia   Relevant Medications    triamterene-hydrochlorothiazide (MAXZIDE-25) 37.5-25 MG tablet   Other Relevant Orders   Lipid Panel    Other Visit Diagnoses    Anemia, unspecified type  Relevant Orders   CBC with Differential   Iron, TIBC and Ferritin Panel   Encounter for vaccination       Relevant Orders   Flu Vaccine QUAD High Dose(Fluad) (Completed)    No changes to medications Flu shot given this visit Will follow up with lab results Will continue to monitor BP for goal < 130/80   Return for next scheduled appointment.    Huston Foley Alexis Lausch, FNP-BC [facility] [place], [place] [address] Ph.  [phone] Fax [phone]

## 2020-04-20 NOTE — Progress Notes (Signed)
   Follow-Up Visit   Subjective  [name] is a 75 y.o. female who presents for the following: Annual Exam (Patient here today for yearly skin check. No concerns.).  General skin examination Location:  Duration:  Quality:  Associated Signs/Symptoms: Modifying Factors:  Severity:  Timing: Context:   Objective  Well appearing patient in no apparent distress; mood and affect are within normal limits. Objective  Head - Anterior (Face): Head to toe exam today: No atypical moles or melanoma. 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 Multiple keratoses  Ak temple   Objective  Left Temple: 4 mm pink hornlike rest   A full examination was performed including scalp, head, eyes, ears, nose, lips, neck, chest, axillae, abdomen, back, buttocks, bilateral upper extremities, bilateral lower extremities, hands, feet, fingers, toes, fingernails, and toenails. All findings within normal limits unless otherwise noted below.   Assessment & Plan    Skin exam for malignant neoplasm Head - Anterior (Face)  Yearly skin check.  Seborrheic keratosis Right Abdomen (side) - Upper  AK (actinic keratosis) Left Temple  Destruction of lesion - Left Temple Complexity: simple   Destruction method: cryotherapy   Informed consent: discussed and consent obtained   Lesion destroyed using liquid nitrogen: Yes   Cryotherapy cycles:  3 Outcome: patient tolerated procedure well with no complications       I, Lavonna Monarch, MD, have reviewed all documentation for this visit.  The documentation on 04/20/20 for the exam, diagnosis, procedures, and orders are all accurate and complete.

## 2020-04-21 ENCOUNTER — Other Ambulatory Visit: Payer: Self-pay | Admitting: Family Medicine

## 2020-04-21 ENCOUNTER — Encounter: Payer: Self-pay | Admitting: Dermatology

## 2020-04-21 DIAGNOSIS — E7800 Pure hypercholesterolemia, unspecified: Secondary | ICD-10-CM

## 2020-04-21 LAB — IRON,TIBC AND FERRITIN PANEL
Ferritin: 43 ng/mL (ref 15–150)
Iron Saturation: 31 % (ref 15–55)
Iron: 112 ug/dL (ref 27–139)
Total Iron Binding Capacity: 359 ug/dL (ref 250–450)
UIBC: 247 ug/dL (ref 118–369)

## 2020-04-21 LAB — CBC WITH DIFFERENTIAL/PLATELET
Basophils Absolute: 0.1 10*3/uL (ref 0.0–0.2)
Basos: 1 %
EOS (ABSOLUTE): 0.2 10*3/uL (ref 0.0–0.4)
Eos: 3 %
Hematocrit: 39.5 % (ref 34.0–46.6)
Hemoglobin: 13 g/dL (ref 11.1–15.9)
Immature Grans (Abs): 0 10*3/uL (ref 0.0–0.1)
Immature Granulocytes: 0 %
Lymphocytes Absolute: 2 10*3/uL (ref 0.7–3.1)
Lymphs: 31 %
MCH: 30.2 pg (ref 26.6–33.0)
MCHC: 32.9 g/dL (ref 31.5–35.7)
MCV: 92 fL (ref 79–97)
Monocytes Absolute: 0.6 10*3/uL (ref 0.1–0.9)
Monocytes: 9 %
Neutrophils Absolute: 3.6 10*3/uL (ref 1.4–7.0)
Neutrophils: 56 %
Platelets: 360 10*3/uL (ref 150–450)
RBC: 4.31 x10E6/uL (ref 3.77–5.28)
RDW: 12.1 % (ref 11.7–15.4)
WBC: 6.4 10*3/uL (ref 3.4–10.8)

## 2020-04-21 LAB — CMP14+EGFR
ALT: 29 IU/L (ref 0–32)
AST: 27 IU/L (ref 0–40)
Albumin/Globulin Ratio: 2 (ref 1.2–2.2)
Albumin: 4.7 g/dL (ref 3.7–4.7)
Alkaline Phosphatase: 88 IU/L (ref 44–121)
BUN/Creatinine Ratio: 21 (ref 12–28)
BUN: 20 mg/dL (ref 8–27)
Bilirubin Total: 0.4 mg/dL (ref 0.0–1.2)
CO2: 25 mmol/L (ref 20–29)
Calcium: 9.9 mg/dL (ref 8.7–10.3)
Chloride: 102 mmol/L (ref 96–106)
Creatinine, Ser: 0.96 mg/dL (ref 0.57–1.00)
GFR calc Af Amer: 68 mL/min/{1.73_m2} (ref >59)
GFR calc non Af Amer: 59 mL/min/{1.73_m2} — ABNORMAL LOW (ref >59)
Globulin, Total: 2.3 g/dL (ref 1.5–4.5)
Glucose: 102 mg/dL — ABNORMAL HIGH (ref 65–99)
Potassium: 4.3 mmol/L (ref 3.5–5.2)
Sodium: 140 mmol/L (ref 134–144)
Total Protein: 7 g/dL (ref 6.0–8.5)

## 2020-04-21 LAB — LIPID PANEL
Chol/HDL Ratio: 3.5 ratio (ref 0.0–4.4)
Cholesterol, Total: 179 mg/dL (ref 100–199)
HDL: 51 mg/dL (ref >39)
LDL Chol Calc (NIH): 90 mg/dL (ref 0–99)
Triglycerides: 229 mg/dL — ABNORMAL HIGH (ref 0–149)
VLDL Cholesterol Cal: 38 mg/dL (ref 5–40)

## 2020-04-21 LAB — HEMOGLOBIN A1C
Est. average glucose Bld gHb Est-mCnc: 123 mg/dL
Hgb A1c MFr Bld: 5.9 % — ABNORMAL HIGH (ref 4.8–5.6)

## 2020-04-21 MED ORDER — ATORVASTATIN CALCIUM 40 MG PO TABS: 40.0000 mg | ORAL_TABLET | Freq: Every day | ORAL | 1 refills | Status: DC

## 2020-04-26 ENCOUNTER — Other Ambulatory Visit: Payer: Self-pay | Admitting: Registered Nurse

## 2020-04-26 DIAGNOSIS — I10 Essential (primary) hypertension: Secondary | ICD-10-CM

## 2020-05-03 ENCOUNTER — Ambulatory Visit (HOSPITAL_COMMUNITY)
Admission: RE | Admit: 2020-05-03 | Discharge: 2020-05-03 | Disposition: A | Discharge status: Home / Self Care | Payer: Medicare Other | Source: Ambulatory Visit | Attending: Urology | Admitting: Urology

## 2020-05-03 ENCOUNTER — Other Ambulatory Visit: Payer: Self-pay

## 2020-05-03 ENCOUNTER — Other Ambulatory Visit (HOSPITAL_COMMUNITY): Payer: Self-pay | Admitting: Urology

## 2020-05-03 DIAGNOSIS — C662 Malignant neoplasm of left ureter: Secondary | ICD-10-CM | POA: Insufficient documentation

## 2020-07-05 ENCOUNTER — Encounter: Payer: Medicare Other | Admitting: Family Medicine

## 2020-09-08 ENCOUNTER — Other Ambulatory Visit: Payer: Self-pay

## 2020-09-09 ENCOUNTER — Encounter: Payer: Self-pay | Admitting: Internal Medicine

## 2020-09-09 ENCOUNTER — Ambulatory Visit (INDEPENDENT_AMBULATORY_CARE_PROVIDER_SITE_OTHER): Payer: Medicare Other | Admitting: Internal Medicine

## 2020-09-09 ENCOUNTER — Other Ambulatory Visit: Payer: Self-pay

## 2020-09-09 VITALS — BP 134/86 | HR 87 | Temp 98.6°F | Resp 16 | Ht 71.0 in | Wt 216.0 lb

## 2020-09-09 DIAGNOSIS — N1831 Chronic kidney disease, stage 3a: Secondary | ICD-10-CM | POA: Diagnosis not present

## 2020-09-09 DIAGNOSIS — Z0000 Encounter for general adult medical examination without abnormal findings: Secondary | ICD-10-CM | POA: Insufficient documentation

## 2020-09-09 DIAGNOSIS — R739 Hyperglycemia, unspecified: Secondary | ICD-10-CM

## 2020-09-09 DIAGNOSIS — E7800 Pure hypercholesterolemia, unspecified: Secondary | ICD-10-CM

## 2020-09-09 DIAGNOSIS — Z23 Encounter for immunization: Secondary | ICD-10-CM | POA: Diagnosis not present

## 2020-09-09 DIAGNOSIS — I10 Essential (primary) hypertension: Secondary | ICD-10-CM | POA: Diagnosis not present

## 2020-09-09 LAB — HEMOGLOBIN A1C: Hgb A1c MFr Bld: 6 % (ref 4.6–6.5)

## 2020-09-09 LAB — TSH: TSH: 1.58 u[IU]/mL (ref 0.35–4.50)

## 2020-09-09 NOTE — Progress Notes (Signed)
Subjective:  Patient ID: [name], female    DOB: [birth year]  Age: 75 y.o. MRN: [id number]  CC: Annual Exam, Hypertension, and Hyperlipidemia  This visit occurred during the SARS-CoV-2 public health emergency.  Safety protocols were in place, including screening questions prior to the visit, additional usage of staff PPE, and extensive cleaning of exam room while observing appropriate contact time as indicated for disinfecting solutions.    HPI [name] presents for a CPX, f/up, and to establish.  She tells me that her BP has been well controlled. She is very active (hiking and swimming) and does not experience CP, DOE, diaphoresis, edema, or fatigue.  History [name] has a past medical history of Bladder cancer ([place]) (RECURRENT ), Duplex kidney, Duplex kidney, History of adenomatous polyp of colon, History of recurrent UTIs, Hyperlipidemia, Hypertension, and Wears contact lenses.   She has a past surgical history that includes Bunionectomy (Right, 2000); Tonsillectomy (41   (age 69)); BENIGN BREAST BX (Right, 1995); Colonoscopy w/ polypectomy (01/06/2013); Transurethral resection of bladder tumor with gyrus (turbt-gyrus) (N/A, 10/22/2013); Cystoscopy w/ ureteral stent placement (Bilateral, 10/22/2013); Transurethral resection of bladder tumor with gyrus (turbt-gyrus) (N/A, 12/10/2013); Cystoscopy w/ retrogrades (Bilateral, 12/10/2013); Transurethral resection of bladder tumor with gyrus (turbt-gyrus) (N/A, 07/15/2014); Cystoscopy w/ retrogrades (Bilateral, 07/15/2014); Transurethral resection of bladder tumor with gyrus (turbt-gyrus) (N/A, 04/14/2015); Cystoscopy with retrograde pyelogram, ureteroscopy and stent placement (Bilateral, 04/14/2015); Transurethral resection of bladder tumor (N/A, 12/31/2015); Cystoscopy w/ retrogrades (Bilateral, 12/31/2015); Transurethral resection of bladder tumor (N/A, 11/17/2016); Cystoscopy w/ retrogrades (Bilateral, 11/17/2016); bunionectomy left foot  (04/2016); Transurethral resection of bladder tumor (N/A, 06/13/2019); Cystoscopy w/ retrogrades (Bilateral, 06/13/2019); and Cystoscopy w/ ureteral stent placement (Left, 10/03/2019).   Her family history includes Arthritis in her mother; Cancer in her brother and father; Cancer (age of onset: 44) in her brother; Heart disease in her mother; Hypertension in her mother; Mental illness (age of onset: 47) in her sister; Stroke (age of onset: 33) in her mother.She reports that she quit smoking about 7 years ago. Her smoking use included cigarettes. She has a 3.00 pack-year smoking history. She has never used smokeless tobacco. She reports current alcohol use. She reports that she does not use drugs.  Outpatient Medications Prior to Visit  Medication Sig Dispense Refill  . amLODipine (NORVASC) 10 MG tablet Take 1 tablet (10 mg total) by mouth daily. 90 tablet 3  . atorvastatin (LIPITOR) 40 MG tablet Take 1 tablet (40 mg total) by mouth daily. 90 tablet 1  . triamterene-hydrochlorothiazide (MAXZIDE-25) 37.5-25 MG tablet TAKE 1 TABLET BY MOUTH EVERY DAY 30 tablet 0   No facility-administered medications prior to visit.    ROS Review of Systems  Constitutional: Negative for diaphoresis, fatigue and unexpected weight change.  HENT: Negative.   Eyes: Negative for visual disturbance.  Respiratory: Negative for cough, chest tightness, shortness of breath and wheezing.   Cardiovascular: Negative for chest pain, palpitations and leg swelling.  Gastrointestinal: Negative for abdominal pain, diarrhea, nausea and vomiting.  Endocrine: Negative.   Genitourinary: Negative.  Negative for difficulty urinating.  Musculoskeletal: Negative.  Negative for arthralgias and myalgias.  Skin: Negative.   Neurological: Negative.  Negative for dizziness, weakness, light-headedness and headaches.  Hematological: Negative for adenopathy. Does not bruise/bleed easily.  Psychiatric/Behavioral: Negative.     Objective:  BP  134/86 (BP Location: Right Arm, Patient Position: Sitting, Cuff Size: Large)   Pulse 87   Temp 98.6 F (37 C) (Oral)   Resp 16  Ht 5\' 11"  (1.803 m)   Wt 216 lb (98 kg)   SpO2 95%   BMI 30.13 kg/m   Physical Exam Vitals reviewed.  Constitutional:      Appearance: Normal appearance.  HENT:     Nose: Nose normal.     Mouth/Throat:     Mouth: Mucous membranes are moist.  Eyes:     General: No scleral icterus.    Conjunctiva/sclera: Conjunctivae normal.  Cardiovascular:     Rate and Rhythm: Normal rate and regular rhythm.     Heart sounds: No murmur heard.   Pulmonary:     Effort: Pulmonary effort is normal.     Breath sounds: No stridor. No wheezing, rhonchi or rales.  Abdominal:     General: Abdomen is flat.     Palpations: There is no mass.     Tenderness: There is no abdominal tenderness. There is no guarding or rebound.     Hernia: No hernia is present.  Musculoskeletal:        General: Normal range of motion.     Cervical back: Neck supple.     Right lower leg: No edema.     Left lower leg: No edema.  Lymphadenopathy:     Cervical: No cervical adenopathy.  Skin:    General: Skin is warm and dry.     Coloration: Skin is not pale.  Neurological:     General: No focal deficit present.     Mental Status: She is alert.  Psychiatric:        Mood and Affect: Mood normal.        Behavior: Behavior normal.     Lab Results  Component Value Date   WBC 6.4 04/20/2020   HGB 13.0 04/20/2020   HCT 39.5 04/20/2020   PLT 360 04/20/2020   GLUCOSE 95 09/09/2020   CHOL 179 04/20/2020   TRIG 229 (H) 04/20/2020   HDL 51 04/20/2020   LDLCALC 90 04/20/2020   ALT 29 04/20/2020   AST 27 04/20/2020   NA 142 09/09/2020   K 4.2 09/09/2020   CL 104 09/09/2020   CREATININE 1.10 09/09/2020   BUN 23 09/09/2020   CO2 24 09/09/2020   TSH 1.58 09/09/2020   INR 1.4 05/05/2008   HGBA1C 6.0 09/09/2020    Assessment & Plan:   [name] was seen today for annual exam,  hypertension and hyperlipidemia.  Diagnoses and all orders for this visit:  Essential hypertension, benign- Her BP is well controlled. -     Basic metabolic panel; Future -     Basic metabolic panel  Pure hypercholesterolemia- LDL goal achieved. Doing well on the statin -     TSH; Future -     TSH  Routine general medical examination at a health care facility- Exam completed, labs reviewed, vaccines updated, cancer screenings are UTD, pt ed material was given.  Hyperglycemia- She is prediabetic. -     Basic metabolic panel; Future -     Hemoglobin A1c; Future -     Hemoglobin A1c -     Basic metabolic panel  Need for vaccination -     Pneumococcal conjugate vaccine 20-valent  Other orders -     Tdap vaccine greater than or equal to 7yo IM   I am having [name] "[name]" maintain her amLODipine, atorvastatin, and triamterene-hydrochlorothiazide.  No orders of the defined types were placed in this encounter.    Follow-up: Return in about 6 months (around 03/11/2021).  Scarlette Calico, MD

## 2020-09-09 NOTE — Progress Notes (Signed)
Pt has been informed that the recommended vaccine TDAP, may not be covered under their current Medicare insurance. Discussed that they will possibly incur a bill for the TDAP vaccine & administration fee.  Pt understands that if they want the TDAP vaccine, Medicare will be billed for an official decision on payment, which will be sent to them in a Medicare Summary Notice (MSN). They understand that if Medicare does not pay, they are responsible for payment.     

## 2020-09-09 NOTE — Patient Instructions (Signed)
Health Maintenance, Female Adopting a healthy lifestyle and getting preventive care are important in promoting health and wellness. Ask your health care provider about:  The right schedule for you to have regular tests and exams.  Things you can do on your own to prevent diseases and keep yourself healthy. What should I know about diet, weight, and exercise? Eat a healthy diet  Eat a diet that includes plenty of vegetables, fruits, low-fat dairy products, and lean protein.  Do not eat a lot of foods that are high in solid fats, added sugars, or sodium.   Maintain a healthy weight Body mass index (BMI) is used to identify weight problems. It estimates body fat based on height and weight. Your health care provider can help determine your BMI and help you achieve or maintain a healthy weight. Get regular exercise Get regular exercise. This is one of the most important things you can do for your health. Most adults should:  Exercise for at least 150 minutes each week. The exercise should increase your heart rate and make you sweat (moderate-intensity exercise).  Do strengthening exercises at least twice a week. This is in addition to the moderate-intensity exercise.  Spend less time sitting. Even light physical activity can be beneficial. Watch cholesterol and blood lipids Have your blood tested for lipids and cholesterol at 73 years of age, then have this test every 5 years. Have your cholesterol levels checked more often if:  Your lipid or cholesterol levels are high.  You are older than 73 years of age.  You are at high risk for heart disease. What should I know about cancer screening? Depending on your health history and family history, you may need to have cancer screening at various ages. This may include screening for:  Breast cancer.  Cervical cancer.  Colorectal cancer.  Skin cancer.  Lung cancer. What should I know about heart disease, diabetes, and high blood  pressure? Blood pressure and heart disease  High blood pressure causes heart disease and increases the risk of stroke. This is more likely to develop in people who have high blood pressure readings, are of African descent, or are overweight.  Have your blood pressure checked: ? Every 3-5 years if you are 18-39 years of age. ? Every year if you are 40 years old or older. Diabetes Have regular diabetes screenings. This checks your fasting blood sugar level. Have the screening done:  Once every three years after age 40 if you are at a normal weight and have a low risk for diabetes.  More often and at a younger age if you are overweight or have a high risk for diabetes. What should I know about preventing infection? Hepatitis B If you have a higher risk for hepatitis B, you should be screened for this virus. Talk with your health care provider to find out if you are at risk for hepatitis B infection. Hepatitis C Testing is recommended for:  Everyone born from 1945 through 1965.  Anyone with known risk factors for hepatitis C. Sexually transmitted infections (STIs)  Get screened for STIs, including gonorrhea and chlamydia, if: ? You are sexually active and are younger than 73 years of age. ? You are older than 73 years of age and your health care provider tells you that you are at risk for this type of infection. ? Your sexual activity has changed since you were last screened, and you are at increased risk for chlamydia or gonorrhea. Ask your health care provider   if you are at risk.  Ask your health care provider about whether you are at high risk for HIV. Your health care provider may recommend a prescription medicine to help prevent HIV infection. If you choose to take medicine to prevent HIV, you should first get tested for HIV. You should then be tested every 3 months for as long as you are taking the medicine. Pregnancy  If you are about to stop having your period (premenopausal) and  you may become pregnant, seek counseling before you get pregnant.  Take 400 to 800 micrograms (mcg) of folic acid every day if you become pregnant.  Ask for birth control (contraception) if you want to prevent pregnancy. Osteoporosis and menopause Osteoporosis is a disease in which the bones lose minerals and strength with aging. This can result in bone fractures. If you are 65 years old or older, or if you are at risk for osteoporosis and fractures, ask your health care provider if you should:  Be screened for bone loss.  Take a calcium or vitamin D supplement to lower your risk of fractures.  Be given hormone replacement therapy (HRT) to treat symptoms of menopause. Follow these instructions at home: Lifestyle  Do not use any products that contain nicotine or tobacco, such as cigarettes, e-cigarettes, and chewing tobacco. If you need help quitting, ask your health care provider.  Do not use street drugs.  Do not share needles.  Ask your health care provider for help if you need support or information about quitting drugs. Alcohol use  Do not drink alcohol if: ? Your health care provider tells you not to drink. ? You are pregnant, may be pregnant, or are planning to become pregnant.  If you drink alcohol: ? Limit how much you use to 0-1 drink a day. ? Limit intake if you are breastfeeding.  Be aware of how much alcohol is in your drink. In the [place] one drink equals one 12 oz bottle of beer (355 mL), one 5 oz glass of wine (148 mL), or one 1 oz glass of hard liquor (44 mL). General instructions  Schedule regular health, dental, and eye exams.  Stay current with your vaccines.  Tell your health care provider if: ? You often feel depressed. ? You have ever been abused or do not feel safe at home. Summary  Adopting a healthy lifestyle and getting preventive care are important in promoting health and wellness.  Follow your health care provider's instructions about healthy  diet, exercising, and getting tested or screened for diseases.  Follow your health care provider's instructions on monitoring your cholesterol and blood pressure. This information is not intended to replace advice given to you by your health care provider. Make sure you discuss any questions you have with your health care provider. Document Revised: 03/20/2018 Document Reviewed: 03/20/2018 Elsevier Patient Education  2021 Elsevier Inc.  

## 2020-09-10 LAB — BASIC METABOLIC PANEL
BUN: 23 mg/dL (ref 6–23)
CO2: 24 mEq/L (ref 19–32)
Calcium: 9.4 mg/dL (ref 8.4–10.5)
Chloride: 104 mEq/L (ref 96–112)
Creatinine, Ser: 1.1 mg/dL (ref 0.40–1.20)
GFR: 50.05 mL/min — ABNORMAL LOW (ref >60.00)
Glucose, Bld: 95 mg/dL (ref 70–99)
Potassium: 4.2 mEq/L (ref 3.5–5.1)
Sodium: 142 mEq/L (ref 135–145)

## 2020-09-12 DIAGNOSIS — R739 Hyperglycemia, unspecified: Secondary | ICD-10-CM | POA: Insufficient documentation

## 2020-09-12 DIAGNOSIS — N1831 Chronic kidney disease, stage 3a: Secondary | ICD-10-CM | POA: Insufficient documentation

## 2020-09-12 DIAGNOSIS — Z23 Encounter for immunization: Secondary | ICD-10-CM | POA: Insufficient documentation

## 2020-09-12 NOTE — Assessment & Plan Note (Signed)
BP is well controlled She will avoid nephrotoxic agents

## 2020-10-10 ENCOUNTER — Encounter: Payer: Self-pay | Admitting: Internal Medicine

## 2020-10-12 ENCOUNTER — Other Ambulatory Visit: Payer: Self-pay | Admitting: Internal Medicine

## 2020-10-12 DIAGNOSIS — I10 Essential (primary) hypertension: Secondary | ICD-10-CM

## 2020-10-12 MED ORDER — AMLODIPINE BESYLATE 10 MG PO TABS: 10.0000 mg | ORAL_TABLET | Freq: Every day | ORAL | 1 refills | Status: DC

## 2020-10-13 ENCOUNTER — Ambulatory Visit: Payer: Medicare Other | Admitting: Internal Medicine

## 2020-11-15 DIAGNOSIS — Z1231 Encounter for screening mammogram for malignant neoplasm of breast: Secondary | ICD-10-CM | POA: Diagnosis not present

## 2020-11-15 LAB — HM MAMMOGRAPHY

## 2020-11-19 DIAGNOSIS — C678 Malignant neoplasm of overlapping sites of bladder: Secondary | ICD-10-CM | POA: Diagnosis not present

## 2020-11-19 DIAGNOSIS — N281 Cyst of kidney, acquired: Secondary | ICD-10-CM | POA: Diagnosis not present

## 2020-11-19 DIAGNOSIS — C662 Malignant neoplasm of left ureter: Secondary | ICD-10-CM | POA: Diagnosis not present

## 2020-12-24 ENCOUNTER — Other Ambulatory Visit: Payer: Self-pay | Admitting: Internal Medicine

## 2020-12-24 DIAGNOSIS — E7800 Pure hypercholesterolemia, unspecified: Secondary | ICD-10-CM

## 2020-12-24 NOTE — Telephone Encounter (Signed)
Ok for 1 mo, unless pt has not been seen per Dr Ronnald Ramp in the last 15 months

## 2021-01-06 ENCOUNTER — Other Ambulatory Visit: Payer: Self-pay | Admitting: Internal Medicine

## 2021-01-06 DIAGNOSIS — I10 Essential (primary) hypertension: Secondary | ICD-10-CM

## 2021-01-06 NOTE — Telephone Encounter (Signed)
Ok to pcp 

## 2021-01-14 ENCOUNTER — Other Ambulatory Visit: Payer: Self-pay | Admitting: Urology

## 2021-01-17 ENCOUNTER — Other Ambulatory Visit: Payer: Self-pay

## 2021-01-17 ENCOUNTER — Encounter (HOSPITAL_BASED_OUTPATIENT_CLINIC_OR_DEPARTMENT_OTHER): Payer: Self-pay | Admitting: Urology

## 2021-01-17 NOTE — Progress Notes (Addendum)
Spoke w/ via phone for pre-op interview---pt Lab needs dos----I stat , ekg               Lab results------none COVID test -----patient states asymptomatic no test needed Arrive at -------1230 pm 01-21-2021 NPO after MN NO Solid Food.  Clear liquids from MN until---1130 am Med rec completed Medications to take morning of surgery -----amlodipine, atorvastatin Diabetic medication -----n/aolish to be worn day of surgery Patient instructed to bring photo id and insurance card day of surgery Patient aware to have Driver (ride ) / caregiver    for 24 hours after  arriving with [name], [name] driver home and caregiver  after surgery  Patient Special Instructions -----none Pre-Op special Istructions -----orders need 2nd sign Patient verbalized understanding of instructions that were given at this phone interview. Patient denies shortness of breath, chest pain, fever, cough at this phone interview.

## 2021-01-21 ENCOUNTER — Ambulatory Visit (HOSPITAL_BASED_OUTPATIENT_CLINIC_OR_DEPARTMENT_OTHER): Payer: Medicare Other | Admitting: Anesthesiology

## 2021-01-21 ENCOUNTER — Encounter (HOSPITAL_BASED_OUTPATIENT_CLINIC_OR_DEPARTMENT_OTHER): Payer: Self-pay | Admitting: Urology

## 2021-01-21 ENCOUNTER — Encounter (HOSPITAL_BASED_OUTPATIENT_CLINIC_OR_DEPARTMENT_OTHER)
Admission: RE | Disposition: A | Discharge status: Home / Self Care | Payer: Self-pay | Source: Home / Self Care | Attending: Urology

## 2021-01-21 ENCOUNTER — Other Ambulatory Visit: Payer: Self-pay

## 2021-01-21 ENCOUNTER — Ambulatory Visit (HOSPITAL_BASED_OUTPATIENT_CLINIC_OR_DEPARTMENT_OTHER)
Admission: RE | Admit: 2021-01-21 | Discharge: 2021-01-21 | Disposition: A | Discharge status: Home / Self Care | Payer: Medicare Other | Attending: Urology | Admitting: Urology

## 2021-01-21 DIAGNOSIS — C670 Malignant neoplasm of trigone of bladder: Secondary | ICD-10-CM | POA: Insufficient documentation

## 2021-01-21 DIAGNOSIS — C679 Malignant neoplasm of bladder, unspecified: Secondary | ICD-10-CM | POA: Diagnosis not present

## 2021-01-21 DIAGNOSIS — I129 Hypertensive chronic kidney disease with stage 1 through stage 4 chronic kidney disease, or unspecified chronic kidney disease: Secondary | ICD-10-CM | POA: Diagnosis not present

## 2021-01-21 DIAGNOSIS — Z87891 Personal history of nicotine dependence: Secondary | ICD-10-CM | POA: Diagnosis not present

## 2021-01-21 DIAGNOSIS — E669 Obesity, unspecified: Secondary | ICD-10-CM | POA: Insufficient documentation

## 2021-01-21 DIAGNOSIS — Z6830 Body mass index (BMI) 30.0-30.9, adult: Secondary | ICD-10-CM | POA: Diagnosis not present

## 2021-01-21 DIAGNOSIS — Q625 Duplication of ureter: Secondary | ICD-10-CM | POA: Diagnosis not present

## 2021-01-21 DIAGNOSIS — N1831 Chronic kidney disease, stage 3a: Secondary | ICD-10-CM | POA: Diagnosis not present

## 2021-01-21 HISTORY — PX: CYSTOSCOPY WITH FULGERATION: SHX6638

## 2021-01-21 HISTORY — PX: CYSTOSCOPY WITH BIOPSY: SHX5122

## 2021-01-21 HISTORY — PX: CYSTOSCOPY W/ RETROGRADES: SHX1426

## 2021-01-21 LAB — POCT I-STAT, CHEM 8
BUN: 15 mg/dL (ref 8–23)
Calcium, Ion: 1.3 mmol/L (ref 1.15–1.40)
Chloride: 102 mmol/L (ref 98–111)
Creatinine, Ser: 0.8 mg/dL (ref 0.44–1.00)
Glucose, Bld: 100 mg/dL — ABNORMAL HIGH (ref 70–99)
HCT: 39 % (ref 36.0–46.0)
Hemoglobin: 13.3 g/dL (ref 12.0–15.0)
Potassium: 3.9 mmol/L (ref 3.5–5.1)
Sodium: 139 mmol/L (ref 135–145)
TCO2: 24 mmol/L (ref 22–32)

## 2021-01-21 SURGERY — CYSTOSCOPY, WITH BIOPSY
Anesthesia: General | Site: Pelvis

## 2021-01-21 MED ORDER — FENTANYL CITRATE (PF) 100 MCG/2ML IJ SOLN: 25.0000 ug | INTRAMUSCULAR | Status: DC | PRN

## 2021-01-21 MED ORDER — LACTATED RINGERS IV SOLN: INTRAVENOUS | Status: DC

## 2021-01-21 MED ORDER — PROPOFOL 10 MG/ML IV BOLUS
INTRAVENOUS | Status: DC | PRN
  Administered 2021-01-21: 160 mg via INTRAVENOUS

## 2021-01-21 MED ORDER — IOHEXOL 300 MG/ML  SOLN
INTRAMUSCULAR | Status: DC | PRN
  Administered 2021-01-21: 40 mL via URETHRAL

## 2021-01-21 MED ORDER — ACETAMINOPHEN 500 MG PO TABS: 1000.0000 mg | ORAL_TABLET | Freq: Once | ORAL | Status: DC

## 2021-01-21 MED ORDER — ONDANSETRON HCL 4 MG/2ML IJ SOLN
INTRAMUSCULAR | Status: DC | PRN
  Administered 2021-01-21: 4 mg via INTRAVENOUS

## 2021-01-21 MED ORDER — BUPIVACAINE HCL (PF) 0.5 % IJ SOLN
INTRAMUSCULAR | Status: AC
  Filled 2021-01-21: qty 30

## 2021-01-21 MED ORDER — OXYCODONE HCL 5 MG PO TABS: 5.0000 mg | ORAL_TABLET | Freq: Once | ORAL | Status: DC | PRN

## 2021-01-21 MED ORDER — FENTANYL CITRATE (PF) 100 MCG/2ML IJ SOLN
INTRAMUSCULAR | Status: AC
  Filled 2021-01-21: qty 2

## 2021-01-21 MED ORDER — PROPOFOL 10 MG/ML IV BOLUS
INTRAVENOUS | Status: AC
  Filled 2021-01-21: qty 20

## 2021-01-21 MED ORDER — STERILE WATER FOR IRRIGATION IR SOLN
Status: DC | PRN
  Administered 2021-01-21: 1000 mL

## 2021-01-21 MED ORDER — TRAMADOL HCL 50 MG PO TABS: 50.0000 mg | ORAL_TABLET | Freq: Four times a day (QID) | ORAL | 0 refills | Status: DC | PRN

## 2021-01-21 MED ORDER — EPHEDRINE 5 MG/ML INJ
INTRAVENOUS | Status: AC
  Filled 2021-01-21: qty 5

## 2021-01-21 MED ORDER — FENTANYL CITRATE (PF) 100 MCG/2ML IJ SOLN
INTRAMUSCULAR | Status: DC | PRN
  Administered 2021-01-21 (×4): 25 ug via INTRAVENOUS

## 2021-01-21 MED ORDER — DEXAMETHASONE SODIUM PHOSPHATE 4 MG/ML IJ SOLN
INTRAMUSCULAR | Status: DC | PRN
  Administered 2021-01-21: 10 mg via INTRAVENOUS

## 2021-01-21 MED ORDER — ONDANSETRON HCL 4 MG/2ML IJ SOLN
INTRAMUSCULAR | Status: AC
  Filled 2021-01-21: qty 2

## 2021-01-21 MED ORDER — OXYCODONE HCL 5 MG/5ML PO SOLN
5.0000 mg | Freq: Once | ORAL | Status: DC | PRN
Start: 2021-01-21 — End: 2021-01-21

## 2021-01-21 MED ORDER — LIDOCAINE 2% (20 MG/ML) 5 ML SYRINGE
INTRAMUSCULAR | Status: DC | PRN
  Administered 2021-01-21: 60 mg via INTRAVENOUS

## 2021-01-21 MED ORDER — LIDOCAINE 2% (20 MG/ML) 5 ML SYRINGE
INTRAMUSCULAR | Status: AC
  Filled 2021-01-21: qty 5

## 2021-01-21 MED ORDER — GENTAMICIN SULFATE 40 MG/ML IJ SOLN
5.0000 mg/kg | INTRAVENOUS | Status: AC
  Administered 2021-01-21: 410.4 mg via INTRAVENOUS
  Filled 2021-01-21: qty 10.25

## 2021-01-21 MED ORDER — DEXAMETHASONE SODIUM PHOSPHATE 10 MG/ML IJ SOLN
INTRAMUSCULAR | Status: AC
  Filled 2021-01-21: qty 1

## 2021-01-21 SURGICAL SUPPLY — 25 items
BAG DRAIN URO-CYSTO SKYTR STRL (DRAIN) ×3 IMPLANT
BAG DRN UROCATH (DRAIN) ×2
CATH INTERMIT  6FR 70CM (CATHETERS) ×3 IMPLANT
CATH ROBINSON RED A/P 14FR (CATHETERS) IMPLANT
CLOTH BEACON ORANGE TIMEOUT ST (SAFETY) ×3 IMPLANT
ELECT REM PT RETURN 9FT ADLT (ELECTROSURGICAL) ×3
ELECTRODE REM PT RTRN 9FT ADLT (ELECTROSURGICAL) ×2 IMPLANT
GLOVE SURG ENC MOIS LTX SZ7.5 (GLOVE) ×3 IMPLANT
GOWN STRL REUS W/TWL LRG LVL3 (GOWN DISPOSABLE) ×9 IMPLANT
GUIDEWIRE ANG ZIPWIRE 038X150 (WIRE) IMPLANT
GUIDEWIRE STR DUAL SENSOR (WIRE) IMPLANT
IV NS 1000ML (IV SOLUTION)
IV NS 1000ML BAXH (IV SOLUTION) IMPLANT
IV NS IRRIG 3000ML ARTHROMATIC (IV SOLUTION) IMPLANT
KIT TURNOVER CYSTO (KITS) ×3 IMPLANT
MANIFOLD NEPTUNE II (INSTRUMENTS) ×3 IMPLANT
NDL SAFETY ECLIPSE 18X1.5 (NEEDLE) ×2 IMPLANT
NEEDLE HYPO 18GX1.5 SHARP (NEEDLE) ×3
NS IRRIG 500ML POUR BTL (IV SOLUTION) IMPLANT
PACK CYSTO (CUSTOM PROCEDURE TRAY) ×3 IMPLANT
SYR 10ML LL (SYRINGE) IMPLANT
SYR 20ML LL LF (SYRINGE) IMPLANT
TUBE CONNECTING 12X1/4 (SUCTIONS) ×3 IMPLANT
WATER STERILE IRR 3000ML UROMA (IV SOLUTION) ×3 IMPLANT
WATER STERILE IRR 500ML POUR (IV SOLUTION) ×3 IMPLANT

## 2021-01-21 NOTE — Discharge Instructions (Addendum)
1 - You may have urinary urgency (bladder spasms) and bloody urine on / off for up to 2 weeks. This is normal.  2 - Call MD or go to ER for fever >102, severe pain / nausea / vomiting not relieved by medications, or acute change in medical status   CYSTOSCOPY HOME CARE INSTRUCTIONS  Activity: Rest for the remainder of the day.  Do not drive or operate equipment today.  You may resume normal activities in one to two days as instructed by your physician.   Meals: Drink plenty of liquids and eat light foods such as gelatin or soup this evening.  You may return to a normal meal plan tomorrow.  Return to Work: You may return to work in one to two days or as instructed by your physician.  Special Instructions / Symptoms: Call your physician if any of these symptoms occur:   -persistent or heavy bleeding  -bleeding which continues after first few urination  -large blood clots that are difficult to pass  -urine stream diminishes or stops completely  -fever equal to or higher than 101 degrees Farenheit.  -cloudy urine with a strong, foul odor  -severe pain  Females should always wipe from front to back after elimination.  You may feel some burning pain when you urinate.  This should disappear with time.  Applying moist heat to the lower abdomen or a hot tub bath may help relieve the pain. \  Follow-Up / Date of Return Visit to Your Physician:  as instructed Call for an appointment to arrange follow-up.    Post Anesthesia Home Care Instructions  Activity: Get plenty of rest for the remainder of the day. A responsible individual must stay with you for 24 hours following the procedure.  For the next 24 hours, DO NOT: -Drive a car -Paediatric nurse -Drink alcoholic beverages -Take any medication unless instructed by your physician -Make any legal decisions or sign important papers.  Meals: Start with liquid foods such as gelatin or soup. Progress to regular foods as tolerated. Avoid  greasy, spicy, heavy foods. If nausea and/or vomiting occur, drink only clear liquids until the nausea and/or vomiting subsides. Call your physician if vomiting continues.  Special Instructions/Symptoms: Your throat may feel dry or sore from the anesthesia or the breathing tube placed in your throat during surgery. If this causes discomfort, gargle with warm salt water. The discomfort should disappear within 24 hours.  If you had a scopolamine patch placed behind your ear for the management of post- operative nausea and/or vomiting:  1. The medication in the patch is effective for 72 hours, after which it should be removed.  Wrap patch in a tissue and discard in the trash. Wash hands thoroughly with soap and water. 2. You may remove the patch earlier than 72 hours if you experience unpleasant side effects which may include dry mouth, dizziness or visual disturbances. 3. Avoid touching the patch. Wash your hands with soap and water after contact with the patch.

## 2021-01-21 NOTE — Anesthesia Preprocedure Evaluation (Signed)
Anesthesia Evaluation  Patient identified by MRN, date of birth, ID band Patient awake    Reviewed: Allergy & Precautions, NPO status , Patient's Chart, lab work & pertinent test results  History of Anesthesia Complications Negative for: history of anesthetic complications  Airway Mallampati: II  TM Distance: >3 FB Neck ROM: Full    Dental  (+) Dental Advisory Given, Teeth Intact   Pulmonary former smoker,    breath sounds clear to auscultation       Cardiovascular hypertension, Pt. on medications  Rhythm:Regular     Neuro/Psych negative neurological ROS  negative psych ROS   GI/Hepatic negative GI ROS, Neg liver ROS,   Endo/Other   Obesity   Renal/GU Renal disease    Bladder tumor     Musculoskeletal negative musculoskeletal ROS (+)   Abdominal   Peds  Hematology negative hematology ROS (+)   Anesthesia Other Findings Covid neg 06/10/19   Reproductive/Obstetrics                             Anesthesia Physical Anesthesia Plan  ASA: 2  Anesthesia Plan: General   Post-op Pain Management:    Induction: Intravenous  PONV Risk Score and Plan: 3 and Ondansetron and Dexamethasone  Airway Management Planned: LMA  Additional Equipment: None  Intra-op Plan:   Post-operative Plan: Extubation in OR  Informed Consent: I have reviewed the patients History and Physical, chart, labs and discussed the procedure including the risks, benefits and alternatives for the proposed anesthesia with the patient or authorized representative who has indicated his/her understanding and acceptance.     Dental advisory given  Plan Discussed with: CRNA and Anesthesiologist  Anesthesia Plan Comments:         Anesthesia Quick Evaluation

## 2021-01-21 NOTE — Brief Op Note (Signed)
01/21/2021  2:28 PM  PATIENT:  [name]  75 y.o. female  PRE-OPERATIVE DIAGNOSIS:  BLADDER CANCER  POST-OPERATIVE DIAGNOSIS:  BLADDER CANCER  PROCEDURE:  Procedure(s): CYSTOSCOPY WITH BIOPSY (N/A) CYSTOSCOPY WITH FULGERATION (N/A) CYSTOSCOPY WITH RETROGRADE PYELOGRAM (Bilateral)  SURGEON:  Surgeon(s) and Role:    * Alexis Frock, MD - Primary  PHYSICIAN ASSISTANT:   ASSISTANTS: none   ANESTHESIA:   general  EBL:  minimal   BLOOD ADMINISTERED:none  DRAINS: none   LOCAL MEDICATIONS USED:  NONE  SPECIMEN:  Source of Specimen:  bladder tumor  DISPOSITION OF SPECIMEN:  PATHOLOGY  COUNTS:  YES  TOURNIQUET:  * No tourniquets in log *  DICTATION: .Other Dictation: Dictation Number [id number]  PLAN OF CARE: Discharge to home after PACU  PATIENT DISPOSITION:  PACU - hemodynamically stable.   Delay start of Pharmacological VTE agent (>24hrs) due to surgical blood loss or risk of bleeding: yes

## 2021-01-21 NOTE — Transfer of Care (Addendum)
Immediate Anesthesia Transfer of Care Note  Patient: [name]  Procedure(s) Performed: Procedure(s) (LRB): CYSTOSCOPY WITH BIOPSY (N/A) CYSTOSCOPY WITH FULGERATION (N/A) CYSTOSCOPY WITH RETROGRADE PYELOGRAM (Bilateral)  Patient Location: PACU  Anesthesia Type: General  Level of Consciousness: awake, sedated, patient cooperative and responds to stimulation  Airway & Oxygen Therapy: Patient Spontanous Breathing and Patient on RA with soft FM  Post-op Assessment: Report given to PACU RN, Post -op Vital signs reviewed and stable and Patient moving all extremities  Post vital signs: Reviewed and stable  Complications: No apparent anesthesia complications

## 2021-01-21 NOTE — H&P (Signed)
[name] is an 75 y.o. female.    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 Large trigone and left wall components. No hydro or pelvic adenopathy by CT Urogram.    Summarized Recent Course:   10/2013 - TaG1 with muscle in specimen by massive volume TURBT; 01/2014 reTURBT TaG1  06/2014 - multifocal small recurrence --> 07/2014 TURBT + Mito C T1G1 with muscle in specimen --> induction BCG x6  11/2016 - cysto multifocal small volume recurrence --> TaG1 by TURBT / Retrogrades; 02/2017 Surv Cysto NED  06/2019 - 09/2019 robotic LEFT distal ureterectomy / psoas hitch reimplant into dome for distal low grade ureteral cancer  02/2020 - cysto no recurrence.  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    PMH sig for TNA, foot surgery. NO CV disease. NO strong blood thinners. NO chest or abdominal surgeries. She gets primary care through [facility] on [address]    Today "[name]" is seen to proceed with bladder biposy / fulgeration to r/o high grade bladder cancer and pre-trial screening for Mito-C induction trial.  No interval fevers. Most recent UA without infectious parameters.    Past Medical History:  Diagnosis Date   Bladder cancer ([place]) RECURRENT    UROLOGIST-  DR [facility]   Duplex kidney    Duplex kidney    RIGHT   History of adenomatous polyp of colon    History of recurrent UTIs    Hyperlipidemia    Hypertension    Wears contact lenses     Past Surgical History:  Procedure Laterality Date   BENIGN BREAST BX Right 1995   BUNIONECTOMY Right 2000   bunionectomy left  foot  04/2016   COLONOSCOPY W/ POLYPECTOMY  01/06/2013   single sessile polyp ascending colon.  [facility].   CYSTOSCOPY W/ RETROGRADES Bilateral 12/10/2013   Procedure: RETROGRADE PYELOGRAM  ;  Surgeon: Alexis Frock, MD;  Location: [facility];  Service: Urology;  Laterality: Bilateral;   CYSTOSCOPY W/ RETROGRADES Bilateral 07/15/2014   Procedure: CYSTOSCOPY WITH RETROGRADE PYELOGRAM AND INSTILLATION OF MITOMYCIN C;  Surgeon: Alexis Frock, MD;  Location: [facility];  Service: Urology;  Laterality: Bilateral;   CYSTOSCOPY W/ RETROGRADES Bilateral 12/31/2015   Procedure: CYSTOSCOPY WITH RETROGRADE PYELOGRAM;  Surgeon: Alexis Frock, MD;  Location: [facility];  Service: Urology;  Laterality: Bilateral;   CYSTOSCOPY W/ RETROGRADES Bilateral 11/17/2016   Procedure: CYSTOSCOPY WITH RETROGRADE PYELOGRAM;  Surgeon: Alexis Frock, MD;  Location: [facility];  Service: Urology;  Laterality: Bilateral;   CYSTOSCOPY W/ RETROGRADES Bilateral 06/13/2019   Procedure: CYSTOSCOPY WITH RETROGRADE PYELOGRAM;  Surgeon: Alexis Frock, MD;  Location: [facility];  Service: Urology;  Laterality: Bilateral;   CYSTOSCOPY W/ URETERAL STENT PLACEMENT Bilateral 10/22/2013   Procedure: CYSTOSCOPY WITH RETROGRADE PYELOGRAM;  Surgeon: Alexis Frock, MD;  Location: [facility];  Service: Urology;  Laterality: Bilateral;   CYSTOSCOPY W/ URETERAL STENT PLACEMENT Left 10/03/2019   Procedure: CYSTOSCOPY WITH STENT REPLACEMENT;  Surgeon: Alexis Frock, MD;  Location: [facility];  Service: Urology;  Laterality: Left;   CYSTOSCOPY WITH RETROGRADE PYELOGRAM, URETEROSCOPY AND STENT PLACEMENT Bilateral 04/14/2015   Procedure: CYSTOSCOPY WITH RETROGRADE PYELOGRAM, URETEROSCOPY AND RIGHT STENT PLACEMENT;  Surgeon: Alexis Frock, MD;  Location: [facility];  Service: Urology;  Laterality: Bilateral;   TONSILLECTOMY  28   (age 17)    TRANSURETHRAL RESECTION OF BLADDER TUMOR N/A 12/31/2015   Procedure: TRANSURETHRAL RESECTION OF BLADDER TUMOR (TURBT) WITH GYRUS;  Surgeon: Alexis Frock, MD;  Location: [facility];  Service: Urology;  Laterality: N/A;   TRANSURETHRAL RESECTION OF BLADDER TUMOR N/A 11/17/2016   Procedure: TRANSURETHRAL RESECTION OF BLADDER TUMOR (TURBT);  Surgeon: Alexis Frock, MD;  Location: [facility];  Service: Urology;  Laterality: N/A;   TRANSURETHRAL RESECTION OF BLADDER TUMOR N/A 06/13/2019   Procedure: TRANSURETHRAL RESECTION OF BLADDER TUMOR (TURBT). LEFT DIAGNOSTIC URETEROSCOPY AND LEFT STENT PLACEMENT;  Surgeon: Alexis Frock, MD;  Location: [facility];  Service: Urology;  Laterality: N/A;  45 MINS   TRANSURETHRAL RESECTION OF BLADDER TUMOR WITH GYRUS (TURBT-GYRUS) N/A 10/22/2013   Procedure: TRANSURETHRAL RESECTION OF BLADDER TUMOR WITH ERBE.;  Surgeon: Alexis Frock, MD;  Location: [facility];  Service: Urology;  Laterality: N/A;   TRANSURETHRAL RESECTION OF BLADDER TUMOR WITH GYRUS (TURBT-GYRUS) N/A 12/10/2013   Procedure: RESTAGING TRANSURETHRAL RESECTION OF BLADDER TUMOR WITH GYRUS (TURBT-GYRUS);  Surgeon: Alexis Frock, MD;  Location: [facility];  Service: Urology;  Laterality: N/A;   TRANSURETHRAL RESECTION OF BLADDER TUMOR WITH GYRUS (TURBT-GYRUS) N/A 07/15/2014   Procedure: TRANSURETHRAL RESECTION OF BLADDER TUMOR WITH GYRUS (TURBT-GYRUS);  Surgeon: Alexis Frock, MD;  Location: [facility];  Service: Urology;  Laterality: N/A;   TRANSURETHRAL RESECTION OF BLADDER TUMOR WITH GYRUS (TURBT-GYRUS) N/A 04/14/2015   Procedure: TRANSURETHRAL RESECTION OF BLADDER TUMOR WITH GYRUS (TURBT-GYRUS);  Surgeon: Alexis Frock, MD;  Location: [facility];  Service: Urology;  Laterality: N/A;    Family History  Problem Relation Age of Onset   Hypertension Mother    Arthritis Mother        knee  replacement; shoulder replacement   Heart disease Mother        AAA; mild AMI in 2018   Stroke Mother 70       TIA   Cancer Father        prostate cancer   Cancer Brother        prostate cancer   Cancer Brother 40       Melanoma   Mental illness Sister 76       bipolar disorder   Social History:  reports that she quit smoking about 8 years ago. Her smoking use included cigarettes. She has a 3.00 pack-year smoking history. She has never used smokeless tobacco. She reports current alcohol use. She reports that she does not use drugs.  Allergies:  Allergies  Allergen Reactions   Hycodan [Hydrocodone Bit-Homatrop Mbr] Hives    No medications prior to admission.    No results found for this or any previous visit (from the past 48 hour(s)). No results found.  Review of Systems  Constitutional:  Negative for chills and fever.  All other systems reviewed and are negative.  Height 5\' 11"  (1.803 m), weight 97.1 kg. Physical Exam Vitals reviewed.  Constitutional:      Comments: Pleasant, at baseline.   HENT:     Head: Normocephalic.     Mouth/Throat:     Mouth: Mucous membranes are moist.  Cardiovascular:     Rate and Rhythm: Normal rate.  Pulmonary:     Effort: Pulmonary effort is normal.  Abdominal:     Comments: Stable truncal obesity.   Genitourinary:    Comments: No CVAT at present Musculoskeletal:        General: Normal range of motion.     Cervical back: Normal range of motion.  Skin:    General: Skin is warm.  Neurological:     General: No focal deficit present.     Mental Status: She is alert.  Psychiatric:        Mood and Affect: Mood normal.     Assessment/Plan  Proceed as planned with cysto, retrogrades, bladder biospy / fulgeration. Risks, benefits, alternatives, expected peri-op course discussed previousliy and reiterated today.   Alexis Frock, MD 01/21/2021, 6:53 AM

## 2021-01-21 NOTE — Anesthesia Procedure Notes (Signed)
Procedure Name: LMA Insertion Date/Time: 01/21/2021 2:09 PM Performed by: Justice Rocher, CRNA Pre-anesthesia Checklist: Patient identified, Emergency Drugs available, Suction available, Patient being monitored and Timeout performed Patient Re-evaluated:Patient Re-evaluated prior to induction Oxygen Delivery Method: Circle system utilized Preoxygenation: Pre-oxygenation with 100% oxygen Induction Type: IV induction Ventilation: Mask ventilation without difficulty LMA: LMA inserted LMA Size: 4.0 Number of attempts: 1 Airway Equipment and Method: Bite block Placement Confirmation: positive ETCO2, breath sounds checked- equal and bilateral and CO2 detector Tube secured with: Tape Dental Injury: Teeth and Oropharynx as per pre-operative assessment

## 2021-01-22 NOTE — Op Note (Signed)
NAME: [name], POARCH MEDICAL RECORD NO: [id number] ACCOUNT NO: [id number] DATE OF BIRTH: Feb 08, 1946 FACILITY: [facility] LOCATION: [facility] PHYSICIAN: Alexis Frock, MD  Operative Report   DATE OF PROCEDURE: 01/21/2021  PREOPERATIVE DIAGNOSIS:  Recurrent bladder cancer.  PROCEDURE PERFORMED:   1.  Cystoscopy with bilateral retrograde pyelograms, interpretation. 2.  Bladder biopsy, fulguration.  ESTIMATED BLOOD LOSS:  Nil.  COMPLICATIONS:  None.  SPECIMEN:  Bladder tumor for pathology.  FINDINGS:   1.  Small volume papillary bladder tumor, mostly in the trigone region lateral to the right ureteral orifice.  Total surface area less than 2 cm. 2.  Stable duplex right ureter. 3.  Unremarkable left retrograde pyelogram, reimplanted left ureteral orifice into dome of the bladder.  INDICATIONS:  The patient is a quite pleasant 75 year old lady with longstanding history of recurrent bladder cancer that has fortunately been low grade previously.  She is status post numerous transurethral resections as well as a left distal ureteral  resection for left distal ureteral involvement.  Fortunately, her pathology otherwise were made low grade.  She has had a small volume bladder recurrence on office cystoscopy and she presents today for further characterization of this.  Informed consent  was signed and placed in medical record.  PROCEDURE IN DETAIL:  The patient is being herself verified, procedure being cystoscopy,  bladder biopsy, fulguration, and bilateral retrograde pyelograms confirmed.  Procedure timeout was performed.  Intravenous antibiotics were administered.  General  LMA anesthesia was induced.  The patient was placed into a low lithotomy position.  Sterile field was created, prepping and draping the patient's vagina, introitus, and proximal thighs using iodine.  Cystourethroscopy was performed using a 21-French  rigid cystoscope with offset lens.  Inspection of urinary bladder  revealed no calcifications.  The left ureteral orifice was resected with a neo orifice at the left dome.  The right ureteral orifice was duplex.  There was a small volume recurrent  papillary bladder tumor, several foci in the area just lateral to the right ureteral orifice.  No additional lesions were seen.  Attention was initially directed at retrograde pyelograms.  The right ureteral orifice was cannulated with a 6-French renal  catheter and right retrograde pyelogram was obtained.  Right retrograde pyelogram revealed a duplex right ureter throughout its length with the ureteral orifices essentially side by side in the bladder.  No filling defects or narrowing noted.  Next, left retrograde pyelogram was obtained via the neo orifice  at the bladder dome, left retrograde pyelogram revealed single left ureter, single system left kidney.  Next, using cold cup biopsy forceps, representative biopsies were performed of the papillary tumor near the area of the right ureteral orifice.  This  was set aside for permanent pathology labeled as such and the base of this was fulgurated.  Procedure was then terminated.  The patient tolerated procedure well.  No immediate complications.  The patient was taken to postanesthesia care unit in stable  condition.  Plan for discharge home.   PAA D: 01/21/2021 2:32:56 pm T: 01/22/2021 1:05:00 am  JOB: [id number]/ [id number]

## 2021-01-24 ENCOUNTER — Encounter (HOSPITAL_BASED_OUTPATIENT_CLINIC_OR_DEPARTMENT_OTHER): Payer: Self-pay | Admitting: Urology

## 2021-01-24 LAB — SURGICAL PATHOLOGY

## 2021-01-25 ENCOUNTER — Other Ambulatory Visit: Payer: Self-pay | Admitting: Internal Medicine

## 2021-01-25 DIAGNOSIS — I10 Essential (primary) hypertension: Secondary | ICD-10-CM

## 2021-01-25 DIAGNOSIS — E7800 Pure hypercholesterolemia, unspecified: Secondary | ICD-10-CM

## 2021-01-25 NOTE — Telephone Encounter (Signed)
Please refill as per office routine med refill policy (all routine meds to be refilled for 3 mo or monthly (per pt preference) up to one year from last visit, then month to month grace period for 3 mo, then further med refills will have to be denied) ? ?

## 2021-01-27 NOTE — Anesthesia Postprocedure Evaluation (Signed)
Anesthesia Post Note  Patient: [name]  Procedure(s) Performed: CYSTOSCOPY WITH BIOPSY (Bladder) CYSTOSCOPY WITH FULGERATION (Bladder) CYSTOSCOPY WITH RETROGRADE PYELOGRAM (Bilateral: Pelvis)     Patient location during evaluation: PACU Anesthesia Type: General Level of consciousness: awake and alert Pain management: pain level controlled Vital Signs Assessment: post-procedure vital signs reviewed and stable Respiratory status: spontaneous breathing, nonlabored ventilation, respiratory function stable and patient connected to nasal cannula oxygen Cardiovascular status: blood pressure returned to baseline and stable Postop Assessment: no apparent nausea or vomiting Anesthetic complications: no   No notable events documented.  Last Vitals:  Vitals:   01/21/21 1530 01/21/21 1600  BP: 132/78 133/82  Pulse: 73 76  Resp: (!) 9 12  Temp:  36.5 C  SpO2: 95% 99%    Last Pain:  Vitals:   01/24/21 1528  TempSrc:   PainSc: 0-No pain                 Neng Albee

## 2021-02-03 DIAGNOSIS — Q630 Accessory kidney: Secondary | ICD-10-CM | POA: Diagnosis not present

## 2021-02-03 DIAGNOSIS — C679 Malignant neoplasm of bladder, unspecified: Secondary | ICD-10-CM | POA: Diagnosis not present

## 2021-02-03 DIAGNOSIS — K7689 Other specified diseases of liver: Secondary | ICD-10-CM | POA: Diagnosis not present

## 2021-02-03 DIAGNOSIS — N281 Cyst of kidney, acquired: Secondary | ICD-10-CM | POA: Diagnosis not present

## 2021-02-20 ENCOUNTER — Other Ambulatory Visit: Payer: Self-pay | Admitting: Internal Medicine

## 2021-02-20 DIAGNOSIS — I10 Essential (primary) hypertension: Secondary | ICD-10-CM

## 2021-02-24 ENCOUNTER — Other Ambulatory Visit: Payer: Self-pay | Admitting: Internal Medicine

## 2021-02-24 DIAGNOSIS — E7800 Pure hypercholesterolemia, unspecified: Secondary | ICD-10-CM

## 2021-02-24 NOTE — Telephone Encounter (Signed)
Please refill as per office routine med refill policy (all routine meds to be refilled for 3 mo or monthly (per pt preference) up to one year from last visit, then month to month grace period for 3 mo, then further med refills will have to be denied) ? ?

## 2021-02-24 NOTE — Telephone Encounter (Signed)
Sorry- I should forward to PCP instead, thanks

## 2021-03-15 ENCOUNTER — Ambulatory Visit: Payer: Medicare Other | Admitting: Internal Medicine

## 2021-04-07 ENCOUNTER — Ambulatory Visit (INDEPENDENT_AMBULATORY_CARE_PROVIDER_SITE_OTHER): Payer: Medicare Other | Admitting: Internal Medicine

## 2021-04-07 ENCOUNTER — Other Ambulatory Visit: Payer: Self-pay

## 2021-04-07 ENCOUNTER — Encounter: Payer: Self-pay | Admitting: Internal Medicine

## 2021-04-07 VITALS — BP 134/82 | HR 85 | Temp 98.1°F | Resp 16 | Ht 71.0 in | Wt 221.0 lb

## 2021-04-07 DIAGNOSIS — I10 Essential (primary) hypertension: Secondary | ICD-10-CM

## 2021-04-07 DIAGNOSIS — E7800 Pure hypercholesterolemia, unspecified: Secondary | ICD-10-CM

## 2021-04-07 DIAGNOSIS — N1831 Chronic kidney disease, stage 3a: Secondary | ICD-10-CM

## 2021-04-07 DIAGNOSIS — Z23 Encounter for immunization: Secondary | ICD-10-CM | POA: Diagnosis not present

## 2021-04-07 DIAGNOSIS — R7303 Prediabetes: Secondary | ICD-10-CM | POA: Diagnosis not present

## 2021-04-07 LAB — BASIC METABOLIC PANEL
BUN: 18 mg/dL (ref 6–23)
CO2: 29 mEq/L (ref 19–32)
Calcium: 9.7 mg/dL (ref 8.4–10.5)
Chloride: 100 mEq/L (ref 96–112)
Creatinine, Ser: 0.91 mg/dL (ref 0.40–1.20)
GFR: 62.59 mL/min (ref >60.00)
Glucose, Bld: 95 mg/dL (ref 70–99)
Potassium: 3.6 mEq/L (ref 3.5–5.1)
Sodium: 137 mEq/L (ref 135–145)

## 2021-04-07 LAB — URINALYSIS, ROUTINE W REFLEX MICROSCOPIC
Bilirubin Urine: NEGATIVE
Hgb urine dipstick: NEGATIVE
Ketones, ur: NEGATIVE
Nitrite: NEGATIVE
RBC / HPF: NONE SEEN (ref 0–?)
Specific Gravity, Urine: 1.01 (ref 1.000–1.030)
Total Protein, Urine: NEGATIVE
Urine Glucose: NEGATIVE
Urobilinogen, UA: 0.2 (ref 0.0–1.0)
pH: 7 (ref 5.0–8.0)

## 2021-04-07 LAB — HEMOGLOBIN A1C: Hgb A1c MFr Bld: 6 % (ref 4.6–6.5)

## 2021-04-07 MED ORDER — AMLODIPINE BESYLATE 10 MG PO TABS: 10.0000 mg | ORAL_TABLET | Freq: Every day | ORAL | 1 refills | Status: DC

## 2021-04-07 MED ORDER — ATORVASTATIN CALCIUM 40 MG PO TABS: 40.0000 mg | ORAL_TABLET | Freq: Every day | ORAL | 1 refills | Status: DC

## 2021-04-07 NOTE — Progress Notes (Signed)
Subjective:  Patient ID: [name], female    DOB: February 27, 1946  Age: 75 y.o. MRN: [id number]  CC: Hypertension  This visit occurred during the SARS-CoV-2 public health emergency.  Safety protocols were in place, including screening questions prior to the visit, additional usage of staff PPE, and extensive cleaning of exam room while observing appropriate contact time as indicated for disinfecting solutions.    HPI [name] presents for f/up -  She tells me that her BP is well controlled. She is active and denies CP, DOE, SOB, edema, or fatigue.  Outpatient Medications Prior to Visit  Medication Sig Dispense Refill   triamterene-hydrochlorothiazide (MAXZIDE-25) 37.5-25 MG tablet TAKE 1 TABLET BY MOUTH EVERY DAY 30 tablet 0   amLODipine (NORVASC) 10 MG tablet TAKE 1 TABLET BY MOUTH EVERY DAY 90 tablet 0   atorvastatin (LIPITOR) 40 MG tablet TAKE 1 TABLET BY MOUTH EVERY DAY 30 tablet 0   traMADol (ULTRAM) 50 MG tablet Take 1 tablet (50 mg total) by mouth every 6 (six) hours as needed for moderate pain or severe pain (post-operatively). 10 tablet 0   No facility-administered medications prior to visit.    ROS Review of Systems  Constitutional:  Negative for diaphoresis and fatigue.  HENT: Negative.    Eyes: Negative.   Respiratory:  Negative for cough, chest tightness, shortness of breath and wheezing.   Cardiovascular:  Negative for chest pain, palpitations and leg swelling.  Gastrointestinal:  Negative for abdominal pain, diarrhea, nausea and vomiting.  Endocrine: Negative.   Genitourinary: Negative.  Negative for difficulty urinating.  Musculoskeletal: Negative.  Negative for arthralgias and myalgias.  Skin: Negative.  Negative for color change and pallor.  Neurological:  Negative for dizziness, weakness, light-headedness and numbness.  Hematological:  Negative for adenopathy. Does not bruise/bleed easily.  Psychiatric/Behavioral: Negative.     Objective:   BP 134/82 (BP Location: Left Arm, Patient Position: Sitting, Cuff Size: Large)    Pulse 85    Temp 98.1 F (36.7 C) (Oral)    Resp 16    Ht 5\' 11"  (1.803 m)    Wt 221 lb (100.2 kg)    SpO2 97%    BMI 30.82 kg/m   BP Readings from Last 3 Encounters:  04/07/21 134/82  01/21/21 133/82  09/09/20 134/86    Wt Readings from Last 3 Encounters:  04/07/21 221 lb (100.2 kg)  01/21/21 218 lb 3.2 oz (99 kg)  09/09/20 216 lb (98 kg)    Physical Exam Vitals reviewed.  HENT:     Nose: Nose normal.     Mouth/Throat:     Mouth: Mucous membranes are moist.  Eyes:     General: No scleral icterus.    Conjunctiva/sclera: Conjunctivae normal.  Cardiovascular:     Rate and Rhythm: Normal rate and regular rhythm.     Heart sounds: No murmur heard. Pulmonary:     Effort: Pulmonary effort is normal.     Breath sounds: No stridor. No wheezing, rhonchi or rales.  Abdominal:     General: Abdomen is flat.     Palpations: There is no mass.     Tenderness: There is no abdominal tenderness. There is no guarding.     Hernia: No hernia is present.  Musculoskeletal:     Cervical back: Neck supple.     Right lower leg: No edema.     Left lower leg: No edema.  Lymphadenopathy:     Cervical: No cervical adenopathy.  Skin:  General: Skin is warm and dry.     Findings: No rash.  Neurological:     General: No focal deficit present.     Mental Status: She is alert.    Lab Results  Component Value Date   WBC 6.4 04/20/2020   HGB 13.3 01/21/2021   HCT 39.0 01/21/2021   PLT 360 04/20/2020   GLUCOSE 95 04/07/2021   CHOL 179 04/20/2020   TRIG 229 (H) 04/20/2020   HDL 51 04/20/2020   LDLCALC 90 04/20/2020   ALT 29 04/20/2020   AST 27 04/20/2020   NA 137 04/07/2021   K 3.6 04/07/2021   CL 100 04/07/2021   CREATININE 0.91 04/07/2021   BUN 18 04/07/2021   CO2 29 04/07/2021   TSH 1.58 09/09/2020   INR 1.4 05/05/2008   HGBA1C 6.0 04/07/2021    No results found.  Assessment & Plan:    [name] was seen today for hypertension.  Diagnoses and all orders for this visit:  Stage 3a chronic kidney disease ([place])- Her renal function is normal now. -     Urinalysis, Routine w reflex microscopic; Future -     Urinalysis, Routine w reflex microscopic  Pure hypercholesterolemia- LDL goal achieved. Doing well on the statin  -     atorvastatin (LIPITOR) 40 MG tablet; Take 1 tablet (40 mg total) by mouth daily.  Essential hypertension, benign- Her blood pressure is adequately well controlled.  Electrolytes and renal function are normal. -     amLODipine (NORVASC) 10 MG tablet; Take 1 tablet (10 mg total) by mouth daily. -     Basic metabolic panel; Future -     Urinalysis, Routine w reflex microscopic; Future -     Urinalysis, Routine w reflex microscopic -     Basic metabolic panel  Prediabetes- Her A1c is at 6.0%.  Medical therapy is not indicated. -     Basic metabolic panel; Future -     Hemoglobin A1c; Future -     Hemoglobin A1c -     Basic metabolic panel  Flu vaccine need -     Flu Vaccine QUAD High Dose(Fluad)   I have discontinued [name] "[name]"'s traMADol. I have also changed her atorvastatin and amLODipine. Additionally, I am having her maintain her triamterene-hydrochlorothiazide.  Meds ordered this encounter  Medications   atorvastatin (LIPITOR) 40 MG tablet    Sig: Take 1 tablet (40 mg total) by mouth daily.    Dispense:  90 tablet    Refill:  1   amLODipine (NORVASC) 10 MG tablet    Sig: Take 1 tablet (10 mg total) by mouth daily.    Dispense:  90 tablet    Refill:  1     Follow-up: Return in about 6 months (around 10/06/2021).  Alexis Calico, MD

## 2021-04-07 NOTE — Patient Instructions (Signed)

## 2021-04-19 ENCOUNTER — Ambulatory Visit: Payer: Medicare Other | Admitting: Dermatology

## 2021-04-19 ENCOUNTER — Other Ambulatory Visit: Payer: Self-pay

## 2021-04-19 DIAGNOSIS — Z1283 Encounter for screening for malignant neoplasm of skin: Secondary | ICD-10-CM | POA: Diagnosis not present

## 2021-04-19 DIAGNOSIS — D1801 Hemangioma of skin and subcutaneous tissue: Secondary | ICD-10-CM | POA: Diagnosis not present

## 2021-04-19 DIAGNOSIS — L720 Epidermal cyst: Secondary | ICD-10-CM | POA: Diagnosis not present

## 2021-04-19 DIAGNOSIS — L821 Other seborrheic keratosis: Secondary | ICD-10-CM | POA: Diagnosis not present

## 2021-04-19 DIAGNOSIS — L918 Other hypertrophic disorders of the skin: Secondary | ICD-10-CM | POA: Diagnosis not present

## 2021-04-21 ENCOUNTER — Other Ambulatory Visit: Payer: Self-pay

## 2021-04-21 ENCOUNTER — Ambulatory Visit (INDEPENDENT_AMBULATORY_CARE_PROVIDER_SITE_OTHER): Payer: Medicare Other

## 2021-04-21 VITALS — BP 120/68 | HR 69 | Temp 98.0°F | Resp 16 | Ht 71.0 in | Wt 221.2 lb

## 2021-04-21 DIAGNOSIS — Z13820 Encounter for screening for osteoporosis: Secondary | ICD-10-CM | POA: Diagnosis not present

## 2021-04-21 DIAGNOSIS — Z0000 Encounter for general adult medical examination without abnormal findings: Secondary | ICD-10-CM

## 2021-04-21 NOTE — Progress Notes (Signed)
Subjective:   BABYGIRL TRAGER is a 76 y.o. female who presents for Medicare Annual (Subsequent) preventive examination.  Review of Systems     Cardiac Risk Factors include: advanced age (>75men, >59 women);family history of premature cardiovascular disease;dyslipidemia;hypertension;obesity (BMI >30kg/m2)     Objective:    Today's Vitals   04/21/21 0929  BP: 120/68  Pulse: 69  Resp: 16  Temp: 98 F (36.7 C)  SpO2: 97%  Weight: 221 lb 3.2 oz (100.3 kg)  Height: 5\' 11"  (1.803 m)  PainSc: 0-No pain   Body mass index is 30.85 kg/m.  Advanced Directives 04/21/2021 01/21/2021 04/12/2020 10/03/2019 10/03/2019 09/19/2019 06/13/2019  Does Patient Have a Medical Advance Directive? Yes Yes Yes Yes Yes Yes Yes  Type of Advance Directive Living will;Healthcare Power of [place];Living will [place];Living will [place];Living will -  Does patient want to make changes to medical advance directive? No - Patient declined - No - Patient declined No - Patient declined - - No - Patient declined  Copy of [place] in Chart? No - copy requested - No - copy requested No - copy requested - - -  Would patient like information on creating a medical advance directive? - - - - - - -    Current Medications (verified) Outpatient Encounter Medications as of 04/21/2021  Medication Sig   amLODipine (NORVASC) 10 MG tablet Take 1 tablet (10 mg total) by mouth daily.   atorvastatin (LIPITOR) 40 MG tablet Take 1 tablet (40 mg total) by mouth daily.   triamterene-hydrochlorothiazide (MAXZIDE-25) 37.5-25 MG tablet TAKE 1 TABLET BY MOUTH EVERY DAY   No facility-administered encounter medications on file as of 04/21/2021.    Allergies (verified) Hycodan [hydrocodone bit-homatrop mbr]   History: Past Medical History:  Diagnosis Date   Bladder cancer ([place]) RECURRENT    UROLOGIST-  [facility]    Duplex kidney    Duplex kidney    RIGHT   History of adenomatous polyp of colon    History of recurrent UTIs    Hyperlipidemia    Hypertension    Wears contact lenses    Past Surgical History:  Procedure Laterality Date   BENIGN BREAST BX Right 1995   BUNIONECTOMY Right 2000   bunionectomy left foot  04/2016   COLONOSCOPY W/ POLYPECTOMY  01/06/2013   single sessile polyp ascending colon.  [facility].   CYSTOSCOPY W/ RETROGRADES Bilateral 12/10/2013   Procedure: RETROGRADE PYELOGRAM  ;  Surgeon: Alexis Frock, MD;  Location: [facility];  Service: Urology;  Laterality: Bilateral;   CYSTOSCOPY W/ RETROGRADES Bilateral 07/15/2014   Procedure: CYSTOSCOPY WITH RETROGRADE PYELOGRAM AND INSTILLATION OF MITOMYCIN C;  Surgeon: Alexis Frock, MD;  Location: [facility];  Service: Urology;  Laterality: Bilateral;   CYSTOSCOPY W/ RETROGRADES Bilateral 12/31/2015   Procedure: CYSTOSCOPY WITH RETROGRADE PYELOGRAM;  Surgeon: Alexis Frock, MD;  Location[facility];  Service: Urology;  Laterality: Bilateral;   CYSTOSCOPY W/ RETROGRADES Bilateral 11/17/2016   Procedure: CYSTOSCOPY WITH RETROGRADE PYELOGRAM;  Surgeon: Alexis Frock, MD;  Location: [facility];  Service: Urology;  Laterality: Bilateral;   CYSTOSCOPY W/ RETROGRADES Bilateral 06/13/2019   Procedure: CYSTOSCOPY WITH RETROGRADE PYELOGRAM;  Surgeon: Alexis Frock, MD;  Location: [facility];  Service: Urology;  Laterality: Bilateral;   CYSTOSCOPY W/ RETROGRADES Bilateral 01/21/2021   Procedure: CYSTOSCOPY WITH RETROGRADE PYELOGRAM;  Surgeon: Tresa Moore,  Hubbard Robinson, MD;  Location: [facility];  Service: Urology;  Laterality: Bilateral;   CYSTOSCOPY W/ URETERAL STENT PLACEMENT Bilateral 10/22/2013   Procedure: CYSTOSCOPY WITH RETROGRADE PYELOGRAM;  Surgeon: Alexis Frock, MD;  Location: [facility];  Service: Urology;  Laterality: Bilateral;    CYSTOSCOPY W/ URETERAL STENT PLACEMENT Left 10/03/2019   Procedure: CYSTOSCOPY WITH STENT REPLACEMENT;  Surgeon: Alexis Frock, MD;  Location: [facility];  Service: Urology;  Laterality: Left;   CYSTOSCOPY WITH BIOPSY N/A 01/21/2021   Procedure: CYSTOSCOPY WITH BIOPSY;  Surgeon: Alexis Frock, MD;  Location: [facility];  Service: Urology;  Laterality: N/A;   CYSTOSCOPY WITH FULGERATION N/A 01/21/2021   Procedure: CYSTOSCOPY WITH FULGERATION;  Surgeon: Alexis Frock, MD;  Location: [facility];  Service: Urology;  Laterality: N/A;   CYSTOSCOPY WITH RETROGRADE PYELOGRAM, URETEROSCOPY AND STENT PLACEMENT Bilateral 04/14/2015   Procedure: CYSTOSCOPY WITH RETROGRADE PYELOGRAM, URETEROSCOPY AND RIGHT STENT PLACEMENT;  Surgeon: Alexis Frock, MD;  Location: [facility];  Service: Urology;  Laterality: Bilateral;   TONSILLECTOMY  65   (age 69)   TRANSURETHRAL RESECTION OF BLADDER TUMOR N/A 12/31/2015   Procedure: TRANSURETHRAL RESECTION OF BLADDER TUMOR (TURBT) WITH GYRUS;  Surgeon: Alexis Frock, MD;  Location: [facility];  Service: Urology;  Laterality: N/A;   TRANSURETHRAL RESECTION OF BLADDER TUMOR N/A 11/17/2016   Procedure: TRANSURETHRAL RESECTION OF BLADDER TUMOR (TURBT);  Surgeon: Alexis Frock, MD;  Location: [facility];  Service: Urology;  Laterality: N/A;   TRANSURETHRAL RESECTION OF BLADDER TUMOR N/A 06/13/2019   Procedure: TRANSURETHRAL RESECTION OF BLADDER TUMOR (TURBT). LEFT DIAGNOSTIC URETEROSCOPY AND LEFT STENT PLACEMENT;  Surgeon: Alexis Frock, MD;  Location: [facility];  Service: Urology;  Laterality: N/A;  45 MINS   TRANSURETHRAL RESECTION OF BLADDER TUMOR WITH GYRUS (TURBT-GYRUS) N/A 10/22/2013   Procedure: TRANSURETHRAL RESECTION OF BLADDER TUMOR WITH ERBE.;  Surgeon: Alexis Frock, MD;  Location: [facility];  Service: Urology;  Laterality: N/A;   TRANSURETHRAL RESECTION OF  BLADDER TUMOR WITH GYRUS (TURBT-GYRUS) N/A 12/10/2013   Procedure: RESTAGING TRANSURETHRAL RESECTION OF BLADDER TUMOR WITH GYRUS (TURBT-GYRUS);  Surgeon: Alexis Frock, MD;  Location: [facility];  Service: Urology;  Laterality: N/A;   TRANSURETHRAL RESECTION OF BLADDER TUMOR WITH GYRUS (TURBT-GYRUS) N/A 07/15/2014   Procedure: TRANSURETHRAL RESECTION OF BLADDER TUMOR WITH GYRUS (TURBT-GYRUS);  Surgeon: Alexis Frock, MD;  Location: [facility];  Service: Urology;  Laterality: N/A;   TRANSURETHRAL RESECTION OF BLADDER TUMOR WITH GYRUS (TURBT-GYRUS) N/A 04/14/2015   Procedure: TRANSURETHRAL RESECTION OF BLADDER TUMOR WITH GYRUS (TURBT-GYRUS);  Surgeon: Alexis Frock, MD;  Location[facility]  Service: Urology;  Laterality: N/A;   Family History  Problem Relation Age of Onset   Hypertension Mother    Arthritis Mother        knee replacement; shoulder replacement   Heart disease Mother        AAA; mild AMI in 2018   Stroke Mother 76       TIA   Cancer Father        prostate cancer   Cancer Brother        prostate cancer   Cancer Brother 70       Melanoma   Mental illness Sister 23       bipolar disorder   Social History   Socioeconomic History   Marital status: Divorced    Spouse name: Not on file  Number of children: 3   Years of education: Not on file   Highest education level: Not on file  Occupational History   Occupation: retired    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  Tobacco Use   Smoking status: Former    Packs/day: 0.15    Years: 20.00    Pack years: 3.00    Types: Cigarettes    Quit date: 10/17/2012    Years since quitting: 8.5   Smokeless tobacco: Never  Vaping Use   Vaping Use: Never used  Substance and Sexual Activity   Alcohol use: Yes    Comment: OCCASIONAL   Drug use: No   Sexual activity: Not Currently    Birth control/protection: Post-menopausal  Other Topics Concern    Not on file  Social History Narrative   Marital status:  Divorced; not dating in 2018.      Children:  3 children; 2 grandchildren (5,4); family within walking distance.        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      Tobacco:  None; quit in 09/2012.      Alcohol: 1 drink per week.       Drugs:  None      Exercise:  One hour of water aerobics 3-4 days per week.      Seatbelt: 100%      Guns:  none      ADLs: independent with all ADLs; drives; no assistant device.      Advanced Directives:  Yes; FULL CODE; no prolonged measures.   Social Determinants of Health   Financial Resource Strain: Low Risk    Difficulty of Paying Living Expenses: Not hard at all  Food Insecurity: No Food Insecurity   Worried About Charity fundraiser in the Last Year: Never true   [place] in the Last Year: Never true  Transportation Needs: No Transportation Needs   Lack of Transportation (Medical): No   Lack of Transportation (Non-Medical): No  Physical Activity: Sufficiently Active   Days of Exercise per Week: 7 days   Minutes of Exercise per Session: 30 min  Stress: No Stress Concern Present   Feeling of Stress : Not at all  Social Connections: Moderately Integrated   Frequency of Communication with Friends and Family: More than three times a week   Frequency of Social Gatherings with Friends and Family: Three times a week   Attends Religious Services: 1 to 4 times per year   Active Member of Clubs or Organizations: Yes   Attends Archivist Meetings: 1 to 4 times per year   Marital Status: Divorced    Tobacco Counseling Counseling given: Not Answered   Clinical Intake:  Pre-visit preparation completed: Yes  Pain : No/denies pain Pain Score: 0-No pain     BMI - recorded: 30.85 Nutritional Status: BMI > 30  Obese Nutritional Risks: None Diabetes: No  How often do you need to have someone help you when you  read instructions, pamphlets, or other written materials from your doctor or pharmacy?: 1 - Never What is the last grade level you completed in school?: Bachelor's Degree  Diabetic? no  Interpreter Needed?: No  Information entered by :: Lisette Abu, LPN   Activities of Daily Living In your present state of health, do you have any difficulty performing the following activities: 04/21/2021 01/21/2021  Hearing? N N  Vision?  N N  Difficulty concentrating or making decisions? N N  Walking or climbing stairs? N N  Dressing or bathing? N N  Doing errands, shopping? N -  Preparing Food and eating ? N -  Using the Toilet? N -  In the past six months, have you accidently leaked urine? N -  Do you have problems with loss of bowel control? N -  Managing your Medications? N -  Managing your Finances? N -  Housekeeping or managing your Housekeeping? N -  Some recent data might be hidden    Patient Care Team: Janith Lima, MD as PCP - General (Internal Medicine) Wonda Horner, MD as Consulting Physician (Gastroenterology) Alexis Frock, MD as Consulting Physician (Urology) Lavonna Monarch, MD as Consulting Physician (Dermatology) Greta Doom, OD as Consulting Physician (Optometry)  Indicate any recent Medical Services you may have received from other than [facility] providers in the past year (date may be approximate).     Assessment:   This is a routine wellness examination for [place].  Hearing/Vision screen Hearing Screening - Comments:: Patient denied any hearing difficulty.   No hearing aids.  Vision Screening - Comments:: Patient wears corrective glasses/contacts.  Eye exam done annually by: Ashley Murrain, OD.   Dietary issues and exercise activities discussed: Current Exercise Habits: Home exercise routine;Structured exercise class, Type of exercise: walking;Other - see comments (swimming, group walk, ride bike), Time (Minutes): 60, Frequency (Times/Week): 7,  Weekly Exercise (Minutes/Week): 420, Intensity: Moderate, Exercise limited by: None identified   Goals Addressed               This Visit's Progress     Patient Stated (pt-stated)        My goal is to stay healthy and active.      Depression Screen PHQ 2/9 Scores 04/21/2021 04/12/2020 03/31/2019 10/22/2018 05/17/2018 04/13/2018 03/26/2018  PHQ - 2 Score 0 0 0 0 0 0 0    Fall Risk Fall Risk  04/21/2021 04/12/2020 03/31/2019 10/22/2018 05/17/2018  Falls in the past year? 0 0 0 1 0  Number falls in past yr: 0 0 0 0 0  Injury with Fall? 0 0 0 1 0  Comment - - - wears boot on left foot -  Risk for fall due to : No Fall Risks - - - -  Follow up Falls evaluation completed Falls evaluation completed;Education provided - - Falls evaluation completed    [place]:  Any stairs in or around the home? No  If so, are there any without handrails? No  Home free of loose throw rugs in walkways, pet beds, electrical cords, etc? Yes  Adequate lighting in your home to reduce risk of falls? Yes   ASSISTIVE DEVICES UTILIZED TO PREVENT FALLS:  Life alert? No  Use of a cane, walker or w/c? No  Grab bars in the bathroom? No  Shower chair or bench in shower? No  Elevated toilet seat or a handicapped toilet? No   TIMED UP AND GO:  Was the test performed? Yes .  Length of time to ambulate 10 feet: 6 sec.   Gait steady and fast without use of assistive device  Cognitive Function: Normal cognitive status assessed by direct observation by this Nurse Health Advisor. No abnormalities found.       6CIT Screen 04/12/2020 03/31/2019 03/31/2019 03/26/2018  What Year? 0 points 0 points 0 points 0 points  What month? 0 points 0 points 0  points 0 points  What time? 0 points 0 points 0 points 0 points  Count back from 20 0 points 0 points - 0 points  Months in reverse 0 points 0 points - 0 points  Repeat phrase 0 points 0 points - 0 points  Total Score 0 0 - 0     Immunizations Immunization History  Administered Date(s) Administered   Fluad Quad(high Dose 65+) 03/30/2019, 03/31/2019, 04/20/2020, 04/07/2021   Influenza, High Dose Seasonal PF 01/02/2018   Influenza,inj,Quad PF,6+ Mos 03/02/2014, 12/25/2014, 12/28/2015, 03/21/2017   PFIZER(Purple Top)SARS-COV-2 Vaccination 12/20/2018, 01/09/2019, 01/09/2020, 09/21/2020   PNEUMOCOCCAL CONJUGATE-20 09/09/2020   Pneumococcal Conjugate-13 03/02/2014   Pneumococcal Polysaccharide-23 12/25/2014   Tdap 09/09/2020    TDAP status: Up to date  Flu Vaccine status: Up to date  Pneumococcal vaccine status: Up to date  Covid-19 vaccine status: Completed vaccines  Qualifies for Shingles Vaccine? Yes   Zostavax completed No   Shingrix Completed?: No.    Education has been provided regarding the importance of this vaccine. Patient has been advised to call insurance company to determine out of pocket expense if they have not yet received this vaccine. Advised may also receive vaccine at local pharmacy or Health Dept. Verbalized acceptance and understanding.  Screening Tests Health Maintenance  Topic Date Due   Zoster Vaccines- Shingrix (1 of 2) Never done   COVID-19 Vaccine (5 - Booster for Pfizer series) 11/16/2020   MAMMOGRAM  11/16/2022   COLONOSCOPY (Pts 45-58yrs Insurance coverage will need to be confirmed)  07/22/2029   TETANUS/TDAP  09/10/2030   Pneumonia Vaccine 46+ Years old  Completed   INFLUENZA VACCINE  Completed   DEXA SCAN  Completed   Hepatitis C Screening  Completed   HPV VACCINES  Aged Out    Health Maintenance  Health Maintenance Due  Topic Date Due   Zoster Vaccines- Shingrix (1 of 2) Never done   COVID-19 Vaccine (5 - Booster for Pfizer series) 11/16/2020    Colorectal cancer screening: Type of screening: Colonoscopy. Completed 07/23/2019. Repeat every 10 years  Mammogram status: Completed 11/15/2020. Repeat every year  Bone Density status: Completed 01/15/2015. Results  reflect: Bone density results: NORMAL. Repeat every 5 years. (Order placed 04/21/2021)  Lung Cancer Screening: (Low Dose CT Chest recommended if Age 43-80 years, 30 pack-year currently smoking OR have quit w/in 15years.) does not qualify.   Lung Cancer Screening Referral: no  Additional Screening:  Hepatitis C Screening: does qualify; Completed: yes  Vision Screening: Recommended annual ophthalmology exams for early detection of glaucoma and other disorders of the eye. Is the patient up to date with their annual eye exam?  Yes  Who is the provider or what is the name of the office in which the patient attends annual eye exams? Ashley Murrain, OD If pt is not established with a provider, would they like to be referred to a provider to establish care? No .   Dental Screening: Recommended annual dental exams for proper oral hygiene  Community Resource Referral / Chronic Care Management: CRR required this visit?  No   CCM required this visit?  No      Plan:     I have personally reviewed and noted the following in the patients chart:   Medical and social history Use of alcohol, tobacco or illicit drugs  Current medications and supplements including opioid prescriptions.  Functional ability and status Nutritional status Physical activity Advanced directives List of other physicians Hospitalizations, surgeries, and ER visits in  previous 12 months Vitals Screenings to include cognitive, depression, and falls Referrals and appointments  In addition, I have reviewed and discussed with patient certain preventive protocols, quality metrics, and best practice recommendations. A written personalized care plan for preventive services as well as general preventive health recommendations were provided to patient.     Sheral Flow, LPN   [id number]/92/9090   Nurse Notes:  Hearing Screening - Comments:: Patient denied any hearing difficulty.   No hearing aids.  Vision Screening -  Comments:: Patient wears corrective glasses/contacts.  Eye exam done annually by: Ashley Murrain, OD.

## 2021-04-21 NOTE — Patient Instructions (Addendum)
[name] , Thank you for taking time to come for your Medicare Wellness Visit. I appreciate your ongoing commitment to your health goals. Please review the following plan we discussed and let me know if I can assist you in the future.   Screening recommendations/referrals: Colonoscopy: 07/23/2019; due every 10 years Mammogram: 11/15/2020; due every 1-2 years Bone Density: 01/15/2015; due every 5 years Recommended yearly ophthalmology/optometry visit for glaucoma screening and checkup Recommended yearly dental visit for hygiene and checkup  Vaccinations: Influenza vaccine: 04/07/2021 Pneumococcal vaccine: 12/25/2014, 09/09/2020 Tdap vaccine: 09/09/2020; due every 10 years Shingles vaccine: 09/21/2020; need second dose Covid-19: 12/20/2018, 01/09/2019, 01/09/2020  Advanced directives: Please bring a copy of your health care power of attorney and living will to the office at your convenience.  Conditions/risks identified: My goal is to stay healthy and active.  Next appointment: Please schedule your next Medicare Wellness Visit with your Nurse Health Advisor in 1 year by calling [phone].   Preventive Care 70 Years and Older, Female Preventive care refers to lifestyle choices and visits with your health care provider that can promote health and wellness. What does preventive care include? A yearly physical exam. This is also called an annual well check. Dental exams once or twice a year. Routine eye exams. Ask your health care provider how often you should have your eyes checked. Personal lifestyle choices, including: Daily care of your teeth and gums. Regular physical activity. Eating a healthy diet. Avoiding tobacco and drug use. Limiting alcohol use. Practicing safe sex. Taking low-dose aspirin every day. Taking vitamin and mineral supplements as recommended by your health care provider. What happens during an annual well check? The services and screenings done by your health care  provider during your annual well check will depend on your age, overall health, lifestyle risk factors, and family history of disease. Counseling  Your health care provider may ask you questions about your: Alcohol use. Tobacco use. Drug use. Emotional well-being. Home and relationship well-being. Sexual activity. Eating habits. History of falls. Memory and ability to understand (cognition). Work and work Statistician. Reproductive health. Screening  You may have the following tests or measurements: Height, weight, and BMI. Blood pressure. Lipid and cholesterol levels. These may be checked every 5 years, or more frequently if you are over 32 years old. Skin check. Lung cancer screening. You may have this screening every year starting at age 59 if you have a 30-pack-year history of smoking and currently smoke or have quit within the past 15 years. Fecal occult blood test (FOBT) of the stool. You may have this test every year starting at age 39. Flexible sigmoidoscopy or colonoscopy. You may have a sigmoidoscopy every 5 years or a colonoscopy every 10 years starting at age 28. Hepatitis C blood test. Hepatitis B blood test. Sexually transmitted disease (STD) testing. Diabetes screening. This is done by checking your blood sugar (glucose) after you have not eaten for a while (fasting). You may have this done every 1-3 years. Bone density scan. This is done to screen for osteoporosis. You may have this done starting at age 82. Mammogram. This may be done every 1-2 years. Talk to your health care provider about how often you should have regular mammograms. Talk with your health care provider about your test results, treatment options, and if necessary, the need for more tests. Vaccines  Your health care provider may recommend certain vaccines, such as: Influenza vaccine. This is recommended every year. Tetanus, diphtheria, and acellular pertussis (Tdap, Td)  vaccine. You may need a Td  booster every 10 years. Zoster vaccine. You may need this after age 77. Pneumococcal 13-valent conjugate (PCV13) vaccine. One dose is recommended after age 63. Pneumococcal polysaccharide (PPSV23) vaccine. One dose is recommended after age 40. Talk to your health care provider about which screenings and vaccines you need and how often you need them. This information is not intended to replace advice given to you by your health care provider. Make sure you discuss any questions you have with your health care provider. Document Released: 04/23/2015 Document Revised: 12/15/2015 Document Reviewed: 01/26/2015 Elsevier Interactive Patient Education  2017 [place] Prevention in the Home Falls can cause injuries. They can happen to people of all ages. There are many things you can do to make your home safe and to help prevent falls. What can I do on the outside of my home? Regularly fix the edges of walkways and driveways and fix any cracks. Remove anything that might make you trip as you walk through a door, such as a raised step or threshold. Trim any bushes or trees on the path to your home. Use bright outdoor lighting. Clear any walking paths of anything that might make someone trip, such as rocks or tools. Regularly check to see if handrails are loose or broken. Make sure that both sides of any steps have handrails. Any raised decks and porches should have guardrails on the edges. Have any leaves, snow, or ice cleared regularly. Use sand or salt on walking paths during winter. Clean up any spills in your garage right away. This includes oil or grease spills. What can I do in the bathroom? Use night lights. Install grab bars by the toilet and in the tub and shower. Do not use towel bars as grab bars. Use non-skid mats or decals in the tub or shower. If you need to sit down in the shower, use a plastic, non-slip stool. Keep the floor dry. Clean up any water that spills on the floor  as soon as it happens. Remove soap buildup in the tub or shower regularly. Attach bath mats securely with double-sided non-slip rug tape. Do not have throw rugs and other things on the floor that can make you trip. What can I do in the bedroom? Use night lights. Make sure that you have a light by your bed that is easy to reach. Do not use any sheets or blankets that are too big for your bed. They should not hang down onto the floor. Have a firm chair that has side arms. You can use this for support while you get dressed. Do not have throw rugs and other things on the floor that can make you trip. What can I do in the kitchen? Clean up any spills right away. Avoid walking on wet floors. Keep items that you use a lot in easy-to-reach places. If you need to reach something above you, use a strong step stool that has a grab bar. Keep electrical cords out of the way. Do not use floor polish or wax that makes floors slippery. If you must use wax, use non-skid floor wax. Do not have throw rugs and other things on the floor that can make you trip. What can I do with my stairs? Do not leave any items on the stairs. Make sure that there are handrails on both sides of the stairs and use them. Fix handrails that are broken or loose. Make sure that handrails are as long  as the stairways. Check any carpeting to make sure that it is firmly attached to the stairs. Fix any carpet that is loose or worn. Avoid having throw rugs at the top or bottom of the stairs. If you do have throw rugs, attach them to the floor with carpet tape. Make sure that you have a light switch at the top of the stairs and the bottom of the stairs. If you do not have them, ask someone to add them for you. What else can I do to help prevent falls? Wear shoes that: Do not have high heels. Have rubber bottoms. Are comfortable and fit you well. Are closed at the toe. Do not wear sandals. If you use a stepladder: Make sure that it is  fully opened. Do not climb a closed stepladder. Make sure that both sides of the stepladder are locked into place. Ask someone to hold it for you, if possible. Clearly mark and make sure that you can see: Any grab bars or handrails. First and last steps. Where the edge of each step is. Use tools that help you move around (mobility aids) if they are needed. These include: Canes. Walkers. Scooters. Crutches. Turn on the lights when you go into a dark area. Replace any light bulbs as soon as they burn out. Set up your furniture so you have a clear path. Avoid moving your furniture around. If any of your floors are uneven, fix them. If there are any pets around you, be aware of where they are. Review your medicines with your doctor. Some medicines can make you feel dizzy. This can increase your chance of falling. Ask your doctor what other things that you can do to help prevent falls. This information is not intended to replace advice given to you by your health care provider. Make sure you discuss any questions you have with your health care provider. Document Released: 01/21/2009 Document Revised: 09/02/2015 Document Reviewed: 05/01/2014 Elsevier Interactive Patient Education  2017 [employer].

## 2021-05-11 ENCOUNTER — Other Ambulatory Visit: Payer: Self-pay | Admitting: Internal Medicine

## 2021-05-11 DIAGNOSIS — I10 Essential (primary) hypertension: Secondary | ICD-10-CM

## 2021-05-12 ENCOUNTER — Other Ambulatory Visit: Payer: Self-pay | Admitting: Internal Medicine

## 2021-05-12 DIAGNOSIS — I10 Essential (primary) hypertension: Secondary | ICD-10-CM

## 2021-05-12 DIAGNOSIS — N281 Cyst of kidney, acquired: Secondary | ICD-10-CM | POA: Diagnosis not present

## 2021-05-12 DIAGNOSIS — C678 Malignant neoplasm of overlapping sites of bladder: Secondary | ICD-10-CM | POA: Diagnosis not present

## 2021-05-12 DIAGNOSIS — C662 Malignant neoplasm of left ureter: Secondary | ICD-10-CM | POA: Diagnosis not present

## 2021-05-13 ENCOUNTER — Other Ambulatory Visit: Payer: Self-pay | Admitting: Internal Medicine

## 2021-05-13 ENCOUNTER — Encounter: Payer: Self-pay | Admitting: Dermatology

## 2021-05-13 DIAGNOSIS — I10 Essential (primary) hypertension: Secondary | ICD-10-CM

## 2021-05-13 MED ORDER — TRIAMTERENE-HCTZ 37.5-25 MG PO TABS: 1.0000 | ORAL_TABLET | Freq: Every day | ORAL | 1 refills | Status: DC

## 2021-05-13 NOTE — Progress Notes (Signed)
° °  Follow-Up Visit   Subjective  [name] is a 76 y.o. female who presents for the following: Annual Exam (Here for annual skin exam. No concerns. No history of skin cancers. ).  General skin examination Location:  Duration:  Quality:  Associated Signs/Symptoms: Modifying Factors:  Severity:  Timing: Context:   Objective  Well appearing patient in no apparent distress; mood and affect are within normal limits. Mid Back Full body skin exam: No atypical pigmented lesions or nonmelanoma skin cancer  Chest - Medial (Center), Head - Anterior (Face), Mid Back Several brown textured flattopped papules; typical dermoscopy  Neck - Anterior Fleshy, skin-colored 1 mm pedunculated papules.    Left Upper Eyelid Half millimeter upper dermal firm white papule  abdomen Smooth red 2 mm dermal papule    A full examination was performed including scalp, head, eyes, ears, nose, lips, neck, chest, axillae, abdomen, back, buttocks, bilateral upper extremities, bilateral lower extremities, hands, feet, fingers, toes, fingernails, and toenails. All findings within normal limits unless otherwise noted below.  Areas beneath undergarments not fully examined.   Assessment & Plan    Screening for malignant neoplasm of skin Mid Back  Annual skin examination, encouraged to self examine twice annually.  Seborrheic keratosis (3) Head - Anterior (Face); Chest - Medial [facility]); Mid Back  Check as needed change  Skin tag Neck - Anterior  No intervention necessary  [place] Left Upper Eyelid  No intervention  Hemangioma of skin abdomen  No intervention      I, Lavonna Monarch, MD, have reviewed all documentation for this visit.  The documentation on 05/13/21 for the exam, diagnosis, procedures, and orders are all accurate and complete.

## 2021-09-30 ENCOUNTER — Other Ambulatory Visit: Payer: Self-pay | Admitting: Internal Medicine

## 2021-09-30 DIAGNOSIS — E7800 Pure hypercholesterolemia, unspecified: Secondary | ICD-10-CM

## 2021-09-30 DIAGNOSIS — I10 Essential (primary) hypertension: Secondary | ICD-10-CM

## 2021-10-05 ENCOUNTER — Other Ambulatory Visit: Payer: Self-pay | Admitting: Internal Medicine

## 2021-10-05 ENCOUNTER — Telehealth: Payer: Self-pay | Admitting: Internal Medicine

## 2021-10-05 DIAGNOSIS — E7800 Pure hypercholesterolemia, unspecified: Secondary | ICD-10-CM

## 2021-10-05 DIAGNOSIS — I10 Essential (primary) hypertension: Secondary | ICD-10-CM

## 2021-10-05 MED ORDER — TRIAMTERENE-HCTZ 37.5-25 MG PO TABS: 1.0000 | ORAL_TABLET | Freq: Every day | ORAL | 0 refills | Status: DC

## 2021-10-05 MED ORDER — ATORVASTATIN CALCIUM 40 MG PO TABS: 40.0000 mg | ORAL_TABLET | Freq: Every day | ORAL | 0 refills | Status: DC

## 2021-10-05 NOTE — Telephone Encounter (Signed)
Caller & Relationship to patient: [name]  Call back number: [phone]  Date of last office visit: 04/07/21  Date of next office visit: 10/27/21  Medication(s) to be refilled:  atorvastatin (LIPITOR) 40 MG tablet triamterene-hydrochlorothiazide (MAXZIDE-25) 37.5-25 MG tablet      Preferred Pharmacy:  [id number] [place] [place]:  [phone] Fax:  [phone]

## 2021-10-27 ENCOUNTER — Ambulatory Visit (INDEPENDENT_AMBULATORY_CARE_PROVIDER_SITE_OTHER): Payer: Medicare Other | Admitting: Internal Medicine

## 2021-10-27 ENCOUNTER — Encounter: Payer: Self-pay | Admitting: Internal Medicine

## 2021-10-27 VITALS — BP 138/82 | HR 78 | Temp 98.1°F | Resp 16 | Ht 71.0 in | Wt 214.0 lb

## 2021-10-27 DIAGNOSIS — Z0000 Encounter for general adult medical examination without abnormal findings: Secondary | ICD-10-CM

## 2021-10-27 DIAGNOSIS — E7800 Pure hypercholesterolemia, unspecified: Secondary | ICD-10-CM

## 2021-10-27 DIAGNOSIS — I10 Essential (primary) hypertension: Secondary | ICD-10-CM

## 2021-10-27 DIAGNOSIS — N1831 Chronic kidney disease, stage 3a: Secondary | ICD-10-CM | POA: Diagnosis not present

## 2021-10-27 DIAGNOSIS — R7303 Prediabetes: Secondary | ICD-10-CM

## 2021-10-27 LAB — CBC WITH DIFFERENTIAL/PLATELET
Basophils Absolute: 0.1 10*3/uL (ref 0.0–0.1)
Basophils Relative: 0.8 % (ref 0.0–3.0)
Eosinophils Absolute: 0.2 10*3/uL (ref 0.0–0.7)
Eosinophils Relative: 2.1 % (ref 0.0–5.0)
HCT: 39.7 % (ref 36.0–46.0)
Hemoglobin: 13.5 g/dL (ref 12.0–15.0)
Lymphocytes Relative: 27.8 % (ref 12.0–46.0)
Lymphs Abs: 2 10*3/uL (ref 0.7–4.0)
MCHC: 34.1 g/dL (ref 30.0–36.0)
MCV: 92.7 fl (ref 78.0–100.0)
Monocytes Absolute: 0.6 10*3/uL (ref 0.1–1.0)
Monocytes Relative: 8.4 % (ref 3.0–12.0)
Neutro Abs: 4.5 10*3/uL (ref 1.4–7.7)
Neutrophils Relative %: 60.9 % (ref 43.0–77.0)
Platelets: 329 10*3/uL (ref 150.0–400.0)
RBC: 4.28 Mil/uL (ref 3.87–5.11)
RDW: 13.1 % (ref 11.5–15.5)
WBC: 7.3 10*3/uL (ref 4.0–10.5)

## 2021-10-27 LAB — LIPID PANEL
Cholesterol: 177 mg/dL (ref 0–200)
HDL: 53.9 mg/dL (ref >39.00)
NonHDL: 122.61
Total CHOL/HDL Ratio: 3
Triglycerides: 225 mg/dL — ABNORMAL HIGH (ref 0.0–149.0)
VLDL: 45 mg/dL — ABNORMAL HIGH (ref 0.0–40.0)

## 2021-10-27 LAB — BASIC METABOLIC PANEL
BUN: 16 mg/dL (ref 6–23)
CO2: 27 mEq/L (ref 19–32)
Calcium: 9.9 mg/dL (ref 8.4–10.5)
Chloride: 101 mEq/L (ref 96–112)
Creatinine, Ser: 1.08 mg/dL (ref 0.40–1.20)
GFR: 50.76 mL/min — ABNORMAL LOW (ref >60.00)
Glucose, Bld: 91 mg/dL (ref 70–99)
Potassium: 4 mEq/L (ref 3.5–5.1)
Sodium: 139 mEq/L (ref 135–145)

## 2021-10-27 LAB — HEPATIC FUNCTION PANEL
ALT: 25 U/L (ref 0–35)
AST: 24 U/L (ref 0–37)
Albumin: 4.8 g/dL (ref 3.5–5.2)
Alkaline Phosphatase: 69 U/L (ref 39–117)
Bilirubin, Direct: 0.1 mg/dL (ref 0.0–0.3)
Total Bilirubin: 0.5 mg/dL (ref 0.2–1.2)
Total Protein: 7.4 g/dL (ref 6.0–8.3)

## 2021-10-27 LAB — TSH: TSH: 1.82 u[IU]/mL (ref 0.35–5.50)

## 2021-10-27 LAB — LDL CHOLESTEROL, DIRECT: Direct LDL: 93 mg/dL

## 2021-10-27 LAB — HEMOGLOBIN A1C: Hgb A1c MFr Bld: 6.1 % (ref 4.6–6.5)

## 2021-10-27 NOTE — Patient Instructions (Signed)

## 2021-10-27 NOTE — Progress Notes (Signed)
Subjective:  Patient ID: [name], female    DOB: [birth year]  Age: 76 y.o. MRN: [id number]  CC: Annual Exam, Hypertension, and Hyperlipidemia   HPI ALLA SLOMA presents for a CPX.  She swims about an hour a day and walks about an hour a day.  She does not experience chest pain, shortness of breath, dizziness, lightheadedness, diaphoresis, or edema.  Outpatient Medications Prior to Visit  Medication Sig Dispense Refill   amLODipine (NORVASC) 10 MG tablet Take 1 tablet (10 mg total) by mouth daily. 90 tablet 1   atorvastatin (LIPITOR) 40 MG tablet Take 1 tablet (40 mg total) by mouth daily. 90 tablet 0   triamterene-hydrochlorothiazide (MAXZIDE-25) 37.5-25 MG tablet Take 1 tablet by mouth daily. 90 tablet 0   No facility-administered medications prior to visit.    ROS Review of Systems  Constitutional: Negative.  Negative for diaphoresis and fatigue.  HENT: Negative.    Eyes: Negative.   Respiratory:  Negative for cough, chest tightness, shortness of breath and wheezing.   Cardiovascular:  Negative for chest pain, palpitations and leg swelling.  Gastrointestinal:  Negative for abdominal pain, constipation, diarrhea, nausea and vomiting.  Endocrine: Negative.   Genitourinary: Negative.  Negative for difficulty urinating.  Musculoskeletal: Negative.   Skin: Negative.  Negative for color change and pallor.  Neurological:  Negative for dizziness, weakness and light-headedness.  Hematological:  Negative for adenopathy. Does not bruise/bleed easily.  Psychiatric/Behavioral: Negative.      Objective:  BP 138/82 (BP Location: Left Arm, Patient Position: Sitting, Cuff Size: Large)   Pulse 78   Temp 98.1 F (36.7 C) (Oral)   Resp 16   Ht '5\' 11"'$  (1.803 m)   Wt 214 lb (97.1 kg)   SpO2 95%   BMI 29.85 kg/m   BP Readings from Last 3 Encounters:  10/27/21 138/82  04/21/21 120/68  04/07/21 134/82    Wt Readings from Last 3 Encounters:  10/27/21 214 lb (97.1  kg)  04/21/21 221 lb 3.2 oz (100.3 kg)  04/07/21 221 lb (100.2 kg)    Physical Exam Vitals reviewed.  HENT:     Nose: Nose normal.     Mouth/Throat:     Mouth: Mucous membranes are moist.  Eyes:     General: No scleral icterus.    Conjunctiva/sclera: Conjunctivae normal.  Cardiovascular:     Rate and Rhythm: Normal rate and regular rhythm.     Heart sounds: No murmur heard. Pulmonary:     Effort: Pulmonary effort is normal.     Breath sounds: No stridor. No wheezing, rhonchi or rales.  Abdominal:     General: Abdomen is flat.     Palpations: There is no mass.     Tenderness: There is no abdominal tenderness. There is no guarding.     Hernia: No hernia is present.  Musculoskeletal:        General: Normal range of motion.     Cervical back: Neck supple.     Right lower leg: No edema.     Left lower leg: No edema.  Lymphadenopathy:     Cervical: No cervical adenopathy.  Skin:    General: Skin is warm and dry.  Neurological:     General: No focal deficit present.  Psychiatric:        Mood and Affect: Mood normal.        Behavior: Behavior normal.     Lab Results  Component Value Date   WBC 7.3 10/27/2021  HGB 13.5 10/27/2021   HCT 39.7 10/27/2021   PLT 329.0 10/27/2021   GLUCOSE 91 10/27/2021   CHOL 177 10/27/2021   TRIG 225.0 (H) 10/27/2021   HDL 53.90 10/27/2021   LDLDIRECT 93.0 10/27/2021   LDLCALC 90 04/20/2020   ALT 25 10/27/2021   AST 24 10/27/2021   NA 139 10/27/2021   K 4.0 10/27/2021   CL 101 10/27/2021   CREATININE 1.08 10/27/2021   BUN 16 10/27/2021   CO2 27 10/27/2021   TSH 1.82 10/27/2021   INR 1.4 05/05/2008   HGBA1C 6.1 10/27/2021    No results found.  Assessment & Plan:   Juana was seen today for annual exam, hypertension and hyperlipidemia.  Diagnoses and all orders for this visit:  Essential hypertension, benign- Her blood pressure is adequately well controlled.  Electrolytes and renal function are normal. -     Basic  metabolic panel; Future -     CBC with Differential/Platelet; Future -     TSH; Future -     Hepatic function panel; Future -     Hepatic function panel -     TSH -     CBC with Differential/Platelet -     Basic metabolic panel  Routine general medical examination at a health care facility- Exam completed, labs reviewed, vaccines are up-to-date, cancer screenings are up-to-date, patient education was given.  Prediabetes- Her A1c is down to 6.1%. -     Basic metabolic panel; Future -     Hemoglobin A1c; Future -     Hemoglobin A1c -     Basic metabolic panel  Pure hypercholesterolemia- LDL goal achieved. Doing well on the statin  -     Lipid panel; Future -     TSH; Future -     Hepatic function panel; Future -     Hepatic function panel -     TSH -     Lipid panel  Stage 3a chronic kidney disease ([place])- Her renal function is stable. -     Basic metabolic panel; Future -     Cancel: Urinalysis, Routine w reflex microscopic; Future -     Basic metabolic panel  Other orders -     LDL cholesterol, direct   I am having [name] "[name]" maintain her amLODipine, atorvastatin, and triamterene-hydrochlorothiazide.  No orders of the defined types were placed in this encounter.    Follow-up: Return in about 6 months (around 04/29/2022).  Scarlette Calico, MD

## 2021-10-31 DIAGNOSIS — C678 Malignant neoplasm of overlapping sites of bladder: Secondary | ICD-10-CM | POA: Diagnosis not present

## 2021-11-07 DIAGNOSIS — N281 Cyst of kidney, acquired: Secondary | ICD-10-CM | POA: Diagnosis not present

## 2021-11-07 DIAGNOSIS — C679 Malignant neoplasm of bladder, unspecified: Secondary | ICD-10-CM | POA: Diagnosis not present

## 2021-11-07 DIAGNOSIS — C801 Malignant (primary) neoplasm, unspecified: Secondary | ICD-10-CM | POA: Diagnosis not present

## 2021-11-07 DIAGNOSIS — C678 Malignant neoplasm of overlapping sites of bladder: Secondary | ICD-10-CM | POA: Diagnosis not present

## 2021-11-10 ENCOUNTER — Encounter: Payer: Self-pay | Admitting: Internal Medicine

## 2021-11-11 ENCOUNTER — Other Ambulatory Visit: Payer: Self-pay | Admitting: Internal Medicine

## 2021-11-11 DIAGNOSIS — R9389 Abnormal findings on diagnostic imaging of other specified body structures: Secondary | ICD-10-CM

## 2021-11-12 DIAGNOSIS — R059 Cough, unspecified: Secondary | ICD-10-CM | POA: Diagnosis not present

## 2021-11-12 DIAGNOSIS — Z20822 Contact with and (suspected) exposure to covid-19: Secondary | ICD-10-CM | POA: Diagnosis not present

## 2021-11-12 DIAGNOSIS — J029 Acute pharyngitis, unspecified: Secondary | ICD-10-CM | POA: Diagnosis not present

## 2021-11-12 DIAGNOSIS — R509 Fever, unspecified: Secondary | ICD-10-CM | POA: Diagnosis not present

## 2021-11-21 DIAGNOSIS — Z1231 Encounter for screening mammogram for malignant neoplasm of breast: Secondary | ICD-10-CM | POA: Diagnosis not present

## 2021-12-25 ENCOUNTER — Other Ambulatory Visit: Payer: Self-pay | Admitting: Internal Medicine

## 2021-12-25 DIAGNOSIS — I10 Essential (primary) hypertension: Secondary | ICD-10-CM

## 2022-01-01 ENCOUNTER — Other Ambulatory Visit: Payer: Self-pay | Admitting: Internal Medicine

## 2022-01-01 DIAGNOSIS — E7800 Pure hypercholesterolemia, unspecified: Secondary | ICD-10-CM

## 2022-01-10 DIAGNOSIS — R9389 Abnormal findings on diagnostic imaging of other specified body structures: Secondary | ICD-10-CM | POA: Diagnosis not present

## 2022-01-24 DIAGNOSIS — C662 Malignant neoplasm of left ureter: Secondary | ICD-10-CM | POA: Diagnosis not present

## 2022-01-24 DIAGNOSIS — C678 Malignant neoplasm of overlapping sites of bladder: Secondary | ICD-10-CM | POA: Diagnosis not present

## 2022-02-01 ENCOUNTER — Other Ambulatory Visit: Payer: Self-pay | Admitting: Internal Medicine

## 2022-02-01 DIAGNOSIS — I10 Essential (primary) hypertension: Secondary | ICD-10-CM

## 2022-02-07 ENCOUNTER — Telehealth: Payer: Self-pay | Admitting: *Deleted

## 2022-02-07 ENCOUNTER — Encounter: Payer: Self-pay | Admitting: *Deleted

## 2022-02-07 NOTE — Patient Outreach (Signed)
  Care Coordination   Initial Visit Note   02/07/2022 Name: REDELL BHANDARI MRN: [id number] DOB: Jun 11, 1945  BIJAL SIGLIN is a 76 y.o. year old female who sees Janith Lima, MD for primary care. I spoke with  Julian Hy by phone today.  What matters to the patients health and wellness today?  No needs    Goals Addressed               This Visit's Progress     COMPLETED: No needs (pt-stated)        Care Coordination Interventions: Reviewed medications with patient and discussed adherence with all medications Reviewed scheduled/upcoming provider appointments including pending appointments Assessed social determinant of health barriers          SDOH assessments and interventions completed:  Yes  SDOH Interventions Today    Flowsheet Row Most Recent Value  SDOH Interventions   Food Insecurity Interventions Intervention Not Indicated  Housing Interventions Intervention Not Indicated  Transportation Interventions Intervention Not Indicated  Utilities Interventions Intervention Not Indicated        Care Coordination Interventions Activated:  Yes  Care Coordination Interventions:  Yes, provided   Follow up plan: No further intervention required.   Encounter Outcome:  Pt. Visit Completed   Raina Mina, RN Care Management Coordinator [place] Office [phone]

## 2022-02-07 NOTE — Patient Instructions (Signed)
Visit Information  Thank you for taking time to visit with me today. Please don't hesitate to contact me if I can be of assistance to you.   Following are the goals we discussed today:   Goals Addressed               This Visit's Progress     COMPLETED: No needs (pt-stated)        Care Coordination Interventions: Reviewed medications with patient and discussed adherence with all medications Reviewed scheduled/upcoming provider appointments including pending appointments Assessed social determinant of health barriers          Please call the care guide team at [phone] if you need to cancel or reschedule your appointment.   If you are experiencing a Mental Health or [place] or need someone to talk to, please call the Suicide and Crisis Lifeline: 988  Patient verbalizes understanding of instructions and care plan provided today and agrees to view in [place]. Active MyChart status and patient understanding of how to access instructions and care plan via MyChart confirmed with patient.     No follow up needs to address today.  Raina Mina, RN Care Management Coordinator [place] Office [phone]

## 2022-03-31 ENCOUNTER — Other Ambulatory Visit: Payer: Self-pay | Admitting: Internal Medicine

## 2022-03-31 DIAGNOSIS — E7800 Pure hypercholesterolemia, unspecified: Secondary | ICD-10-CM

## 2022-04-06 ENCOUNTER — Telehealth: Payer: Self-pay | Admitting: Internal Medicine

## 2022-04-06 NOTE — Telephone Encounter (Signed)
Left message for patient to call back to schedule Medicare Annual Wellness Visit   Last AWV  04/21/21  Please schedule at anytime with [facility] if patient calls the office back.      Any questions, please call me at [phone]

## 2022-04-29 ENCOUNTER — Other Ambulatory Visit: Payer: Self-pay | Admitting: Internal Medicine

## 2022-04-29 DIAGNOSIS — I10 Essential (primary) hypertension: Secondary | ICD-10-CM

## 2022-05-04 ENCOUNTER — Ambulatory Visit (INDEPENDENT_AMBULATORY_CARE_PROVIDER_SITE_OTHER): Payer: Medicare Other

## 2022-05-04 VITALS — BP 118/60 | HR 76 | Temp 98.5°F | Resp 16 | Ht 71.0 in | Wt 218.6 lb

## 2022-05-04 DIAGNOSIS — Z0000 Encounter for general adult medical examination without abnormal findings: Secondary | ICD-10-CM

## 2022-05-04 NOTE — Patient Instructions (Addendum)
[name] , Thank you for taking time to come for your Medicare Wellness Visit. I appreciate your ongoing commitment to your health goals. Please review the following plan we discussed and let me know if I can assist you in the future.   These are the goals we discussed:  Goals      My healthcare goal for 2024 is to maintain my current health status by continuing to eat healthy, stay physically active and socially active.        This is a list of the screening recommended for you and due dates:  Health Maintenance  Topic Date Due   Zoster (Shingles) Vaccine (2 of 2) 11/16/2020   COVID-19 Vaccine (7 - 2023-24 season) 04/03/2022   Mammogram  11/16/2022   Medicare Annual Wellness Visit  05/05/2023   Colon Cancer Screening  07/22/2029   DTaP/Tdap/Td vaccine (2 - Td or Tdap) 09/10/2030   Pneumonia Vaccine  Completed   Flu Shot  Completed   DEXA scan (bone density measurement)  Completed   Hepatitis C Screening: USPSTF Recommendation to screen - Ages 45-79 yo.  Completed   HPV Vaccine  Aged Out    Advanced directives: Yes  Conditions/risks identified: Yes  Next appointment: Follow up in one year for your annual wellness visit.   Preventive Care 75 Years and Older, Female Preventive care refers to lifestyle choices and visits with your health care provider that can promote health and wellness. What does preventive care include? A yearly physical exam. This is also called an annual well check. Dental exams once or twice a year. Routine eye exams. Ask your health care provider how often you should have your eyes checked. Personal lifestyle choices, including: Daily care of your teeth and gums. Regular physical activity. Eating a healthy diet. Avoiding tobacco and drug use. Limiting alcohol use. Practicing safe sex. Taking low-dose aspirin every day. Taking vitamin and mineral supplements as recommended by your health care provider. What happens during an annual well  check? The services and screenings done by your health care provider during your annual well check will depend on your age, overall health, lifestyle risk factors, and family history of disease. Counseling  Your health care provider may ask you questions about your: Alcohol use. Tobacco use. Drug use. Emotional well-being. Home and relationship well-being. Sexual activity. Eating habits. History of falls. Memory and ability to understand (cognition). Work and work Statistician. Reproductive health. Screening  You may have the following tests or measurements: Height, weight, and BMI. Blood pressure. Lipid and cholesterol levels. These may be checked every 5 years, or more frequently if you are over 43 years old. Skin check. Lung cancer screening. You may have this screening every year starting at age 36 if you have a 30-pack-year history of smoking and currently smoke or have quit within the past 15 years. Fecal occult blood test (FOBT) of the stool. You may have this test every year starting at age 64. Flexible sigmoidoscopy or colonoscopy. You may have a sigmoidoscopy every 5 years or a colonoscopy every 10 years starting at age 53. Hepatitis C blood test. Hepatitis B blood test. Sexually transmitted disease (STD) testing. Diabetes screening. This is done by checking your blood sugar (glucose) after you have not eaten for a while (fasting). You may have this done every 1-3 years. Bone density scan. This is done to screen for osteoporosis. You may have this done starting at age 51. Mammogram. This may be done every 1-2 years. Talk to  your health care provider about how often you should have regular mammograms. Talk with your health care provider about your test results, treatment options, and if necessary, the need for more tests. Vaccines  Your health care provider may recommend certain vaccines, such as: Influenza vaccine. This is recommended every year. Tetanus, diphtheria, and  acellular pertussis (Tdap, Td) vaccine. You may need a Td booster every 10 years. Zoster vaccine. You may need this after age 75. Pneumococcal 13-valent conjugate (PCV13) vaccine. One dose is recommended after age 45. Pneumococcal polysaccharide (PPSV23) vaccine. One dose is recommended after age 62. Talk to your health care provider about which screenings and vaccines you need and how often you need them. This information is not intended to replace advice given to you by your health care provider. Make sure you discuss any questions you have with your health care provider. Document Released: 04/23/2015 Document Revised: 12/15/2015 Document Reviewed: 01/26/2015 Elsevier Interactive Patient Education  2017 [place] Prevention in the Home Falls can cause injuries. They can happen to people of all ages. There are many things you can do to make your home safe and to help prevent falls. What can I do on the outside of my home? Regularly fix the edges of walkways and driveways and fix any cracks. Remove anything that might make you trip as you walk through a door, such as a raised step or threshold. Trim any bushes or trees on the path to your home. Use bright outdoor lighting. Clear any walking paths of anything that might make someone trip, such as rocks or tools. Regularly check to see if handrails are loose or broken. Make sure that both sides of any steps have handrails. Any raised decks and porches should have guardrails on the edges. Have any leaves, snow, or ice cleared regularly. Use sand or salt on walking paths during winter. Clean up any spills in your garage right away. This includes oil or grease spills. What can I do in the bathroom? Use night lights. Install grab bars by the toilet and in the tub and shower. Do not use towel bars as grab bars. Use non-skid mats or decals in the tub or shower. If you need to sit down in the shower, use a plastic, non-slip stool. Keep  the floor dry. Clean up any water that spills on the floor as soon as it happens. Remove soap buildup in the tub or shower regularly. Attach bath mats securely with double-sided non-slip rug tape. Do not have throw rugs and other things on the floor that can make you trip. What can I do in the bedroom? Use night lights. Make sure that you have a light by your bed that is easy to reach. Do not use any sheets or blankets that are too big for your bed. They should not hang down onto the floor. Have a firm chair that has side arms. You can use this for support while you get dressed. Do not have throw rugs and other things on the floor that can make you trip. What can I do in the kitchen? Clean up any spills right away. Avoid walking on wet floors. Keep items that you use a lot in easy-to-reach places. If you need to reach something above you, use a strong step stool that has a grab bar. Keep electrical cords out of the way. Do not use floor polish or wax that makes floors slippery. If you must use wax, use non-skid floor wax. Do not  have throw rugs and other things on the floor that can make you trip. What can I do with my stairs? Do not leave any items on the stairs. Make sure that there are handrails on both sides of the stairs and use them. Fix handrails that are broken or loose. Make sure that handrails are as long as the stairways. Check any carpeting to make sure that it is firmly attached to the stairs. Fix any carpet that is loose or worn. Avoid having throw rugs at the top or bottom of the stairs. If you do have throw rugs, attach them to the floor with carpet tape. Make sure that you have a light switch at the top of the stairs and the bottom of the stairs. If you do not have them, ask someone to add them for you. What else can I do to help prevent falls? Wear shoes that: Do not have high heels. Have rubber bottoms. Are comfortable and fit you well. Are closed at the toe. Do not  wear sandals. If you use a stepladder: Make sure that it is fully opened. Do not climb a closed stepladder. Make sure that both sides of the stepladder are locked into place. Ask someone to hold it for you, if possible. Clearly mark and make sure that you can see: Any grab bars or handrails. First and last steps. Where the edge of each step is. Use tools that help you move around (mobility aids) if they are needed. These include: Canes. Walkers. Scooters. Crutches. Turn on the lights when you go into a dark area. Replace any light bulbs as soon as they burn out. Set up your furniture so you have a clear path. Avoid moving your furniture around. If any of your floors are uneven, fix them. If there are any pets around you, be aware of where they are. Review your medicines with your doctor. Some medicines can make you feel dizzy. This can increase your chance of falling. Ask your doctor what other things that you can do to help prevent falls. This information is not intended to replace advice given to you by your health care provider. Make sure you discuss any questions you have with your health care provider. Document Released: 01/21/2009 Document Revised: 09/02/2015 Document Reviewed: 05/01/2014 Elsevier Interactive Patient Education  2017 [employer].

## 2022-05-04 NOTE — Progress Notes (Signed)
Subjective:   [name] is a 77 y.o. female who presents for Medicare Annual (Subsequent) preventive examination.  Review of Systems     Cardiac Risk Factors include: advanced age (>96mn, >>67women);family history of premature cardiovascular disease;dyslipidemia;hypertension     Objective:    Today's Vitals   05/04/22 1104  Height: '5\' 11"'$  (1.803 m)   Body mass index is 29.85 kg/m.     04/21/2021    9:56 AM 01/21/2021   11:42 AM 04/12/2020    1:06 PM 10/03/2019    4:18 PM 10/03/2019    9:30 AM 09/19/2019   11:22 AM 06/13/2019   12:19 PM  Advanced Directives  Does Patient Have a Medical Advance Directive? Yes Yes Yes Yes Yes Yes Yes  Type of Advance Directive Living will;Healthcare Power of [place]   Does patient want to make changes to medical advance directive? No - Patient declined  No - Patient declined No - Patient declined   No - Patient declined  Copy of HPort [place] Chart? No - copy requested  No - copy requested No - copy requested       Current Medications (verified) Outpatient Encounter Medications as of 05/04/2022  Medication Sig   amLODipine (NORVASC) 10 MG tablet TAKE 1 TABLET BY MOUTH EVERY DAY   atorvastatin (LIPITOR) 40 MG tablet TAKE 1 TABLET BY MOUTH EVERY DAY   triamterene-hydrochlorothiazide (MAXZIDE-25) 37.5-25 MG tablet TAKE 1 TABLET BY MOUTH EVERY DAY   No facility-administered encounter medications on file as of 05/04/2022.    Allergies (verified) Hycodan [hydrocodone bit-homatrop mbr]   History: Past Medical History:  Diagnosis Date   Bladder cancer ([place]UR[place]    [facility]  Duplex kidney    Duplex kidney    RIGHT   History of adenomatous polyp of colon    History of recurrent UTIs    Hyperlipidemia    Hypertension    Wears contact lenses     Past Surgical History:  Procedure Laterality Date   BENIGN BREAST BX Right 1995   BUNIONECTOMY Right 2000   bunionectomy left foot  04/2016   COLONOSCOPY W/ POLYPECTOMY  01/06/2013   single sessile polyp ascending colon.  [facility].   CYSTOSCOPY W/ RETROGRADES Bilateral 12/10/2013   Procedure: RETROGRADE PYELOGRAM  ;  Surgeon: TAlexis Frock MD;  [facility]  Service: Urology;  Laterality: Bilateral;   CYSTOSCOPY W/ RETROGRADES Bilateral 07/15/2014   Procedure: CYSTOSCOPY WITH RETROGRADE PYELOGRAM AND INSTILLATION OF MITOMYCIN C;  Surgeon: TAlexis Frock MD;  [facility]  Service: Urology;  Laterality: Bilateral;   CYSTOSCOPY W/ RETROGRADES Bilateral 12/31/2015   Procedure: CYSTOSCOPY WITH RETROGRADE PYELOGRAM;  Surgeon: TAlexis Frock MD;  [facility]  Service: Urology;  Laterality: Bilateral;   CYSTOSCOPY W/ RETROGRADES Bilateral 11/17/2016   Procedure: CYSTOSCOPY WITH RETROGRADE PYELOGRAM;  Surgeon: MAlexis Frock MD;  [facility]  Service: Urology;  Laterality: Bilateral;   CYSTOSCOPY W/ RETROGRADES Bilateral 06/13/2019   Procedure: CYSTOSCOPY WITH RETROGRADE PYELOGRAM;  Surgeon: MAlexis Frock MD;  [facility]  Service: Urology;  Laterality: Bilateral;   CYSTOSCOPY W/ RETROGRADES Bilateral 01/21/2021   Procedure: CYSTOSCOPY WITH RETROGRADE PYELOGRAM;  Surgeon: MAlexis Frock MD;  [facility]  Service: Urology;  Laterality: Bilateral;   CYSTOSCOPY W/  URETERAL STENT PLACEMENT Bilateral 10/22/2013   Procedure: CYSTOSCOPY WITH RETROGRADE PYELOGRAM;  Surgeon: Alexis Frock, MD;  [facility];  Service: Urology;  Laterality: Bilateral;   CYSTOSCOPY W/ URETERAL STENT PLACEMENT Left 10/03/2019   Procedure: CYSTOSCOPY WITH STENT REPLACEMENT;  Surgeon: Alexis Frock, MD;  [facility];  Service: Urology;   Laterality: Left;   CYSTOSCOPY WITH BIOPSY N/A 01/21/2021   Procedure: CYSTOSCOPY WITH BIOPSY;  Surgeon: Alexis Frock, MD;  Location[facility];  Service: Urology;  Laterality: N/A;   CYSTOSCOPY WITH FULGERATION N/A 01/21/2021   Procedure: CYSTOSCOPY WITH FULGERATION;  Surgeon: Alexis Frock, MD;  [facility]  Service: Urology;  Laterality: N/A;   CYSTOSCOPY WITH RETROGRADE PYELOGRAM, URETEROSCOPY AND STENT PLACEMENT Bilateral 04/14/2015   Procedure: CYSTOSCOPY WITH RETROGRADE PYELOGRAM, URETEROSCOPY AND RIGHT STENT PLACEMENT;  Surgeon: Alexis Frock, MD;  Location: [facility];  Service: Urology;  Laterality: Bilateral;   TONSILLECTOMY  32   (age 4)   TRANSURETHRAL RESECTION OF BLADDER TUMOR N/A 12/31/2015   Procedure: TRANSURETHRAL RESECTION OF BLADDER TUMOR (TURBT) WITH GYRUS;  Surgeon: Alexis Frock, MD;  Location: [facility];  Service: Urology;  Laterality: N/A;   TRANSURETHRAL RESECTION OF BLADDER TUMOR N/A 11/17/2016   Procedure: TRANSURETHRAL RESECTION OF BLADDER TUMOR (TURBT);  Surgeon: Alexis Frock, MD;  Location: [facility];  Service: Urology;  Laterality: N/A;   TRANSURETHRAL RESECTION OF BLADDER TUMOR N/A 06/13/2019   Procedure: TRANSURETHRAL RESECTION OF BLADDER TUMOR (TURBT). LEFT DIAGNOSTIC URETEROSCOPY AND LEFT STENT PLACEMENT;  Surgeon: Alexis Frock, MD;  Location: [facility];  Service: Urology;  Laterality: N/A;  45 MINS   TRANSURETHRAL RESECTION OF BLADDER TUMOR WITH GYRUS (TURBT-GYRUS) N/A 10/22/2013   Procedure: TRANSURETHRAL RESECTION OF BLADDER TUMOR WITH ERBE.;  Surgeon: Alexis Frock, MD;  Location[facility];  Service: Urology;  Laterality: N/A;   TRANSURETHRAL RESECTION OF BLADDER TUMOR WITH GYRUS (TURBT-GYRUS) N/A 12/10/2013   Procedure: RESTAGING TRANSURETHRAL RESECTION OF BLADDER TUMOR WITH GYRUS (TURBT-GYRUS);  Surgeon: Alexis Frock, MD;   Location[facility];  Service: Urology;  Laterality: N/A;   TRANSURETHRAL RESECTION OF BLADDER TUMOR WITH GYRUS (TURBT-GYRUS) N/A 07/15/2014   Procedure: TRANSURETHRAL RESECTION OF BLADDER TUMOR WITH GYRUS (TURBT-GYRUS);  Surgeon: Alexis Frock, MD;  Location: [facility];  Service: Urology;  Laterality: N/A;   TRANSURETHRAL RESECTION OF BLADDER TUMOR WITH GYRUS (TURBT-GYRUS) N/A 04/14/2015   Procedure: TRANSURETHRAL RESECTION OF BLADDER TUMOR WITH GYRUS (TURBT-GYRUS);  Surgeon: Alexis Frock, MD;  [facility];  Service: Urology;  Laterality: N/A;   Family History  Problem Relation Age of Onset   Hypertension Mother    Arthritis Mother        knee replacement; shoulder replacement   Heart disease Mother        AAA; mild AMI in 2018   Stroke Mother 63       TIA   Cancer Father        prostate cancer   Cancer Brother        prostate cancer   Cancer Brother 37       Melanoma   Mental illness Sister 99       bipolar disorder   Social History   Socioeconomic History   Marital status: Divorced    Spouse name: Not on file   Number of children: 3   Years of education: Not on file   Highest education level:  Not on file  Occupational History   Occupation: retired    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  Tobacco Use   Smoking status: Former    Packs/day: 0.15    Years: 20.00    Total pack years: 3.00    Types: Cigarettes    Quit date: 10/17/2012    Years since quitting: 9.5   Smokeless tobacco: Never  Vaping Use   Vaping Use: Never used  Substance and Sexual Activity   Alcohol use: Yes    Comment: OCCASIONAL   Drug use: No   Sexual activity: Not Currently    Birth control/protection: Post-menopausal  Other Topics Concern   Not on file  Social History Narrative   Marital status:  Divorced; not dating in 2018.      Children:  3 children; 2 grandchildren (5,4); family within walking  distance.        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      Tobacco:  None; quit in 09/2012.      Alcohol: 1 drink per week.       Drugs:  None      Exercise:  One hour of water aerobics 3-4 days per week.      Seatbelt: 100%      Guns:  none      ADLs: independent with all ADLs; drives; no assistant device.      Advanced Directives:  Yes; FULL CODE; no prolonged measures.   Social Determinants of Health   Financial Resource Strain: Low Risk  (05/04/2022)   Overall Financial Resource Strain (CARDIA)    Difficulty of Paying Living Expenses: Not hard at all  Food Insecurity: No Food Insecurity (05/04/2022)   Hunger Vital Sign    Worried About Running Out of Food in the Last Year: Never true    Ran Out of Food in the Last Year: Never true  Transportation Needs: No Transportation Needs (05/04/2022)   PRAPARE - Hydrologist (Medical): No    Lack of Transportation (Non-Medical): No  Physical Activity: Sufficiently Active (05/04/2022)   Exercise Vital Sign    Days of Exercise per Week: 5 days    Minutes of Exercise per Session: 60 min  Stress: No Stress Concern Present (05/04/2022)   [place]    Feeling of Stress : Not at all  Social Connections: Moderately Integrated (05/04/2022)   Social Connection and Isolation Panel [NHANES]    Frequency of Communication with Friends and Family: More than three times a week    Frequency of Social Gatherings with Friends and Family: More than three times a week    Attends Religious Services: 1 to 4 times per year    Active Member of [employer] or Organizations: Yes    Attends Music therapist: More than 4 times per year    Marital Status: Divorced    Tobacco Counseling Counseling given: Not Answered   Clinical Intake:  Pre-visit preparation completed: Yes  Pain : No/denies pain      Nutritional Risks: None Diabetes: No  How often do you need to have someone help you when you read instructions, pamphlets, or other written materials from your doctor or pharmacy?: 1 - Never What is the last grade level you completed in school?: HSG  Diabetic? No  Interpreter Needed?: No  Information entered by :: [employer]  Raeqwon Lux, LPN.   Activities of Daily Living    05/04/2022   11:07 AM 05/03/2022    1:15 PM  In your present state of health, do you have any difficulty performing the following activities:  Hearing? 0 0  Vision? 0 0  Difficulty concentrating or making decisions? 0 0  Walking or climbing stairs? 0 0  Dressing or bathing? 0 0  Doing errands, shopping? 0 0  Preparing Food and eating ? N N  Using the Toilet? N N  In the past six months, have you accidently leaked urine? N N  Do you have problems with loss of bowel control? N N  Managing your Medications? N N  Managing your Finances? N N  Housekeeping or managing your Housekeeping? N N    Patient Care Team: Janith Lima, MD as PCP - General (Internal Medicine) Wonda Horner, MD as Consulting Physician (Gastroenterology) Alexis Frock, MD as Consulting Physician (Urology) Lavonna Monarch, MD (Inactive) as Consulting Physician (Dermatology) Greta Doom, OD as Consulting Physician (Optometry)  Indicate any recent Medical Services you may have received from other than [facility] providers in the past year (date may be approximate).     Assessment:   This is a routine wellness examination for [place].  Hearing/Vision screen No results found.  Dietary issues and exercise activities discussed: Current Exercise Habits: Home exercise routine, Time (Minutes): 60, Frequency (Times/Week): 5, Weekly Exercise (Minutes/Week): 300, Intensity: Moderate, Exercise limited by: None identified   Goals Addressed   None   Depression Screen    04/21/2021    9:54 AM 04/12/2020    1:09 PM 03/31/2019    8:39 AM  10/22/2018    9:57 AM 05/17/2018   11:57 AM 04/13/2018    9:55 AM 03/26/2018   11:37 AM  PHQ 2/9 Scores  PHQ - 2 Score 0 0 0 0 0 0 0    Fall Risk    05/04/2022   11:07 AM 05/03/2022    1:15 PM 04/21/2021    9:57 AM 04/12/2020    1:09 PM 03/31/2019    8:39 AM  Fall Risk   Falls in the past year? 0 0 0 0 0  Number falls in past yr: 0 0 0 0 0  Injury with Fall? 0 0 0 0 0  Risk for fall due to : No Fall Risks  No Fall Risks    Follow up Falls evaluation completed  Falls evaluation completed Falls evaluation completed;Education provided     FALL RISK PREVENTION PERTAINING TO THE HOME:  Any stairs in or around the home? No  If so, are there any without handrails? No  Home free of loose throw rugs in walkways, pet beds, electrical cords, etc? Yes  Adequate lighting in your home to reduce risk of falls? Yes   ASSISTIVE DEVICES UTILIZED TO PREVENT FALLS:  Life alert? No  Use of a cane, walker or w/c? No  Grab bars in the bathroom? No  Shower chair or bench in shower? No  Elevated toilet seat or a handicapped toilet? No   TIMED UP AND GO:  Was the test performed? Yes .  Length of time to ambulate 10 feet: 8 sec.   Gait steady and fast without use of assistive device  Cognitive Function:        05/04/2022   11:08 AM 04/12/2020    1:07 PM 03/31/2019    8:40 AM 03/31/2019    8:39 AM 03/26/2018   11:40 AM  6CIT Screen  What Year? 0 points 0 points 0 points 0 points 0 points  What month? 0 points 0 points 0 points 0 points 0 points  What time? 0 points 0 points 0 points 0 points 0 points  Count back from 20 0 points 0 points 0 points  0 points  Months in reverse 0 points 0 points 0 points  0 points  Repeat phrase 0 points 0 points 0 points  0 points  Total Score 0 points 0 points 0 points  0 points    Immunizations Immunization History  Administered Date(s) Administered   Fluad Quad(high Dose 65+) 03/30/2019, 03/31/2019, 04/20/2020, 04/07/2021   Influenza, High Dose Seasonal  PF 01/02/2018, 02/06/2022   Influenza,inj,Quad PF,6+ Mos 03/02/2014, 12/25/2014, 12/28/2015, 03/21/2017   PFIZER Comirnaty(Gray Top)Covid-19 Tri-Sucrose Vaccine 02/06/2022   PFIZER(Purple Top)SARS-COV-2 Vaccination 12/20/2018, 01/09/2019, 01/09/2020, 09/21/2020   PNEUMOCOCCAL CONJUGATE-20 09/09/2020   [place] Bivalent Pediatric Vaccine(67mo to <534yr 04/24/2021   Pneumococcal Conjugate-13 03/02/2014   Pneumococcal Polysaccharide-23 12/25/2014   Respiratory Syncytial Virus Vaccine,Recomb Aduvanted(Arexvy) 02/06/2022   Tdap 09/09/2020   Zoster Recombinat (Shingrix) 09/21/2020    TDAP status: Up to date  Flu Vaccine status: Up to date  Pneumococcal vaccine status: Up to date  Covid-19 vaccine status: Completed vaccines  Qualifies for Shingles Vaccine? Yes   Zostavax completed No   Shingrix Completed?: No.    Education has been provided regarding the importance of this vaccine. Patient has been advised to call insurance company to determine out of pocket expense if they have not yet received this vaccine. Advised may also receive vaccine at local pharmacy or Health Dept. Verbalized acceptance and understanding.  Screening Tests Health Maintenance  Topic Date Due   Zoster Vaccines- Shingrix (2 of 2) 11/16/2020   COVID-19 Vaccine (7 - 2023-24 season) 04/03/2022   MAMMOGRAM  11/16/2022   Medicare Annual Wellness (AWV)  05/05/2023   COLONOSCOPY (Pts 45-4930yrnsurance coverage will need to be confirmed)  07/22/2029   DTaP/Tdap/Td (2 - Td or Tdap) 09/10/2030   Pneumonia Vaccine 65+70ears old  Completed   INFLUENZA VACCINE  Completed   DEXA SCAN  Completed   Hepatitis C Screening  Completed   HPV VACCINES  Aged Out    Health Maintenance  Health Maintenance Due  Topic Date Due   Zoster Vaccines- Shingrix (2 of 2) 11/16/2020   COVID-19 Vaccine (7 - 2023-24 season) 04/03/2022    Colorectal cancer screening: Type of screening: Colonoscopy. Completed 07/23/2019. Repeat every 10  years  Mammogram status: Completed 11/2021. Repeat every year  Bone Density status: Completed 11/01/2018. Results reflect: Bone density results: NORMAL. Repeat every 5 years.  Lung Cancer Screening: (Low Dose CT Chest recommended if Age 38-26-80ars, 30 pack-year currently smoking OR have quit w/in 15years.) does not qualify.   Lung Cancer Screening Referral: no  Additional Screening:  Hepatitis C Screening: does qualify; Completed 11/25/2014  Vision Screening: Recommended annual ophthalmology exams for early detection of glaucoma and other disorders of the eye. Is the patient up to date with their annual eye exam?  Yes  Who is the provider or what is the name of the office in which the patient attends annual eye exams? [facility]. If pt is not established with a provider, would they like to be referred to a provider to establish care? No .   Dental Screening: Recommended annual dental exams for proper oral hygiene  Community Resource Referral / Chronic Care Management: CRR required this visit?  No   CCM required this visit?  No      Plan:     I have personally reviewed and noted the following in the patient's chart:   Medical and social history Use of alcohol, tobacco or illicit drugs  Current medications and supplements including opioid prescriptions. Patient is not currently taking opioid prescriptions. Functional ability and status Nutritional status Physical activity Advanced directives List of other physicians Hospitalizations, surgeries, and ER visits in previous 12 months Vitals Screenings to include cognitive, depression, and falls Referrals and appointments  In addition, I have reviewed and discussed with patient certain preventive protocols, quality metrics, and best practice recommendations. A written personalized care plan for preventive services as well as general preventive health recommendations were provided to patient.     Sheral Flow,  LPN   [id number]   Nurse Notes:  Normal cognitive status assessed by direct observation by this Nurse Health Advisor. No abnormalities found.  Patient has good family support.

## 2022-05-09 IMAGING — DX DG CHEST 2V
2 series · 2 of 2 positions shown · non-contrast
Comparison: 12/25/2008

CLINICAL DATA: History of bladder carcinoma

EXAM:
CHEST - 2 VIEW

[chest pa]
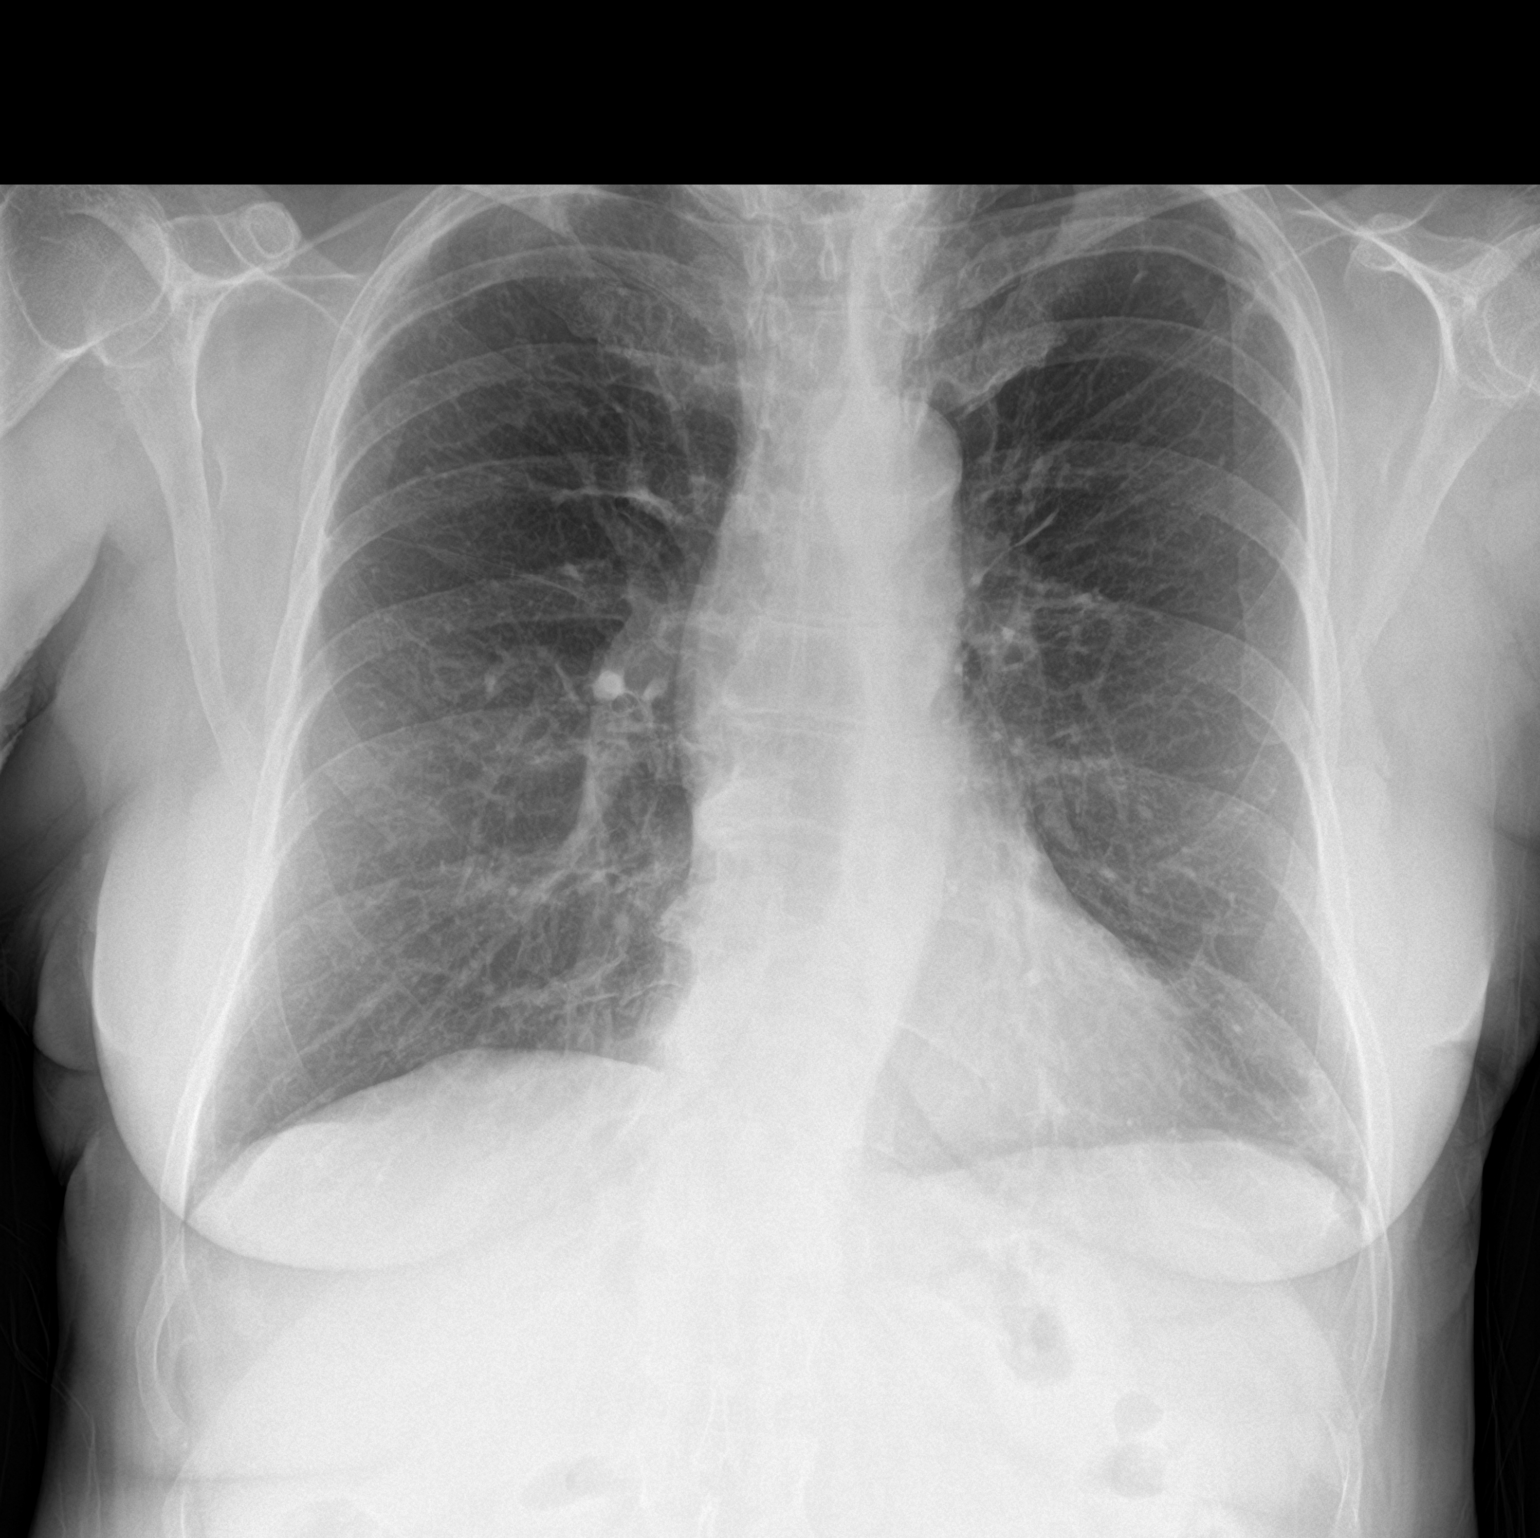

[chest lat]
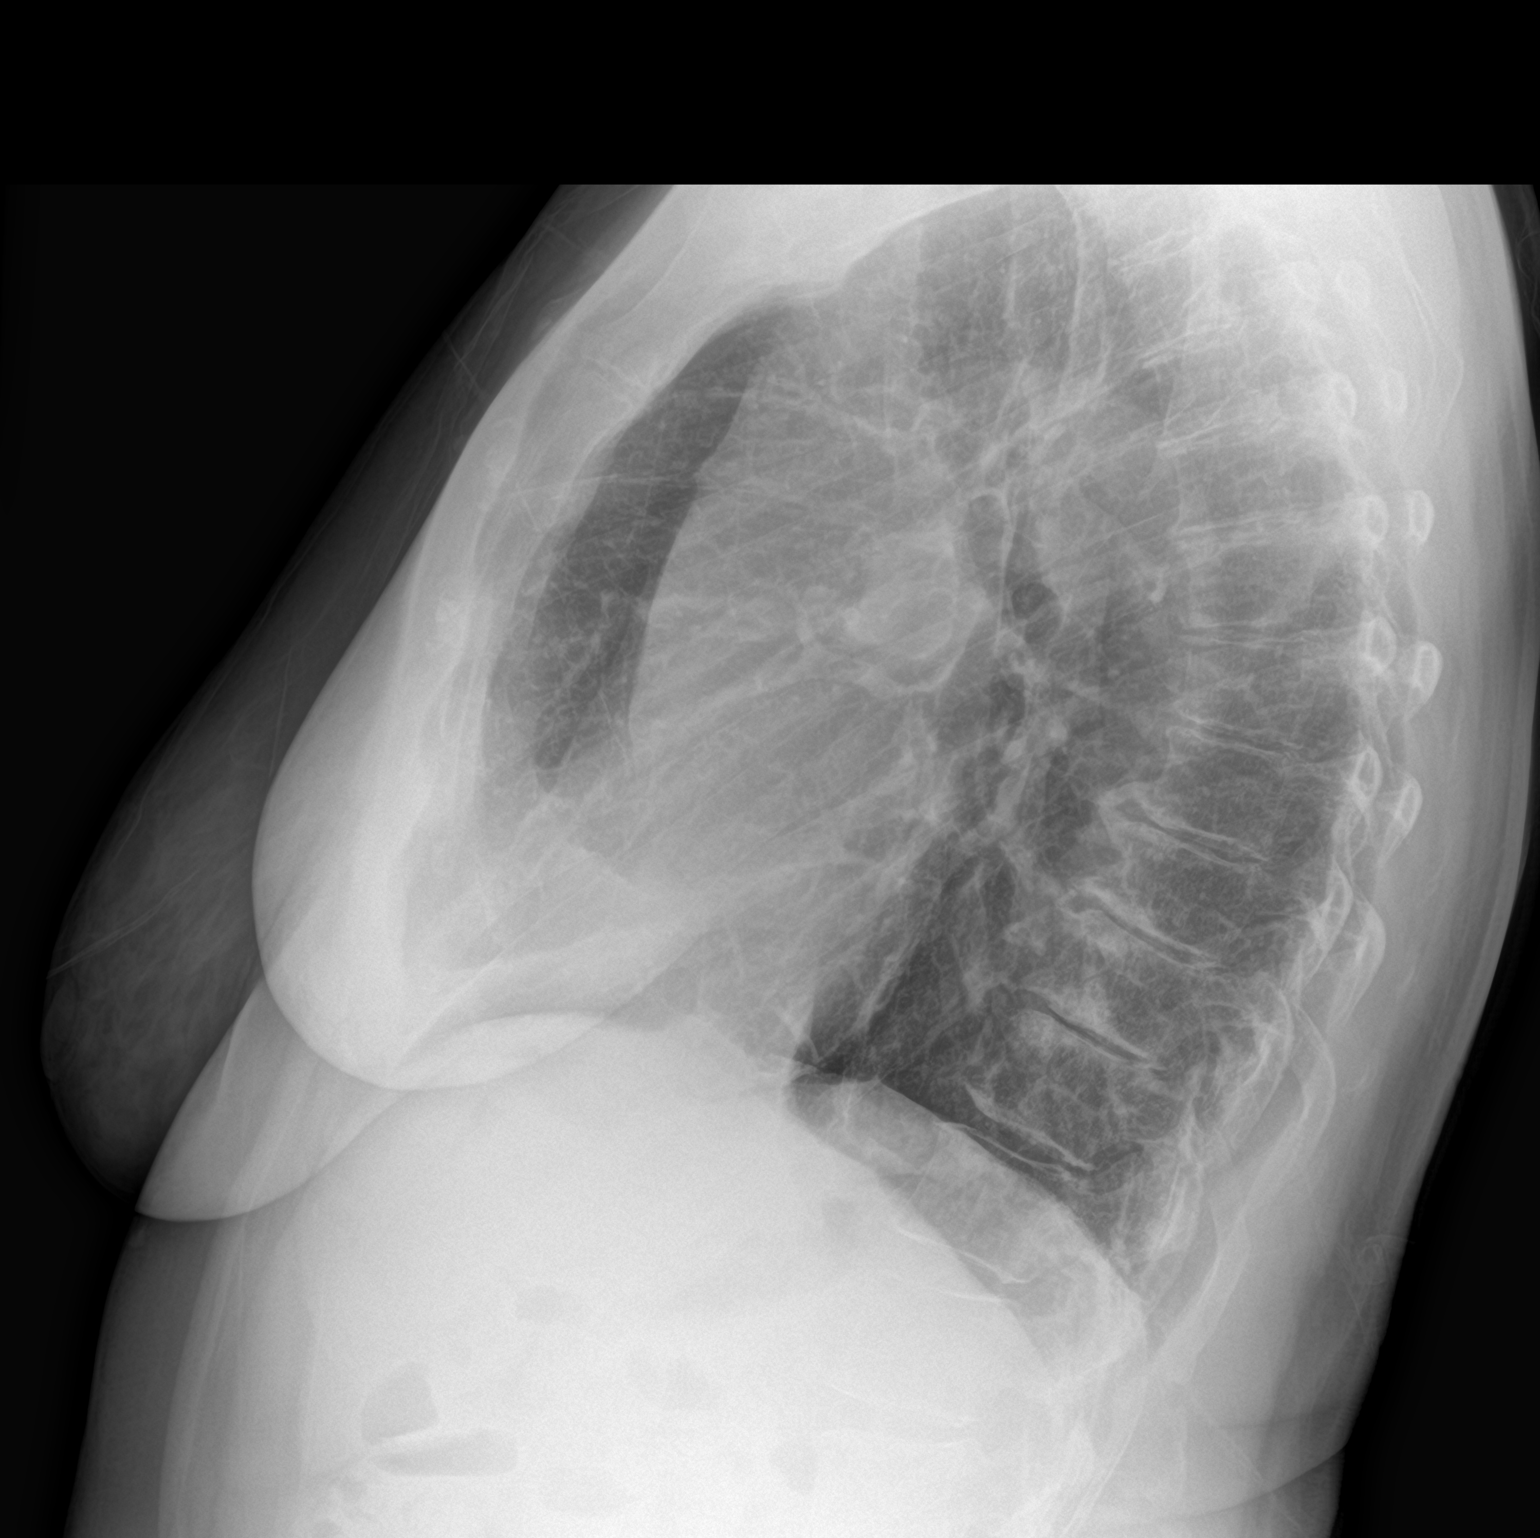

[2 of 2 positions shown; findings below may reference images not displayed]

FINDINGS: Cardiac shadow is within normal limits. Aortic calcifications are
noted. The lungs are well aerated bilaterally. Biapical pleural and
parenchymal scarring is seen. No sizable effusion is noted.
Degenerative changes of the thoracic spine are noted.
IMPRESSION: No acute abnormality noted.

## 2022-06-18 ENCOUNTER — Other Ambulatory Visit: Payer: Self-pay | Admitting: Internal Medicine

## 2022-06-18 DIAGNOSIS — I10 Essential (primary) hypertension: Secondary | ICD-10-CM

## 2022-06-25 ENCOUNTER — Other Ambulatory Visit: Payer: Self-pay | Admitting: Internal Medicine

## 2022-06-25 DIAGNOSIS — E7800 Pure hypercholesterolemia, unspecified: Secondary | ICD-10-CM

## 2022-07-24 DIAGNOSIS — C678 Malignant neoplasm of overlapping sites of bladder: Secondary | ICD-10-CM | POA: Diagnosis not present

## 2022-07-29 ENCOUNTER — Other Ambulatory Visit: Payer: Self-pay | Admitting: Internal Medicine

## 2022-07-29 DIAGNOSIS — I10 Essential (primary) hypertension: Secondary | ICD-10-CM

## 2022-08-23 ENCOUNTER — Ambulatory Visit (INDEPENDENT_AMBULATORY_CARE_PROVIDER_SITE_OTHER): Payer: Medicare Other | Admitting: Internal Medicine

## 2022-08-23 ENCOUNTER — Encounter: Payer: Self-pay | Admitting: Internal Medicine

## 2022-08-23 VITALS — BP 132/78 | HR 80 | Temp 98.2°F | Resp 16 | Ht 71.0 in | Wt 211.0 lb

## 2022-08-23 DIAGNOSIS — R9431 Abnormal electrocardiogram [ECG] [EKG]: Secondary | ICD-10-CM

## 2022-08-23 DIAGNOSIS — N1831 Chronic kidney disease, stage 3a: Secondary | ICD-10-CM | POA: Diagnosis not present

## 2022-08-23 DIAGNOSIS — C662 Malignant neoplasm of left ureter: Secondary | ICD-10-CM | POA: Diagnosis not present

## 2022-08-23 DIAGNOSIS — I10 Essential (primary) hypertension: Secondary | ICD-10-CM

## 2022-08-23 DIAGNOSIS — R7303 Prediabetes: Secondary | ICD-10-CM

## 2022-08-23 DIAGNOSIS — E7800 Pure hypercholesterolemia, unspecified: Secondary | ICD-10-CM

## 2022-08-23 DIAGNOSIS — C679 Malignant neoplasm of bladder, unspecified: Secondary | ICD-10-CM | POA: Diagnosis not present

## 2022-08-23 LAB — CBC WITH DIFFERENTIAL/PLATELET
Basophils Absolute: 0.1 10*3/uL (ref 0.0–0.1)
Basophils Relative: 0.9 % (ref 0.0–3.0)
Eosinophils Absolute: 0.2 10*3/uL (ref 0.0–0.7)
Eosinophils Relative: 2.2 % (ref 0.0–5.0)
HCT: 41.3 % (ref 36.0–46.0)
Hemoglobin: 14.2 g/dL (ref 12.0–15.0)
Lymphocytes Relative: 24.9 % (ref 12.0–46.0)
Lymphs Abs: 2 10*3/uL (ref 0.7–4.0)
MCHC: 34.3 g/dL (ref 30.0–36.0)
MCV: 91.5 fl (ref 78.0–100.0)
Monocytes Absolute: 0.6 10*3/uL (ref 0.1–1.0)
Monocytes Relative: 8 % (ref 3.0–12.0)
Neutro Abs: 5 10*3/uL (ref 1.4–7.7)
Neutrophils Relative %: 64 % (ref 43.0–77.0)
Platelets: 343 10*3/uL (ref 150.0–400.0)
RBC: 4.51 Mil/uL (ref 3.87–5.11)
RDW: 12.8 % (ref 11.5–15.5)
WBC: 7.8 10*3/uL (ref 4.0–10.5)

## 2022-08-23 LAB — LIPID PANEL
Cholesterol: 143 mg/dL (ref 0–200)
HDL: 47.2 mg/dL (ref >39.00)
LDL Cholesterol: 67 mg/dL (ref 0–99)
NonHDL: 95.74
Total CHOL/HDL Ratio: 3
Triglycerides: 145 mg/dL (ref 0.0–149.0)
VLDL: 29 mg/dL (ref 0.0–40.0)

## 2022-08-23 LAB — BASIC METABOLIC PANEL
BUN: 18 mg/dL (ref 6–23)
CO2: 31 mEq/L (ref 19–32)
Calcium: 9.7 mg/dL (ref 8.4–10.5)
Chloride: 101 mEq/L (ref 96–112)
Creatinine, Ser: 0.9 mg/dL (ref 0.40–1.20)
GFR: 62.81 mL/min (ref >60.00)
Glucose, Bld: 83 mg/dL (ref 70–99)
Potassium: 4.1 mEq/L (ref 3.5–5.1)
Sodium: 139 mEq/L (ref 135–145)

## 2022-08-23 LAB — HEPATIC FUNCTION PANEL
ALT: 25 U/L (ref 0–35)
AST: 22 U/L (ref 0–37)
Albumin: 4.4 g/dL (ref 3.5–5.2)
Alkaline Phosphatase: 68 U/L (ref 39–117)
Bilirubin, Direct: 0.1 mg/dL (ref 0.0–0.3)
Total Bilirubin: 0.6 mg/dL (ref 0.2–1.2)
Total Protein: 7.1 g/dL (ref 6.0–8.3)

## 2022-08-23 LAB — TSH: TSH: 1.47 u[IU]/mL (ref 0.35–5.50)

## 2022-08-23 LAB — HEMOGLOBIN A1C: Hgb A1c MFr Bld: 5.9 % (ref 4.6–6.5)

## 2022-08-23 MED ORDER — AMLODIPINE BESYLATE 10 MG PO TABS: 10.0000 mg | ORAL_TABLET | Freq: Every day | ORAL | 1 refills | Status: DC

## 2022-08-23 MED ORDER — TRIAMTERENE-HCTZ 37.5-25 MG PO TABS
1.0000 | ORAL_TABLET | Freq: Every day | ORAL | 1 refills | Status: DC
Start: 2022-08-23 — End: 2023-04-13

## 2022-08-23 MED ORDER — ATORVASTATIN CALCIUM 40 MG PO TABS: 40.0000 mg | ORAL_TABLET | Freq: Every day | ORAL | 1 refills | Status: DC

## 2022-08-23 NOTE — Patient Instructions (Signed)
Hypertension, Adult High blood pressure (hypertension) is when the force of blood pumping through the arteries is too strong. The arteries are the blood vessels that carry blood from the heart throughout the body. Hypertension forces the heart to work harder to pump blood and may cause arteries to become narrow or stiff. Untreated or uncontrolled hypertension can lead to a heart attack, heart failure, a stroke, kidney disease, and other problems. A blood pressure reading consists of a higher number over a lower number. Ideally, your blood pressure should be below 120/80. The first ("top") number is called the systolic pressure. It is a measure of the pressure in your arteries as your heart beats. The second ("bottom") number is called the diastolic pressure. It is a measure of the pressure in your arteries as the heart relaxes. What are the causes? The exact cause of this condition is not known. There are some conditions that result in high blood pressure. What increases the risk? Certain factors may make you more likely to develop high blood pressure. Some of these risk factors are under your control, including: Smoking. Not getting enough exercise or physical activity. Being overweight. Having too much fat, sugar, calories, or salt (sodium) in your diet. Drinking too much alcohol. Other risk factors include: Having a personal history of heart disease, diabetes, high cholesterol, or kidney disease. Stress. Having a family history of high blood pressure and high cholesterol. Having obstructive sleep apnea. Age. The risk increases with age. What are the signs or symptoms? High blood pressure may not cause symptoms. Very high blood pressure (hypertensive crisis) may cause: Headache. Fast or irregular heartbeats (palpitations). Shortness of breath. Nosebleed. Nausea and vomiting. Vision changes. Severe chest pain, dizziness, and seizures. How is this diagnosed? This condition is diagnosed by  measuring your blood pressure while you are seated, with your arm resting on a flat surface, your legs uncrossed, and your feet flat on the floor. The cuff of the blood pressure monitor will be placed directly against the skin of your upper arm at the level of your heart. Blood pressure should be measured at least twice using the same arm. Certain conditions can cause a difference in blood pressure between your right and left arms. If you have a high blood pressure reading during one visit or you have normal blood pressure with other risk factors, you may be asked to: Return on a different day to have your blood pressure checked again. Monitor your blood pressure at home for 1 week or longer. If you are diagnosed with hypertension, you may have other blood or imaging tests to help your health care provider understand your overall risk for other conditions. How is this treated? This condition is treated by making healthy lifestyle changes, such as eating healthy foods, exercising more, and reducing your alcohol intake. You may be referred for counseling on a healthy diet and physical activity. Your health care provider may prescribe medicine if lifestyle changes are not enough to get your blood pressure under control and if: Your systolic blood pressure is above 130. Your diastolic blood pressure is above 80. Your personal target blood pressure may vary depending on your medical conditions, your age, and other factors. Follow these instructions at home: Eating and drinking  Eat a diet that is high in fiber and potassium, and low in sodium, added sugar, and fat. An example of this eating plan is called the DASH diet. DASH stands for Dietary Approaches to Stop Hypertension. To eat this way: Eat   plenty of fresh fruits and vegetables. Try to fill one half of your plate at each meal with fruits and vegetables. Eat whole grains, such as whole-wheat pasta, brown rice, or whole-grain bread. Fill about one  fourth of your plate with whole grains. Eat or drink low-fat dairy products, such as skim milk or low-fat yogurt. Avoid fatty cuts of meat, processed or cured meats, and poultry with skin. Fill about one fourth of your plate with lean proteins, such as fish, chicken without skin, beans, eggs, or tofu. Avoid pre-made and processed foods. These tend to be higher in sodium, added sugar, and fat. Reduce your daily sodium intake. Many people with hypertension should eat less than 1,500 mg of sodium a day. Do not drink alcohol if: Your health care provider tells you not to drink. You are pregnant, may be pregnant, or are planning to become pregnant. If you drink alcohol: Limit how much you have to: 0-1 drink a day for women. 0-2 drinks a day for men. Know how much alcohol is in your drink. In the [place] one drink equals one 12 oz bottle of beer (355 mL), one 5 oz glass of wine (148 mL), or one 1 oz glass of hard liquor (44 mL). Lifestyle  Work with your health care provider to maintain a healthy body weight or to lose weight. Ask what an ideal weight is for you. Get at least 30 minutes of exercise that causes your heart to beat faster (aerobic exercise) most days of the week. Activities may include walking, swimming, or biking. Include exercise to strengthen your muscles (resistance exercise), such as Pilates or lifting weights, as part of your weekly exercise routine. Try to do these types of exercises for 30 minutes at least 3 days a week. Do not use any products that contain nicotine or tobacco. These products include cigarettes, chewing tobacco, and vaping devices, such as e-cigarettes. If you need help quitting, ask your health care provider. Monitor your blood pressure at home as told by your health care provider. Keep all follow-up visits. This is important. Medicines Take over-the-counter and prescription medicines only as told by your health care provider. Follow directions carefully. Blood  pressure medicines must be taken as prescribed. Do not skip doses of blood pressure medicine. Doing this puts you at risk for problems and can make the medicine less effective. Ask your health care provider about side effects or reactions to medicines that you should watch for. Contact a health care provider if you: Think you are having a reaction to a medicine you are taking. Have headaches that keep coming back (recurring). Feel dizzy. Have swelling in your ankles. Have trouble with your vision. Get help right away if you: Develop a severe headache or confusion. Have unusual weakness or numbness. Feel faint. Have severe pain in your chest or abdomen. Vomit repeatedly. Have trouble breathing. These symptoms may be an emergency. Get help right away. Call 911. Do not wait to see if the symptoms will go away. Do not drive yourself to the hospital. Summary Hypertension is when the force of blood pumping through your arteries is too strong. If this condition is not controlled, it may put you at risk for serious complications. Your personal target blood pressure may vary depending on your medical conditions, your age, and other factors. For most people, a normal blood pressure is less than 120/80. Hypertension is treated with lifestyle changes, medicines, or a combination of both. Lifestyle changes include losing weight, eating a healthy,   low-sodium diet, exercising more, and limiting alcohol. This information is not intended to replace advice given to you by your health care provider. Make sure you discuss any questions you have with your health care provider. Document Revised: 02/01/2021 Document Reviewed: 02/01/2021 Elsevier Patient Education  2023 [employer]  

## 2022-08-23 NOTE — Progress Notes (Signed)
Subjective:  Patient ID: [name], female    DOB: [birth year]  Age: 77 y.o. MRN: [id number]  CC: Hypertension and Hyperlipidemia   HPI PERLEY OMELIA presents for f/up ----  She is active and denies chest pain, shortness of breath, diaphoresis, edema, dizziness, or lightheadedness.  Outpatient Medications Prior to Visit  Medication Sig Dispense Refill   amLODipine (NORVASC) 10 MG tablet TAKE 1 TABLET BY MOUTH EVERY DAY 90 tablet 1   atorvastatin (LIPITOR) 40 MG tablet TAKE 1 TABLET BY MOUTH EVERY DAY 90 tablet 0   triamterene-hydrochlorothiazide (MAXZIDE-25) 37.5-25 MG tablet TAKE 1 TABLET BY MOUTH EVERY DAY 90 tablet 0   No facility-administered medications prior to visit.    ROS Review of Systems  Constitutional: Negative.  Negative for chills, diaphoresis, fatigue and fever.  HENT: Negative.    Eyes: Negative.   Respiratory:  Negative for cough, chest tightness, shortness of breath and wheezing.   Cardiovascular:  Negative for chest pain, palpitations and leg swelling.  Gastrointestinal:  Negative for abdominal pain, constipation, diarrhea, nausea and vomiting.  Endocrine: Negative.   Genitourinary: Negative.  Negative for difficulty urinating, dysuria and hematuria.  Musculoskeletal: Negative.   Skin: Negative.   Neurological:  Negative for dizziness, weakness, light-headedness and headaches.  Hematological:  Negative for adenopathy. Does not bruise/bleed easily.  Psychiatric/Behavioral: Negative.      Objective:  BP 132/78 (BP Location: Right Arm, Patient Position: Sitting, Cuff Size: Large)   Pulse 80   Temp 98.2 F (36.8 C) (Oral)   Resp 16   Ht 5\' 11"  (1.803 m)   Wt 211 lb (95.7 kg)   SpO2 96%   BMI 29.43 kg/m   BP Readings from Last 3 Encounters:  08/23/22 132/78  05/04/22 118/60  10/27/21 138/82    Wt Readings from Last 3 Encounters:  08/23/22 211 lb (95.7 kg)  05/04/22 218 lb 9.6 oz (99.2 kg)  10/27/21 214 lb (97.1 kg)     Physical Exam Vitals reviewed.  HENT:     Nose: Nose normal.     Mouth/Throat:     Mouth: Mucous membranes are moist.  Eyes:     General: No scleral icterus.    Conjunctiva/sclera: Conjunctivae normal.  Cardiovascular:     Rate and Rhythm: Normal rate and regular rhythm.     Heart sounds: Normal heart sounds, S1 normal and S2 normal. No murmur heard.    No gallop.     Comments: EKG- NSR, 80 bpm Flat T waves in III and V1 No LVH or Q waves Pulmonary:     Effort: Pulmonary effort is normal.     Breath sounds: No stridor. No wheezing, rhonchi or rales.  Abdominal:     General: Abdomen is flat.     Palpations: There is no mass.     Tenderness: There is no abdominal tenderness. There is no guarding.     Hernia: No hernia is present.  Musculoskeletal:     Cervical back: Neck supple.     Right lower leg: No edema.     Left lower leg: No edema.  Skin:    General: Skin is warm and dry.  Neurological:     General: No focal deficit present.     Mental Status: She is alert. Mental status is at baseline.  Psychiatric:        Mood and Affect: Mood normal.        Behavior: Behavior normal.     Lab Results  Component  Value Date   WBC 7.8 08/23/2022   HGB 14.2 08/23/2022   HCT 41.3 08/23/2022   PLT 343.0 08/23/2022   GLUCOSE 83 08/23/2022   CHOL 143 08/23/2022   TRIG 145.0 08/23/2022   HDL 47.20 08/23/2022   LDLDIRECT 93.0 10/27/2021   LDLCALC 67 08/23/2022   ALT 25 08/23/2022   AST 22 08/23/2022   NA 139 08/23/2022   K 4.1 08/23/2022   CL 101 08/23/2022   CREATININE 0.90 08/23/2022   BUN 18 08/23/2022   CO2 31 08/23/2022   TSH 1.47 08/23/2022   INR 1.4 05/05/2008   HGBA1C 5.9 08/23/2022    No results found.  Assessment & Plan:   Prediabetes- Her A1c is down to 5.9%. -     Hemoglobin A1c; Future  Stage 3a chronic kidney disease (HCC)- Her renal function has improved. -     Basic metabolic panel; Future  Essential hypertension, benign- Her blood pressure  is well-controlled.  EKG is negative for LVH. -     Basic metabolic panel; Future -     CBC with Differential/Platelet; Future -     TSH; Future -     EKG 12-Lead -     CT CARDIAC SCORING (SELF PAY ONLY); Future -     amLODIPine Besylate; Take 1 tablet (10 mg total) by mouth daily.  Dispense: 90 tablet; Refill: 1 -     Triamterene-HCTZ; Take 1 tablet by mouth daily.  Dispense: 90 tablet; Refill: 1  Pure hypercholesterolemia- LDL goal achieved. Doing well on the statin  -     Lipid panel; Future -     TSH; Future -     Hepatic function panel; Future -     CT CARDIAC SCORING (SELF PAY ONLY); Future -     Atorvastatin Calcium; Take 1 tablet (40 mg total) by mouth daily.  Dispense: 90 tablet; Refill: 1  Abnormal electrocardiogram (ECG) (EKG)- Will evaluate for CAD with a coronary calcium score. -     CT CARDIAC SCORING (SELF PAY ONLY); Future  Abnormal electrocardiogram -     CT CARDIAC SCORING (SELF PAY ONLY); Future  Malignant neoplasm of urinary bladder, unspecified site [facility])- She recently saw urology and was told that this has been adequately treated.  Ureteral cancer, left [facility])- She tells me her recent UA was negative.     Follow-up: Return in about 4 months (around 12/24/2022).  Sanda Linger, MD

## 2022-09-12 ENCOUNTER — Ambulatory Visit (HOSPITAL_COMMUNITY)
Admission: RE | Admit: 2022-09-12 | Discharge: 2022-09-12 | Disposition: A | Discharge status: Home / Self Care | Payer: Medicare Other | Source: Ambulatory Visit | Attending: Internal Medicine | Admitting: Internal Medicine

## 2022-09-12 ENCOUNTER — Encounter (HOSPITAL_COMMUNITY): Payer: Self-pay

## 2022-09-12 DIAGNOSIS — I10 Essential (primary) hypertension: Secondary | ICD-10-CM

## 2022-09-12 DIAGNOSIS — R9431 Abnormal electrocardiogram [ECG] [EKG]: Secondary | ICD-10-CM | POA: Insufficient documentation

## 2022-09-12 DIAGNOSIS — E7800 Pure hypercholesterolemia, unspecified: Secondary | ICD-10-CM | POA: Insufficient documentation

## 2022-09-13 ENCOUNTER — Encounter: Payer: Self-pay | Admitting: Internal Medicine

## 2022-10-23 ENCOUNTER — Encounter: Payer: Self-pay | Admitting: Internal Medicine

## 2022-11-27 DIAGNOSIS — Z1231 Encounter for screening mammogram for malignant neoplasm of breast: Secondary | ICD-10-CM | POA: Diagnosis not present

## 2022-11-27 DIAGNOSIS — R300 Dysuria: Secondary | ICD-10-CM | POA: Diagnosis not present

## 2022-11-27 LAB — HM MAMMOGRAPHY

## 2022-11-28 ENCOUNTER — Encounter: Payer: Self-pay | Admitting: Internal Medicine

## 2022-12-18 ENCOUNTER — Other Ambulatory Visit: Payer: Self-pay | Admitting: Medical Genetics

## 2022-12-18 DIAGNOSIS — Z006 Encounter for examination for normal comparison and control in clinical research program: Secondary | ICD-10-CM

## 2022-12-26 ENCOUNTER — Other Ambulatory Visit: Payer: Self-pay | Admitting: Internal Medicine

## 2022-12-26 DIAGNOSIS — E7800 Pure hypercholesterolemia, unspecified: Secondary | ICD-10-CM

## 2023-02-20 ENCOUNTER — Other Ambulatory Visit (HOSPITAL_COMMUNITY)
Admission: RE | Admit: 2023-02-20 | Discharge: 2023-02-20 | Disposition: A | Discharge status: Home / Self Care | Payer: Medicare Other | Source: Ambulatory Visit | Attending: Oncology | Admitting: Oncology

## 2023-02-20 DIAGNOSIS — Z006 Encounter for examination for normal comparison and control in clinical research program: Secondary | ICD-10-CM | POA: Insufficient documentation

## 2023-02-27 LAB — HELIX MOLECULAR SCREEN: Genetic Analysis Overall Interpretation: NEGATIVE

## 2023-02-27 LAB — GENECONNECT MOLECULAR SCREEN

## 2023-03-12 ENCOUNTER — Other Ambulatory Visit: Payer: Self-pay | Admitting: Internal Medicine

## 2023-03-16 ENCOUNTER — Encounter: Payer: Self-pay | Admitting: Internal Medicine

## 2023-03-19 ENCOUNTER — Other Ambulatory Visit: Payer: Self-pay

## 2023-03-19 DIAGNOSIS — I10 Essential (primary) hypertension: Secondary | ICD-10-CM

## 2023-03-19 MED ORDER — AMLODIPINE BESYLATE 10 MG PO TABS: 10.0000 mg | ORAL_TABLET | Freq: Every day | ORAL | 0 refills | Status: DC

## 2023-04-13 ENCOUNTER — Ambulatory Visit (INDEPENDENT_AMBULATORY_CARE_PROVIDER_SITE_OTHER): Payer: Medicare Other

## 2023-04-13 ENCOUNTER — Other Ambulatory Visit: Payer: Self-pay | Admitting: Internal Medicine

## 2023-04-13 DIAGNOSIS — Z0000 Encounter for general adult medical examination without abnormal findings: Secondary | ICD-10-CM

## 2023-04-13 DIAGNOSIS — I10 Essential (primary) hypertension: Secondary | ICD-10-CM

## 2023-04-13 MED ORDER — TRIAMTERENE-HCTZ 37.5-25 MG PO TABS: 1.0000 | ORAL_TABLET | Freq: Every day | ORAL | 0 refills | Status: DC

## 2023-04-13 NOTE — Progress Notes (Signed)
 Subjective:   [name] is a 78 y.o. female who presents for Medicare Annual (Subsequent) preventive examination.  Visit Complete: Virtual I connected with  [name] on 04/13/23 by a video and audio enabled telemedicine application and verified that I am speaking with the correct person using two identifiers.  Patient Location: Home  Provider Location: Home Office  I discussed the limitations of evaluation and management by telemedicine. The patient expressed understanding and agreed to proceed.  Vital Signs: Because this visit was a virtual/telehealth visit, some criteria may be missing or patient reported. Any vitals not documented were not able to be obtained and vitals that have been documented are patient reported.  Patient Medicare AWV questionnaire was completed by the patient on 04/09/2023; I have confirmed that all information answered by patient is correct and no changes since this date.  Cardiac Risk Factors include: advanced age (>55men, >70 women);hypertension     Objective:    Today's Vitals   There is no height or weight on file to calculate BMI.     04/13/2023    8:58 AM 05/04/2022   11:21 AM 04/21/2021    9:56 AM 01/21/2021   11:42 AM 04/12/2020    1:06 PM 10/03/2019    4:18 PM 10/03/2019    9:30 AM  Advanced Directives  Does Patient Have a Medical Advance Directive? Yes Yes Yes Yes Yes Yes Yes  Type of Estate Agent of [place];Living will Healthcare Power of [place];Living will Living will;Healthcare Power of [employer] Power of [employer] Power of [place];Living will Healthcare Power of [place];Living will  Does patient want to make changes to medical advance directive?   No - Patient declined  No - Patient declined No - Patient declined   Copy of Healthcare Power of Attorney in Chart? No - copy requested No - copy requested No - copy requested  No - copy requested No - copy requested     Current  Medications (verified) Outpatient Encounter Medications as of 04/13/2023  Medication Sig   amLODipine  (NORVASC ) 10 MG tablet Take 1 tablet (10 mg total) by mouth daily.   atorvastatin  (LIPITOR) 40 MG tablet TAKE 1 TABLET BY MOUTH EVERY DAY   triamterene -hydrochlorothiazide (MAXZIDE-25) 37.5-25 MG tablet Take 1 tablet by mouth daily.   No facility-administered encounter medications on file as of 04/13/2023.    Allergies (verified) Hycodan [hydrocodone bit-homatrop mbr]   History: Past Medical History:  Diagnosis Date   Bladder cancer (HCC) RECURRENT    UROLOGIST-  [facility]   Duplex kidney    Duplex kidney    RIGHT   History of adenomatous polyp of colon    History of recurrent UTIs    Hyperlipidemia    Hypertension    Wears contact lenses    Past Surgical History:  Procedure Laterality Date   BENIGN BREAST BX Right 1995   BUNIONECTOMY Right 2000   bunionectomy left foot  04/2016   COLONOSCOPY W/ POLYPECTOMY  01/06/2013   single sessile polyp ascending colon.  [facility].   CYSTOSCOPY W/ RETROGRADES Bilateral 12/10/2013   Procedure: RETROGRADE PYELOGRAM  ;  Surgeon: Ricardo Likens, MD;  Location: [facility];  Service: Urology;  Laterality: Bilateral;   CYSTOSCOPY W/ RETROGRADES Bilateral 07/15/2014   Procedure: CYSTOSCOPY WITH RETROGRADE PYELOGRAM AND INSTILLATION OF MITOMYCIN  C;  Surgeon: Ricardo Likens, MD;  Location[facility];  Service: Urology;  Laterality: Bilateral;   CYSTOSCOPY W/ RETROGRADES Bilateral 12/31/2015  Procedure: CYSTOSCOPY WITH RETROGRADE PYELOGRAM;  Surgeon: Ricardo Likens, MD;  Location: [facility];  Service: Urology;  Laterality: Bilateral;   CYSTOSCOPY W/ RETROGRADES Bilateral 11/17/2016   Procedure: CYSTOSCOPY WITH RETROGRADE PYELOGRAM;  Surgeon: Likens Ricardo, MD;  Location: [facility];  Service: Urology;  Laterality: Bilateral;   CYSTOSCOPY W/ RETROGRADES Bilateral 06/13/2019   Procedure:  CYSTOSCOPY WITH RETROGRADE PYELOGRAM;  Surgeon: Likens Ricardo, MD;  Location: [facility];  Service: Urology;  Laterality: Bilateral;   CYSTOSCOPY W/ RETROGRADES Bilateral 01/21/2021   Procedure: CYSTOSCOPY WITH RETROGRADE PYELOGRAM;  Surgeon: Likens Ricardo, MD;  Location: [facility];  Service: Urology;  Laterality: Bilateral;   CYSTOSCOPY W/ URETERAL STENT PLACEMENT Bilateral 10/22/2013   Procedure: CYSTOSCOPY WITH RETROGRADE PYELOGRAM;  Surgeon: Ricardo Likens, MD;  Location: [facility];  Service: Urology;  Laterality: Bilateral;   CYSTOSCOPY W/ URETERAL STENT PLACEMENT Left 10/03/2019   Procedure: CYSTOSCOPY WITH STENT REPLACEMENT;  Surgeon: Likens Ricardo, MD;  Location: [facility];  Service: Urology;  Laterality: Left;   CYSTOSCOPY WITH BIOPSY N/A 01/21/2021   Procedure: CYSTOSCOPY WITH BIOPSY;  Surgeon: Likens Ricardo, MD;  Location[facility];  Service: Urology;  Laterality: N/A;   CYSTOSCOPY WITH FULGERATION N/A 01/21/2021   Procedure: CYSTOSCOPY WITH FULGERATION;  Surgeon: Likens Ricardo, MD;  Location: [facility];  Service: Urology;  Laterality: N/A;   CYSTOSCOPY WITH RETROGRADE PYELOGRAM, URETEROSCOPY AND STENT PLACEMENT Bilateral 04/14/2015   Procedure: CYSTOSCOPY WITH RETROGRADE PYELOGRAM, URETEROSCOPY AND RIGHT STENT PLACEMENT;  Surgeon: Ricardo Likens, MD;  Location: [facility];  Service: Urology;  Laterality: Bilateral;   TONSILLECTOMY  89   (age 50)   TRANSURETHRAL RESECTION OF BLADDER TUMOR N/A 12/31/2015   Procedure: TRANSURETHRAL RESECTION OF BLADDER TUMOR (TURBT) WITH GYRUS;  Surgeon: Ricardo Likens, MD;  Location: [facility];  Service: Urology;  Laterality: N/A;   TRANSURETHRAL RESECTION OF BLADDER TUMOR N/A 11/17/2016   Procedure: TRANSURETHRAL RESECTION OF BLADDER TUMOR (TURBT);  Surgeon: Likens Ricardo, MD;  Location: [facility];  Service: Urology;   Laterality: N/A;   TRANSURETHRAL RESECTION OF BLADDER TUMOR N/A 06/13/2019   Procedure: TRANSURETHRAL RESECTION OF BLADDER TUMOR (TURBT). LEFT DIAGNOSTIC URETEROSCOPY AND LEFT STENT PLACEMENT;  Surgeon: Likens Ricardo, MD;  Location: [facility];  Service: Urology;  Laterality: N/A;  45 MINS   TRANSURETHRAL RESECTION OF BLADDER TUMOR WITH GYRUS (TURBT-GYRUS) N/A 10/22/2013   Procedure: TRANSURETHRAL RESECTION OF BLADDER TUMOR WITH ERBE.;  Surgeon: Ricardo Likens, MD;  Location: [facility];  Service: Urology;  Laterality: N/A;   TRANSURETHRAL RESECTION OF BLADDER TUMOR WITH GYRUS (TURBT-GYRUS) N/A 12/10/2013   Procedure: RESTAGING TRANSURETHRAL RESECTION OF BLADDER TUMOR WITH GYRUS (TURBT-GYRUS);  Surgeon: Ricardo Likens, MD;  Location: [facility];  Service: Urology;  Laterality: N/A;   TRANSURETHRAL RESECTION OF BLADDER TUMOR WITH GYRUS (TURBT-GYRUS) N/A 07/15/2014   Procedure: TRANSURETHRAL RESECTION OF BLADDER TUMOR WITH GYRUS (TURBT-GYRUS);  Surgeon: Ricardo Likens, MD;  Location: [facility];  Service: Urology;  Laterality: N/A;   TRANSURETHRAL RESECTION OF BLADDER TUMOR WITH GYRUS (TURBT-GYRUS) N/A 04/14/2015   Procedure: TRANSURETHRAL RESECTION OF BLADDER TUMOR WITH GYRUS (TURBT-GYRUS);  Surgeon: Ricardo Likens, MD;  Location: [facility];  Service: Urology;  Laterality: N/A;   Family History  Problem Relation Age of Onset   Hypertension Mother    Arthritis Mother        knee replacement; shoulder replacement  Heart disease Mother        AAA; mild AMI in 2018   Stroke Mother 78       TIA   Cancer Father        prostate cancer   Cancer Brother        prostate cancer   Cancer Brother 58       Melanoma   Mental illness Sister 13       bipolar disorder   Social History   Socioeconomic History   Marital status: Divorced    Spouse name: Not on file   Number of children: 3   Years of education: Not on file    Highest education level: Bachelor's degree (e.g., BA, AB, BS)  Occupational History   Occupation: retired    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  Tobacco Use   Smoking status: Former    Current packs/day: 0.00    Average packs/day: 0.2 packs/day for 20.0 years (3.0 ttl pk-yrs)    Types: Cigarettes    Start date: 10/17/1992    Quit date: 10/17/2012    Years since quitting: 10.4   Smokeless tobacco: Never  Vaping Use   Vaping status: Never Used  Substance and Sexual Activity   Alcohol use: Yes    Comment: OCCASIONAL   Drug use: No   Sexual activity: Not Currently    Birth control/protection: Post-menopausal  Other Topics Concern   Not on file  Social History Narrative   Marital status:  Divorced; not dating in 2018.      Children:  3 children; 2 grandchildren (5,4); family within walking distance.        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      Tobacco:  None; quit in 09/2012.      Alcohol: 1 drink per week.       Drugs:  None      Exercise:  One hour of water  aerobics 3-4 days per week.      Seatbelt: 100%      Guns:  none      ADLs: independent with all ADLs; drives; no assistant device.      Advanced Directives:  Yes; FULL CODE; no prolonged measures.   Social Drivers of Corporate Investment Banker Strain: Low Risk  (04/13/2023)   Overall Financial Resource Strain (CARDIA)    Difficulty of Paying Living Expenses: Not hard at all  Food Insecurity: No Food Insecurity (04/13/2023)   Hunger Vital Sign    Worried About Running Out of Food in the Last Year: Never true    Ran Out of Food in the Last Year: Never true  Transportation Needs: No Transportation Needs (04/13/2023)   PRAPARE - Administrator, Civil Service (Medical): No    Lack of Transportation (Non-Medical): No  Physical Activity: Sufficiently Active (04/13/2023)   Exercise Vital Sign    Days of Exercise per  Week: 3 days    Minutes of Exercise per Session: 60 min  Stress: No Stress Concern Present (04/13/2023)   [employer] of Occupational Health - Occupational Stress Questionnaire    Feeling of Stress : Not at all  Social Connections: Moderately Isolated (04/13/2023)   Social Connection and Isolation Panel [NHANES]    Frequency of Communication with Friends and Family: More than three times a week    Frequency of Social Gatherings with Friends and Family: More than  three times a week    Attends Religious Services: Never    Active Member of Clubs or Organizations: Yes    Attends Engineer, Structural: More than 4 times per year    Marital Status: Divorced    Tobacco Counseling Counseling given: Not Answered   Clinical Intake:  Pre-visit preparation completed: Yes  Pain : No/denies pain     Nutritional Risks: None Diabetes: No  How often do you need to have someone help you when you read instructions, pamphlets, or other written materials from your doctor or pharmacy?: 1 - Never  Interpreter Needed?: No  Information entered by :: NAllen LPN   Activities of Daily Living    04/09/2023    8:37 AM 05/04/2022   11:07 AM  In your present state of health, do you have any difficulty performing the following activities:  Hearing? 0 0  Vision? 0 0  Difficulty concentrating or making decisions? 0 0  Walking or climbing stairs? 0 0  Dressing or bathing? 0 0  Doing errands, shopping? 0 0  Preparing Food and eating ? N N  Using the Toilet? N N  In the past six months, have you accidently leaked urine? N N  Do you have problems with loss of bowel control? N N  Managing your Medications? N N  Managing your Finances? N N  Housekeeping or managing your Housekeeping? N N    Patient Care Team: Joshua Debby CROME, MD as PCP - General (Internal Medicine) Lennard Lesta FALCON, MD as Consulting Physician (Gastroenterology) Alvaro Ricardo KATHEE Raddle., MD as Consulting Physician  (Urology) Livingston Rigg, MD (Inactive) as Consulting Physician (Dermatology) Madelyn Deanne BRAVO, OD as Consulting Physician (Optometry)  Indicate any recent Medical Services you may have received from other than [facility] providers in the past year (date may be approximate).     Assessment:   This is a routine wellness examination for [place].  Hearing/Vision screen Hearing Screening - Comments:: Denies hearing issues Vision Screening - Comments:: Regular eye exams, [facility]   Goals Addressed             This Visit's Progress    Patient Stated       04/13/2023, wants to lose weight, maintain mobility and flexibility       Depression Screen    04/13/2023    8:59 AM 05/04/2022   11:23 AM 04/21/2021    9:54 AM 04/12/2020    1:09 PM 03/31/2019    8:39 AM 10/22/2018    9:57 AM 05/17/2018   11:57 AM  PHQ 2/9 Scores  PHQ - 2 Score 1 0 0 0 0 0 0    Fall Risk    04/09/2023    8:37 AM 05/04/2022   11:07 AM 05/03/2022    1:15 PM 04/21/2021    9:57 AM 04/12/2020    1:09 PM  Fall Risk   Falls in the past year? 0 0 0 0 0  Number falls in past yr: 0 0 0 0 0  Injury with Fall? 0 0 0 0 0  Risk for fall due to : Medication side effect No Fall Risks  No Fall Risks   Follow up Falls prevention discussed;Falls evaluation completed Falls evaluation completed  Falls evaluation completed Falls evaluation completed;Education provided    MEDICARE RISK AT HOME: Medicare Risk at Home Any stairs in or around the home?: (Patient-Rptd) No If so, are there any without handrails?: (Patient-Rptd) No Home free of loose throw rugs in  walkways, pet beds, electrical cords, etc?: (Patient-Rptd) Yes Adequate lighting in your home to reduce risk of falls?: (Patient-Rptd) Yes Life alert?: (Patient-Rptd) No Use of a cane, walker or w/c?: (Patient-Rptd) No Grab bars in the bathroom?: (Patient-Rptd) No Shower chair or bench in shower?: (Patient-Rptd) No Elevated toilet seat or a handicapped toilet?:  (Patient-Rptd) No  TIMED UP AND GO:  Was the test performed?  No    Cognitive Function:        04/13/2023    9:00 AM 05/04/2022   11:08 AM 04/12/2020    1:07 PM 03/31/2019    8:40 AM 03/31/2019    8:39 AM  6CIT Screen  What Year? 0 points 0 points 0 points 0 points 0 points  What month? 0 points 0 points 0 points 0 points 0 points  What time? 0 points 0 points 0 points 0 points 0 points  Count back from 20 0 points 0 points 0 points 0 points   Months in reverse 0 points 0 points 0 points 0 points   Repeat phrase 0 points 0 points 0 points 0 points   Total Score 0 points 0 points 0 points 0 points     Immunizations Immunization History  Administered Date(s) Administered   Fluad Quad(high Dose 65+) 03/30/2019, 03/31/2019, 04/20/2020, 04/07/2021, 02/06/2022   Influenza, High Dose Seasonal PF 01/02/2018, 02/06/2022   Influenza,inj,Quad PF,6+ Mos 03/02/2014, 12/25/2014, 12/28/2015, 03/21/2017   PFIZER Comirnaty(Gray Top)Covid-19 Tri-Sucrose Vaccine 02/06/2022   PFIZER(Purple Top)SARS-COV-2 Vaccination 12/20/2018, 01/09/2019, 01/09/2020, 09/21/2020   PNEUMOCOCCAL CONJUGATE-20 09/09/2020   Pfizer Covid Bivalent Pediatric Vaccine(12mos to <36yrs) 04/24/2021   Pfizer Covid-19 Vaccine Bivalent Booster 71yrs & up 04/24/2021   Pfizer(Comirnaty)Fall Seasonal Vaccine 12 years and older 02/06/2022   Pneumococcal Conjugate-13 03/02/2014   Pneumococcal Polysaccharide-23 12/25/2014   Respiratory Syncytial Virus Vaccine,Recomb Aduvanted(Arexvy) 02/06/2022   Tdap 09/09/2020   Zoster Recombinant(Shingrix) 09/21/2020    TDAP status: Up to date  Flu Vaccine status: Up to date  Pneumococcal vaccine status: Up to date  Covid-19 vaccine status: Completed vaccines  Qualifies for Shingles Vaccine? Yes   Zostavax completed Yes   Shingrix Completed?: needs second dose  Screening Tests Health Maintenance  Topic Date Due   Zoster Vaccines- Shingrix (2 of 2) 11/16/2020   COVID-19 Vaccine (8 -  2024-25 season) 01/31/2023   Medicare Annual Wellness (AWV)  04/12/2024   Colonoscopy  07/22/2029   DTaP/Tdap/Td (2 - Td or Tdap) 09/10/2030   Pneumonia Vaccine 78+ Years old  Completed   INFLUENZA VACCINE  Completed   DEXA SCAN  Completed   Hepatitis C Screening  Completed   HPV VACCINES  Aged Out    Health Maintenance  Health Maintenance Due  Topic Date Due   Zoster Vaccines- Shingrix (2 of 2) 11/16/2020   COVID-19 Vaccine (8 - 2024-25 season) 01/31/2023    Colorectal cancer screening: No longer required.   Mammogram status: Completed 11/27/2022. Repeat every year  Bone Density status: Completed 11/01/2018.   Lung Cancer Screening: (Low Dose CT Chest recommended if Age 76-80 years, 20 pack-year currently smoking OR have quit w/in 15years.) does not qualify.   Lung Cancer Screening Referral: no  Additional Screening:  Hepatitis C Screening: does qualify; Completed 11/25/2014  Vision Screening: Recommended annual ophthalmology exams for early detection of glaucoma and other disorders of the eye. Is the patient up to date with their annual eye exam?  Yes  Who is the provider or what is the name of the office in which the  patient attends annual eye exams? [facility] If pt is not established with a provider, would they like to be referred to a provider to establish care? No .   Dental Screening: Recommended annual dental exams for proper oral hygiene  Diabetic Foot Exam: n/a  Community Resource Referral / Chronic Care Management: CRR required this visit?  No   CCM required this visit?  No     Plan:     I have personally reviewed and noted the following in the patient's chart:   Medical and social history Use of alcohol, tobacco or illicit drugs  Current medications and supplements including opioid prescriptions. Patient is not currently taking opioid prescriptions. Functional ability and status Nutritional status Physical activity Advanced  directives List of other physicians Hospitalizations, surgeries, and ER visits in previous 12 months Vitals Screenings to include cognitive, depression, and falls Referrals and appointments  In addition, I have reviewed and discussed with patient certain preventive protocols, quality metrics, and best practice recommendations. A written personalized care plan for preventive services as well as general preventive health recommendations were provided to patient.     Ardella FORBES Dawn, LPN   11/13/7972   After Visit Summary: (MyChart) Due to this being a telephonic visit, the after visit summary with patients personalized plan was offered to patient via MyChart   Nurse Notes: none

## 2023-04-13 NOTE — Patient Instructions (Signed)
 [name] , Thank you for taking time to come for your Medicare Wellness Visit. I appreciate your ongoing commitment to your health goals. Please review the following plan we discussed and let me know if I can assist you in the future.   Referrals/Orders/Follow-Ups/Clinician Recommendations: none  This is a list of the screening recommended for you and due dates:  Health Maintenance  Topic Date Due   Zoster (Shingles) Vaccine (2 of 2) 11/16/2020   COVID-19 Vaccine (8 - 2024-25 season) 01/31/2023   Medicare Annual Wellness Visit  04/12/2024   Colon Cancer Screening  07/22/2029   DTaP/Tdap/Td vaccine (2 - Td or Tdap) 09/10/2030   Pneumonia Vaccine  Completed   Flu Shot  Completed   DEXA scan (bone density measurement)  Completed   Hepatitis C Screening  Completed   HPV Vaccine  Aged Out    Advanced directives: (Copy Requested) Please bring a copy of your health care power of attorney and living will to the office to be added to your chart at your convenience.  Next Medicare Annual Wellness Visit scheduled for next year: Yes  Insert Preventive Care attachment Insert FALL PREVENTION attachment if needed

## 2023-04-24 ENCOUNTER — Ambulatory Visit: Payer: Medicare Other | Admitting: Internal Medicine

## 2023-04-24 ENCOUNTER — Encounter: Payer: Self-pay | Admitting: Internal Medicine

## 2023-04-24 VITALS — BP 138/78 | HR 77 | Temp 98.4°F | Resp 16 | Ht 71.0 in | Wt 216.0 lb

## 2023-04-24 DIAGNOSIS — Z860100 Personal history of colon polyps, unspecified: Secondary | ICD-10-CM | POA: Insufficient documentation

## 2023-04-24 DIAGNOSIS — R7303 Prediabetes: Secondary | ICD-10-CM | POA: Diagnosis not present

## 2023-04-24 DIAGNOSIS — N1831 Chronic kidney disease, stage 3a: Secondary | ICD-10-CM | POA: Diagnosis not present

## 2023-04-24 DIAGNOSIS — E7800 Pure hypercholesterolemia, unspecified: Secondary | ICD-10-CM | POA: Diagnosis not present

## 2023-04-24 DIAGNOSIS — I10 Essential (primary) hypertension: Secondary | ICD-10-CM

## 2023-04-24 DIAGNOSIS — B351 Tinea unguium: Secondary | ICD-10-CM | POA: Diagnosis not present

## 2023-04-24 DIAGNOSIS — Z0001 Encounter for general adult medical examination with abnormal findings: Secondary | ICD-10-CM | POA: Diagnosis not present

## 2023-04-24 DIAGNOSIS — I7121 Aneurysm of the ascending aorta, without rupture: Secondary | ICD-10-CM | POA: Diagnosis not present

## 2023-04-24 LAB — BASIC METABOLIC PANEL
BUN: 18 mg/dL (ref 6–23)
CO2: 31 meq/L (ref 19–32)
Calcium: 9.5 mg/dL (ref 8.4–10.5)
Chloride: 102 meq/L (ref 96–112)
Creatinine, Ser: 0.94 mg/dL (ref 0.40–1.20)
GFR: 59.34 mL/min — ABNORMAL LOW (ref >60.00)
Glucose, Bld: 97 mg/dL (ref 70–99)
Potassium: 4.3 meq/L (ref 3.5–5.1)
Sodium: 139 meq/L (ref 135–145)

## 2023-04-24 LAB — LIPID PANEL
Cholesterol: 151 mg/dL (ref 0–200)
HDL: 52.2 mg/dL (ref >39.00)
LDL Cholesterol: 72 mg/dL (ref 0–99)
NonHDL: 98.37
Total CHOL/HDL Ratio: 3
Triglycerides: 134 mg/dL (ref 0.0–149.0)
VLDL: 26.8 mg/dL (ref 0.0–40.0)

## 2023-04-24 LAB — URINALYSIS, ROUTINE W REFLEX MICROSCOPIC
Bilirubin Urine: NEGATIVE
Hgb urine dipstick: NEGATIVE
Ketones, ur: NEGATIVE
Leukocytes,Ua: NEGATIVE
Nitrite: NEGATIVE
Specific Gravity, Urine: 1.02 (ref 1.000–1.030)
Total Protein, Urine: NEGATIVE
Urine Glucose: NEGATIVE
Urobilinogen, UA: 0.2 (ref 0.0–1.0)
pH: 6 (ref 5.0–8.0)

## 2023-04-24 LAB — CBC WITH DIFFERENTIAL/PLATELET
Basophils Absolute: 0 10*3/uL (ref 0.0–0.1)
Basophils Relative: 0.9 % (ref 0.0–3.0)
Eosinophils Absolute: 0.2 10*3/uL (ref 0.0–0.7)
Eosinophils Relative: 3.2 % (ref 0.0–5.0)
HCT: 39.6 % (ref 36.0–46.0)
Hemoglobin: 13.5 g/dL (ref 12.0–15.0)
Lymphocytes Relative: 32.2 % (ref 12.0–46.0)
Lymphs Abs: 1.7 10*3/uL (ref 0.7–4.0)
MCHC: 34 g/dL (ref 30.0–36.0)
MCV: 93.9 fL (ref 78.0–100.0)
Monocytes Absolute: 0.5 10*3/uL (ref 0.1–1.0)
Monocytes Relative: 9.3 % (ref 3.0–12.0)
Neutro Abs: 3 10*3/uL (ref 1.4–7.7)
Neutrophils Relative %: 54.4 % (ref 43.0–77.0)
Platelets: 342 10*3/uL (ref 150.0–400.0)
RBC: 4.22 Mil/uL (ref 3.87–5.11)
RDW: 12.8 % (ref 11.5–15.5)
WBC: 5.4 10*3/uL (ref 4.0–10.5)

## 2023-04-24 LAB — TSH: TSH: 1.23 u[IU]/mL (ref 0.35–5.50)

## 2023-04-24 LAB — HEPATIC FUNCTION PANEL
ALT: 25 U/L (ref 0–35)
AST: 25 U/L (ref 0–37)
Albumin: 4.6 g/dL (ref 3.5–5.2)
Alkaline Phosphatase: 68 U/L (ref 39–117)
Bilirubin, Direct: 0.1 mg/dL (ref 0.0–0.3)
Total Bilirubin: 0.7 mg/dL (ref 0.2–1.2)
Total Protein: 7.2 g/dL (ref 6.0–8.3)

## 2023-04-24 LAB — CK: Total CK: 209 U/L — ABNORMAL HIGH (ref 7–177)

## 2023-04-24 LAB — HEMOGLOBIN A1C: Hgb A1c MFr Bld: 5.9 % (ref 4.6–6.5)

## 2023-04-24 MED ORDER — TRIAMTERENE-HCTZ 37.5-25 MG PO TABS: 1.0000 | ORAL_TABLET | Freq: Every day | ORAL | 1 refills | Status: DC

## 2023-04-24 MED ORDER — AMLODIPINE BESYLATE 10 MG PO TABS: 10.0000 mg | ORAL_TABLET | Freq: Every day | ORAL | 1 refills | Status: DC

## 2023-04-24 NOTE — Patient Instructions (Signed)

## 2023-04-24 NOTE — Progress Notes (Addendum)
 Subjective:  Patient ID: [name], female    DOB: 16-Mar-1946  Age: 78 y.o. MRN: [id number]  CC: Annual Exam, Hypertension, and Hyperlipidemia   HPI [name] presents for a CPX and f/up  ---  Discussed the use of AI scribe software for clinical note transcription with the patient, who gave verbal consent to proceed.  History of Present Illness   The patient, with a known history of hypertension and hyperlipidemia, reports a significant life event of her mother's passing in November. She acknowledges a weight gain of approximately 10 pounds since then, attributing it to the emotional stress and holiday season. She denies any symptoms of headache, blurred vision, chest pain, shortness of breath, dizziness, lightheadedness, or edema. She also denies any symptoms suggestive of blood pressure fluctuations.  Regarding her hyperlipidemia, she denies any muscle aches from her cholesterol medication. She reports increased joint stiffness, which she manages with daily collagen supplementation and occasional use of Tylenol  for arthritis. She denies regular use of any nephrotoxic medications such as ibuprofen, Aleve, or Motrin.  The patient also has a known aortic aneurysm, identified last spring, and expresses curiosity about the need for reevaluation. She also brings up a concern about a change in one of her toenails, suspecting a possible fungal infection.       Outpatient Medications Prior to Visit  Medication Sig Dispense Refill   atorvastatin  (LIPITOR) 40 MG tablet TAKE 1 TABLET BY MOUTH EVERY DAY 90 tablet 1   amLODipine  (NORVASC ) 10 MG tablet Take 1 tablet (10 mg total) by mouth daily. 90 tablet 0   triamterene -hydrochlorothiazide (MAXZIDE-25) 37.5-25 MG tablet Take 1 tablet by mouth daily. 30 tablet 0   No facility-administered medications prior to visit.    ROS Review of Systems  Constitutional: Negative.  Negative for appetite change, fatigue and unexpected  weight change.  HENT: Negative.    Eyes: Negative.   Respiratory: Negative.  Negative for cough, chest tightness, shortness of breath and wheezing.   Cardiovascular:  Negative for chest pain, palpitations and leg swelling.  Gastrointestinal:  Negative for abdominal pain, constipation, diarrhea, nausea and vomiting.  Endocrine: Negative.   Genitourinary: Negative.  Negative for difficulty urinating.  Musculoskeletal:  Positive for arthralgias. Negative for back pain and myalgias.  Skin: Negative.   Neurological:  Negative for dizziness, weakness and headaches.  Hematological:  Negative for adenopathy. Does not bruise/bleed easily.  Psychiatric/Behavioral: Negative.      Objective:  BP 138/78 (BP Location: Right Arm, Patient Position: Sitting, Cuff Size: Normal)   Pulse 77   Temp 98.4 F (36.9 C) (Oral)   Resp 16   Ht 5' 11 (1.803 m)   Wt 216 lb (98 kg)   SpO2 98%   BMI 30.13 kg/m   BP Readings from Last 3 Encounters:  04/24/23 138/78  08/23/22 132/78  05/04/22 118/60    Wt Readings from Last 3 Encounters:  04/24/23 216 lb (98 kg)  08/23/22 211 lb (95.7 kg)  05/04/22 218 lb 9.6 oz (99.2 kg)    Physical Exam Vitals reviewed.  Constitutional:      Appearance: Normal appearance.  HENT:     Nose: Nose normal.     Mouth/Throat:     Mouth: Mucous membranes are moist.  Eyes:     General: No scleral icterus.    Conjunctiva/sclera: Conjunctivae normal.  Cardiovascular:     Rate and Rhythm: Normal rate and regular rhythm.     Heart sounds: Normal  heart sounds, S1 normal and S2 normal.     No gallop.     Comments: EKG- NSR, 67 bpm No LVH, Q waves, or ST/T wave changes  Pulmonary:     Effort: Pulmonary effort is normal.     Breath sounds: No stridor. No wheezing, rhonchi or rales.  Abdominal:     General: Abdomen is flat.     Palpations: There is no mass.     Tenderness: There is no abdominal tenderness. There is no guarding.     Hernia: No hernia is present.   Musculoskeletal:     Cervical back: Neck supple.     Right lower leg: No edema.     Left lower leg: No edema.  Skin:    General: Skin is warm and dry.  Neurological:     General: No focal deficit present.     Mental Status: She is alert. Mental status is at baseline.  Psychiatric:        Mood and Affect: Mood normal.        Behavior: Behavior normal.     Lab Results  Component Value Date   WBC 5.4 04/24/2023   HGB 13.5 04/24/2023   HCT 39.6 04/24/2023   PLT 342.0 04/24/2023   GLUCOSE 97 04/24/2023   CHOL 151 04/24/2023   TRIG 134.0 04/24/2023   HDL 52.20 04/24/2023   LDLDIRECT 93.0 10/27/2021   LDLCALC 72 04/24/2023   ALT 25 04/24/2023   AST 25 04/24/2023   NA 139 04/24/2023   K 4.3 04/24/2023   CL 102 04/24/2023   CREATININE 0.94 04/24/2023   BUN 18 04/24/2023   CO2 31 04/24/2023   TSH 1.23 04/24/2023   INR 1.4 05/05/2008   HGBA1C 5.9 04/24/2023    No results found.  Assessment & Plan:  Stage 3a chronic kidney disease (HCC)- Her renal function has improved. -     Basic metabolic panel; Future -     CBC with Differential/Platelet; Future -     Urinalysis, Routine w reflex microscopic; Future -     Hepatic function panel; Future  Prediabetes -     Basic metabolic panel; Future -     Hemoglobin A1c; Future  Pure hypercholesterolemia- LDL goal achieved. Doing well on the statin  -     Hepatic function panel; Future -     Lipid panel; Future -     CK; Future -     TSH; Future  Encounter for general adult medical examination with abnormal findings- Exam completed, labs reviewed, vaccines reviewed, cancer screenings are UTD, pt ed material was given.   Essential hypertension, benign- EKG is negative for LVH.  Blood pressure is adequately well-controlled. -     TSH; Future -     EKG 12-Lead -     amLODIPine  Besylate; Take 1 tablet (10 mg total) by mouth daily.  Dispense: 90 tablet; Refill: 1 -     Triamterene -HCTZ; Take 1 tablet by mouth daily.  Dispense:  90 tablet; Refill: 1  Onychomycosis due to dermatophyte -     Ambulatory referral to Podiatry  Aneurysm of ascending aorta without rupture (HCC) -     CT ANGIO CHEST AORTA W/CM & OR WO/CM; Future     Follow-up: Return in about 6 months (around 10/22/2023).  Debby Molt, MD

## 2023-04-27 ENCOUNTER — Encounter: Payer: Self-pay | Admitting: Internal Medicine

## 2023-05-16 ENCOUNTER — Ambulatory Visit
Admission: RE | Admit: 2023-05-16 | Discharge: 2023-05-16 | Disposition: A | Discharge status: Home / Self Care | Payer: Medicare Other | Source: Ambulatory Visit | Attending: Internal Medicine | Admitting: Internal Medicine

## 2023-05-16 ENCOUNTER — Ambulatory Visit: Payer: Medicare Other | Admitting: Podiatry

## 2023-05-16 ENCOUNTER — Encounter: Payer: Self-pay | Admitting: Podiatry

## 2023-05-16 DIAGNOSIS — M21619 Bunion of unspecified foot: Secondary | ICD-10-CM

## 2023-05-16 DIAGNOSIS — I7121 Aneurysm of the ascending aorta, without rupture: Secondary | ICD-10-CM | POA: Diagnosis not present

## 2023-05-16 DIAGNOSIS — B351 Tinea unguium: Secondary | ICD-10-CM | POA: Diagnosis not present

## 2023-05-16 MED ORDER — TERBINAFINE HCL 250 MG PO TABS: 250.0000 mg | ORAL_TABLET | Freq: Every day | ORAL | 0 refills | Status: DC

## 2023-05-16 MED ORDER — IOPAMIDOL (ISOVUE-370) INJECTION 76%
500.0000 mL | Freq: Once | INTRAVENOUS | Status: AC | PRN
  Administered 2023-05-16: 75 mL via INTRAVENOUS

## 2023-05-16 NOTE — Progress Notes (Signed)
 Subjective:   Patient ID: [name], female   DOB: 78 y.o.   MRN: [id number]   HPI Patient presents with very thickened right big toenail that she does not remember injury and states that she is very worried about fungus.  States it is not bothersome but it is slightly loose and has been present for most likely several months with patient having had surgery of for bunion correction and does not smoke likes to be active   Review of Systems  All other systems reviewed and are negative.       Objective:  Physical Exam Vitals and nursing note reviewed.  Constitutional:      Appearance: She is well-developed.  Pulmonary:     Effort: Pulmonary effort is normal.  Musculoskeletal:        General: Normal range of motion.  Skin:    General: Skin is warm.  Neurological:     Mental Status: She is alert.     Neurovascular status intact muscle strength adequate range of motion adequate with patient noted to have deformed dystrophic yellowed right hallux nailbed involving the entire nail slight looseness to the bed with good digital perfusion well-oriented     Assessment:  Appears to be mycotic infection right big toenail also may be trauma to the underlying nailbed     Plan:  H&P reviewed at great length and discussed.  We discussed different treatment options including oral medicines laser topical and removal.  At this point after extensive review we have decided on combination of oral and laser and I am only gena do 45 days of oral and instructed on this for her.  She wants to do this she is written prescription and she is scheduled for laser surgery understanding no guarantee this will solve the problem

## 2023-05-18 ENCOUNTER — Other Ambulatory Visit: Payer: Self-pay | Admitting: Internal Medicine

## 2023-05-18 ENCOUNTER — Encounter: Payer: Self-pay | Admitting: Internal Medicine

## 2023-05-18 DIAGNOSIS — E079 Disorder of thyroid, unspecified: Secondary | ICD-10-CM | POA: Insufficient documentation

## 2023-05-21 ENCOUNTER — Ambulatory Visit
Admission: RE | Admit: 2023-05-21 | Discharge: 2023-05-21 | Disposition: A | Discharge status: Home / Self Care | Payer: Medicare Other | Source: Ambulatory Visit | Attending: Internal Medicine | Admitting: Internal Medicine

## 2023-05-21 DIAGNOSIS — E079 Disorder of thyroid, unspecified: Secondary | ICD-10-CM

## 2023-05-21 DIAGNOSIS — E042 Nontoxic multinodular goiter: Secondary | ICD-10-CM | POA: Diagnosis not present

## 2023-05-22 ENCOUNTER — Encounter: Payer: Self-pay | Admitting: Internal Medicine

## 2023-06-03 ENCOUNTER — Other Ambulatory Visit: Payer: Self-pay | Admitting: Podiatry

## 2023-06-06 ENCOUNTER — Other Ambulatory Visit: Payer: Self-pay | Admitting: Podiatry

## 2023-06-07 NOTE — Telephone Encounter (Signed)
 Would prefer not to refill at this time

## 2023-06-08 ENCOUNTER — Encounter: Payer: Self-pay | Admitting: Internal Medicine

## 2023-06-08 ENCOUNTER — Other Ambulatory Visit: Payer: Self-pay

## 2023-06-08 DIAGNOSIS — E7800 Pure hypercholesterolemia, unspecified: Secondary | ICD-10-CM

## 2023-06-08 MED ORDER — ATORVASTATIN CALCIUM 40 MG PO TABS: 40.0000 mg | ORAL_TABLET | Freq: Every day | ORAL | 1 refills | Status: DC

## 2023-06-11 NOTE — Telephone Encounter (Signed)
 Tell her we don't refill this prescription and that it will still work even after finishing

## 2023-07-12 DIAGNOSIS — C678 Malignant neoplasm of overlapping sites of bladder: Secondary | ICD-10-CM | POA: Diagnosis not present

## 2023-07-13 ENCOUNTER — Ambulatory Visit (INDEPENDENT_AMBULATORY_CARE_PROVIDER_SITE_OTHER): Payer: Medicare Other

## 2023-07-13 DIAGNOSIS — B351 Tinea unguium: Secondary | ICD-10-CM

## 2023-07-13 NOTE — Progress Notes (Signed)
 Patient presents today for the 1st laser treatment. Diagnosed with mycotic nail infection by Dr. Charlsie Merles.   Toenail most affected right 1st hallux.  All other systems are negative.  Nails were filed thin. Laser therapy was administered to 1st toenail right side and patient tolerated the treatment well. All safety precautions were in place.   Patinet has completed Lamisil oral treatment 2 weeks ago.   She will come every 6 weeks for 3 treatments then see the provider.   Follow up in 6 weeks for laser # 2.

## 2023-07-13 NOTE — Patient Instructions (Signed)

## 2023-07-17 DIAGNOSIS — C679 Malignant neoplasm of bladder, unspecified: Secondary | ICD-10-CM | POA: Diagnosis not present

## 2023-07-17 DIAGNOSIS — C678 Malignant neoplasm of overlapping sites of bladder: Secondary | ICD-10-CM | POA: Diagnosis not present

## 2023-07-17 DIAGNOSIS — R9389 Abnormal findings on diagnostic imaging of other specified body structures: Secondary | ICD-10-CM | POA: Diagnosis not present

## 2023-07-17 DIAGNOSIS — K7689 Other specified diseases of liver: Secondary | ICD-10-CM | POA: Diagnosis not present

## 2023-08-02 ENCOUNTER — Encounter: Payer: Self-pay | Admitting: Internal Medicine

## 2023-08-02 ENCOUNTER — Other Ambulatory Visit: Payer: Self-pay | Admitting: Podiatry

## 2023-08-02 DIAGNOSIS — C678 Malignant neoplasm of overlapping sites of bladder: Secondary | ICD-10-CM | POA: Diagnosis not present

## 2023-09-21 ENCOUNTER — Ambulatory Visit (INDEPENDENT_AMBULATORY_CARE_PROVIDER_SITE_OTHER): Admitting: *Deleted

## 2023-09-21 DIAGNOSIS — B351 Tinea unguium: Secondary | ICD-10-CM

## 2023-09-21 NOTE — Progress Notes (Signed)
 Patient presents today for the 2nd laser treatment. Diagnosed with mycotic nail infection by Dr. Celia Coles.   Toenail most affected hallux right  All other systems are negative.  Nails were filed thin. Laser therapy was administered to 1st toenail right side and patient tolerated the treatment well. All safety precautions were in place.   Patient has completed Lamisil  oral treatment   Patient would like to wait on doing her last laser. She feels the nail is growing out nicely and would like to see if it continues. Advised if she sees any regression, she should schedule an appointment.

## 2023-10-11 ENCOUNTER — Encounter: Payer: Self-pay | Admitting: Dermatology

## 2023-10-11 ENCOUNTER — Ambulatory Visit: Admitting: Dermatology

## 2023-10-11 VITALS — BP 138/78

## 2023-10-11 DIAGNOSIS — L578 Other skin changes due to chronic exposure to nonionizing radiation: Secondary | ICD-10-CM | POA: Diagnosis not present

## 2023-10-11 DIAGNOSIS — D1801 Hemangioma of skin and subcutaneous tissue: Secondary | ICD-10-CM

## 2023-10-11 DIAGNOSIS — Z808 Family history of malignant neoplasm of other organs or systems: Secondary | ICD-10-CM | POA: Diagnosis not present

## 2023-10-11 DIAGNOSIS — L821 Other seborrheic keratosis: Secondary | ICD-10-CM | POA: Diagnosis not present

## 2023-10-11 DIAGNOSIS — L918 Other hypertrophic disorders of the skin: Secondary | ICD-10-CM | POA: Diagnosis not present

## 2023-10-11 DIAGNOSIS — Z1283 Encounter for screening for malignant neoplasm of skin: Secondary | ICD-10-CM | POA: Diagnosis not present

## 2023-10-11 DIAGNOSIS — L814 Other melanin hyperpigmentation: Secondary | ICD-10-CM | POA: Diagnosis not present

## 2023-10-11 DIAGNOSIS — D229 Melanocytic nevi, unspecified: Secondary | ICD-10-CM

## 2023-10-11 DIAGNOSIS — W908XXA Exposure to other nonionizing radiation, initial encounter: Secondary | ICD-10-CM

## 2023-10-11 NOTE — Patient Instructions (Signed)

## 2023-10-11 NOTE — Progress Notes (Signed)
   New Patient Visit   Subjective  [name] is a 78 y.o. female who presents for the following: Skin Cancer Screening and Full Body Skin Exam - no history of skin cancer. Family history of Melanoma - brother  The patient presents for Total-Body Skin Exam (TBSE) for skin cancer screening and mole check. The patient has spots, moles and lesions to be evaluated, some may be new or changing.  The following portions of the chart were reviewed this encounter and updated as appropriate: medications, allergies, medical history  Review of Systems:  No other skin or systemic complaints except as noted in HPI or Assessment and Plan.  Objective  Well appearing patient in no apparent distress; mood and affect are within normal limits.  A full examination was performed including scalp, head, eyes, ears, nose, lips, neck, chest, axillae, abdomen, back, buttocks, bilateral upper extremities, bilateral lower extremities, hands, feet, fingers, toes, fingernails, and toenails. All findings within normal limits unless otherwise noted below.   Relevant physical exam findings are noted in the Assessment and Plan.    Assessment & Plan   SKIN CANCER SCREENING PERFORMED TODAY.  ACTINIC DAMAGE - Chronic condition, secondary to cumulative UV/sun exposure - diffuse scaly erythematous macules with underlying dyspigmentation - Recommend daily broad spectrum sunscreen SPF 30+ to sun-exposed areas, reapply every 2 hours as needed.  - Staying in the shade or wearing long sleeves, sun glasses (UVA+UVB protection) and wide brim hats (4-inch brim around the entire circumference of the hat) are also recommended for sun protection.  - Call for new or changing lesions.  LENTIGINES, SEBORRHEIC KERATOSES, HEMANGIOMAS - Benign normal skin lesions - Benign-appearing - Call for any changes  MELANOCYTIC NEVI - Tan-brown and/or pink-flesh-colored symmetric macules and papules - Benign appearing on exam today -  Observation - Call clinic for new or changing moles - Recommend daily use of broad spectrum spf 30+ sunscreen to sun-exposed areas.   FAMILY HISTORY OF SKIN CANCER What type(s): Melanoma Who affected: Brother  Acrochordons (Skin Tags) - Fleshy, skin-colored pedunculated papules - Benign appearing.  - Observe. - If desired, they can be removed with an in office procedure that is not covered by insurance. - Please call the clinic if you notice any new or changing lesions.     Return in about 1 year (around 10/10/2024) for TBSE.  I, Roseline Hutchinson, CMA, am acting as scribe for RUFUS CHRISTELLA HOLY, MD .   Documentation: I have reviewed the above documentation for accuracy and completeness, and I agree with the above.  RUFUS CHRISTELLA HOLY, MD

## 2023-11-02 ENCOUNTER — Other Ambulatory Visit

## 2023-11-09 ENCOUNTER — Other Ambulatory Visit: Payer: Self-pay | Admitting: Internal Medicine

## 2023-11-09 DIAGNOSIS — I10 Essential (primary) hypertension: Secondary | ICD-10-CM

## 2023-11-13 NOTE — Telephone Encounter (Signed)
 Last OV 04/24/23 Next OV 04/14/24  Last refill 04/24/23 Qty #90/1

## 2023-11-16 ENCOUNTER — Other Ambulatory Visit: Payer: Self-pay | Admitting: Internal Medicine

## 2023-11-16 DIAGNOSIS — E7800 Pure hypercholesterolemia, unspecified: Secondary | ICD-10-CM

## 2023-11-21 ENCOUNTER — Other Ambulatory Visit: Payer: Self-pay | Admitting: Internal Medicine

## 2023-11-21 DIAGNOSIS — I10 Essential (primary) hypertension: Secondary | ICD-10-CM

## 2023-12-02 ENCOUNTER — Encounter: Payer: Self-pay | Admitting: Internal Medicine

## 2023-12-03 DIAGNOSIS — Z1231 Encounter for screening mammogram for malignant neoplasm of breast: Secondary | ICD-10-CM | POA: Diagnosis not present

## 2023-12-03 LAB — HM MAMMOGRAPHY

## 2023-12-04 ENCOUNTER — Other Ambulatory Visit: Payer: Self-pay

## 2023-12-04 DIAGNOSIS — I10 Essential (primary) hypertension: Secondary | ICD-10-CM

## 2023-12-04 MED ORDER — AMLODIPINE BESYLATE 10 MG PO TABS: 10.0000 mg | ORAL_TABLET | Freq: Every day | ORAL | 0 refills | Status: DC

## 2023-12-06 ENCOUNTER — Encounter: Payer: Self-pay | Admitting: Internal Medicine

## 2023-12-27 ENCOUNTER — Other Ambulatory Visit: Payer: Self-pay | Admitting: Internal Medicine

## 2023-12-27 DIAGNOSIS — E7800 Pure hypercholesterolemia, unspecified: Secondary | ICD-10-CM

## 2023-12-30 ENCOUNTER — Other Ambulatory Visit: Payer: Self-pay | Admitting: Internal Medicine

## 2023-12-30 DIAGNOSIS — I10 Essential (primary) hypertension: Secondary | ICD-10-CM

## 2024-01-01 ENCOUNTER — Encounter: Payer: Self-pay | Admitting: Internal Medicine

## 2024-01-01 ENCOUNTER — Ambulatory Visit (INDEPENDENT_AMBULATORY_CARE_PROVIDER_SITE_OTHER): Admitting: Internal Medicine

## 2024-01-01 VITALS — BP 138/84 | HR 77 | Temp 97.9°F | Resp 16 | Ht 71.0 in | Wt 209.4 lb

## 2024-01-01 DIAGNOSIS — R8281 Pyuria: Secondary | ICD-10-CM | POA: Diagnosis not present

## 2024-01-01 DIAGNOSIS — E7800 Pure hypercholesterolemia, unspecified: Secondary | ICD-10-CM | POA: Diagnosis not present

## 2024-01-01 DIAGNOSIS — I10 Essential (primary) hypertension: Secondary | ICD-10-CM

## 2024-01-01 DIAGNOSIS — R7303 Prediabetes: Secondary | ICD-10-CM | POA: Diagnosis not present

## 2024-01-01 LAB — CBC WITH DIFFERENTIAL/PLATELET
Basophils Absolute: 0.1 K/uL (ref 0.0–0.1)
Basophils Relative: 1.1 % (ref 0.0–3.0)
Eosinophils Absolute: 0.3 K/uL (ref 0.0–0.7)
Eosinophils Relative: 4 % (ref 0.0–5.0)
HCT: 39.9 % (ref 36.0–46.0)
Hemoglobin: 13.5 g/dL (ref 12.0–15.0)
Lymphocytes Relative: 33.2 % (ref 12.0–46.0)
Lymphs Abs: 2.3 K/uL (ref 0.7–4.0)
MCHC: 33.9 g/dL (ref 30.0–36.0)
MCV: 91.6 fl (ref 78.0–100.0)
Monocytes Absolute: 0.6 K/uL (ref 0.1–1.0)
Monocytes Relative: 8.6 % (ref 3.0–12.0)
Neutro Abs: 3.6 K/uL (ref 1.4–7.7)
Neutrophils Relative %: 53.1 % (ref 43.0–77.0)
Platelets: 350 K/uL (ref 150.0–400.0)
RBC: 4.35 Mil/uL (ref 3.87–5.11)
RDW: 13.4 % (ref 11.5–15.5)
WBC: 6.9 K/uL (ref 4.0–10.5)

## 2024-01-01 LAB — HEPATIC FUNCTION PANEL
ALT: 25 U/L (ref 0–35)
AST: 21 U/L (ref 0–37)
Albumin: 4.7 g/dL (ref 3.5–5.2)
Alkaline Phosphatase: 65 U/L (ref 39–117)
Bilirubin, Direct: 0.1 mg/dL (ref 0.0–0.3)
Total Bilirubin: 0.4 mg/dL (ref 0.2–1.2)
Total Protein: 7.3 g/dL (ref 6.0–8.3)

## 2024-01-01 LAB — BASIC METABOLIC PANEL WITH GFR
BUN: 20 mg/dL (ref 6–23)
CO2: 27 meq/L (ref 19–32)
Calcium: 9.8 mg/dL (ref 8.4–10.5)
Chloride: 101 meq/L (ref 96–112)
Creatinine, Ser: 0.98 mg/dL (ref 0.40–1.20)
GFR: 56.17 mL/min — ABNORMAL LOW (ref >60.00)
Glucose, Bld: 95 mg/dL (ref 70–99)
Potassium: 3.5 meq/L (ref 3.5–5.1)
Sodium: 137 meq/L (ref 135–145)

## 2024-01-01 LAB — TSH: TSH: 1.33 u[IU]/mL (ref 0.35–5.50)

## 2024-01-01 NOTE — Progress Notes (Signed)
 "  Subjective:  Patient ID: [name], female    DOB: Nov 10, 1945  Age: 78 y.o. MRN: [id number]  CC: Hypertension and Hyperlipidemia   HPI [name] presents for f/up ---  Discussed the use of AI scribe software for clinical note transcription with the patient, who gave verbal consent to proceed.  History of Present Illness [name] is a 78 year old female who presents for a routine follow-up visit.  She feels well and remains active without experiencing chest pain, shortness of breath, dizziness, or lightheadedness. No side effects from her blood pressure medication. No headaches, blurred vision, or swelling in her legs or feet, except for a slight spongy feeling around her feet after long flights.  She is up to date with her flu and COVID vaccinations and plans to receive her shingles vaccine soon. No muscle or joint aches from her statin medication and no dizziness or lightheadedness from her blood pressure medication.  She recently visited a dermatologist for a general skin check, which was normal. She traveled to [place] with a friend to visit an old high school friend and reports feeling great and comfortable in her own body.  She had an EKG in January. She is aware of a previously noted aortic aneurysm that is smaller than before.     Outpatient Medications Prior to Visit  Medication Sig Dispense Refill   triamterene -hydrochlorothiazide (MAXZIDE-25) 37.5-25 MG tablet TAKE 1 TABLET BY MOUTH EVERY DAY 90 tablet 1   amLODipine  (NORVASC ) 10 MG tablet Take 1 tablet (10 mg total) by mouth daily. Please keep upcoming appointment with Dr. Joshua for further refills. 28 tablet 0   atorvastatin  (LIPITOR) 40 MG tablet Take 1 tablet (40 mg total) by mouth daily. NEEDS APPOINTMENT FOR REFILLS 30 tablet 0   terbinafine  (LAMISIL ) 250 MG tablet Take 1 tablet (250 mg total) by mouth daily. 45 tablet 0   No facility-administered medications prior to visit.     ROS Review of Systems  Constitutional:  Negative for appetite change, chills, diaphoresis, fatigue and fever.  HENT: Negative.    Eyes: Negative.  Negative for visual disturbance.  Respiratory:  Negative for cough, chest tightness, shortness of breath and wheezing.   Cardiovascular:  Negative for chest pain, palpitations and leg swelling.  Gastrointestinal: Negative.  Negative for abdominal pain, constipation, diarrhea, nausea and vomiting.  Genitourinary: Negative.  Negative for decreased urine volume, difficulty urinating, dysuria, flank pain, frequency, hematuria and urgency.  Musculoskeletal: Negative.   Skin: Negative.   Neurological: Negative.  Negative for dizziness and weakness.  Hematological:  Negative for adenopathy. Does not bruise/bleed easily.  Psychiatric/Behavioral: Negative.      Objective:  BP 138/84 (BP Location: Left Arm, Patient Position: Sitting, Cuff Size: Normal)   Pulse 77   Temp 97.9 F (36.6 C) (Oral)   Resp 16   Ht 5' 11 (1.803 m)   Wt 209 lb 6.4 oz (95 kg)   SpO2 95%   BMI 29.21 kg/m   BP Readings from Last 3 Encounters:  01/01/24 138/84  10/11/23 138/78  04/24/23 138/78    Wt Readings from Last 3 Encounters:  01/01/24 209 lb 6.4 oz (95 kg)  04/24/23 216 lb (98 kg)  08/23/22 211 lb (95.7 kg)    Physical Exam Vitals reviewed.  Constitutional:      Appearance: Normal appearance.  HENT:     Nose: Nose normal.     Mouth/Throat:     Mouth: Mucous membranes are moist.  Eyes:     General: No scleral icterus.    Conjunctiva/sclera: Conjunctivae normal.  Cardiovascular:     Rate and Rhythm: Normal rate and regular rhythm.     Pulses: Normal pulses.     Heart sounds: No murmur heard.    No friction rub. No gallop.  Pulmonary:     Effort: Pulmonary effort is normal.     Breath sounds: No stridor. No wheezing, rhonchi or rales.  Abdominal:     General: Abdomen is flat.     Palpations: There is no mass.     Tenderness: There is no  abdominal tenderness. There is no guarding.     Hernia: No hernia is present.  Musculoskeletal:        General: Normal range of motion.     Cervical back: Neck supple.     Right lower leg: No edema.     Left lower leg: No edema.  Lymphadenopathy:     Cervical: No cervical adenopathy.  Skin:    General: Skin is warm and dry.  Neurological:     General: No focal deficit present.     Mental Status: She is alert and oriented to person, place, and time.  Psychiatric:        Mood and Affect: Mood normal.        Behavior: Behavior normal.     Lab Results  Component Value Date   WBC 6.9 01/01/2024   HGB 13.5 01/01/2024   HCT 39.9 01/01/2024   PLT 350.0 01/01/2024   GLUCOSE 95 01/01/2024   CHOL 151 04/24/2023   TRIG 134.0 04/24/2023   HDL 52.20 04/24/2023   LDLDIRECT 93.0 10/27/2021   LDLCALC 72 04/24/2023   ALT 25 01/01/2024   AST 21 01/01/2024   NA 137 01/01/2024   K 3.5 01/01/2024   CL 101 01/01/2024   CREATININE 0.98 01/01/2024   BUN 20 01/01/2024   CO2 27 01/01/2024   TSH 1.33 01/01/2024   INR 1.4 05/05/2008   HGBA1C 6.2 01/01/2024    US  THYROID  Result Date: 05/21/2023 CLINICAL DATA:  Incidental on CT. Left-sided thyroid  nodule noted on chest CT EXAM: THYROID  ULTRASOUND TECHNIQUE: Ultrasound examination of the thyroid  gland and adjacent soft tissues was performed. COMPARISON:  Chest CT-05/16/2023 FINDINGS: Parenchymal Echotexture: Normal Isthmus: Normal in size measuring 0.8 cm in diameter Right lobe: Normal in size measuring 4.0 x 1.7 x 1.5 cm Left lobe: Normal in size measuring 4.6 x 2.7 x 2.6 cm _________________________________________________________ Estimated total number of nodules >/= 1 cm: 1 Number of spongiform nodules >/=  2 cm not described below (TR1): 0 Number of mixed cystic and solid nodules >/= 1.5 cm not described below (TR2): 0 _________________________________________________________ There is a punctate (0.7 cm) isoechoic nodule within mid aspect of the  right lobe of the thyroid  (labeled 1), which does not meet criteria to recommend percutaneous sampling or continued dedicated follow-up. _________________________________________________________ There is a punctate (0.8 x 0.8 x 0.4 cm hypo/isoechoic nodule within the mid, medial aspect of the left lobe of the thyroid  (labeled 2), which does not meet criteria to recommend percutaneous sampling or continued dedicated follow-up. _________________________________________________________ Nodule # 3: Location: Left; Mid - correlates with the nodule questioned on preceding chest CT Maximum size: 2.8 cm; Other 2 dimensions: 2.2 x 2.0 cm Composition: mixed cystic and solid (1) Echogenicity: isoechoic (1) Shape: not taller-than-wide (0) Margins: ill-defined (0) Echogenic foci: large comet-tail artifacts (0) ACR TI-RADS total points: 2. ACR TI-RADS risk category: TR2 (2 points).  ACR TI-RADS recommendations: This nodule does NOT meet TI-RADS criteria for biopsy or dedicated follow-up. _________________________________________________________ IMPRESSION: 1. Findings suggestive of multinodular goiter. 2. None of the discretely measured thyroid  nodules/cysts, including the dominant 2.8 cm mixed cystic and solid benign colloid containing nodule within left lobe of the thyroid , correlating with the nodule seen on preceding chest CT, meet imaging criteria to recommend percutaneous sampling or continued dedicated follow-up. The above is in keeping with the ACR TI-RADS recommendations - J Am Coll Radiol 2017;14:587-595. Electronically Signed   By: Norleen Roulette M.D.   On: 05/21/2023 17:24    Assessment & Plan:   Essential hypertension, benign- BP is well controlled. Lytes and renal function are normal. -     Basic metabolic panel with GFR; Future -     CBC with Differential/Platelet; Future -     Hepatic function panel; Future -     Urinalysis, Routine w reflex microscopic; Future -     TSH; Future -     amLODIPine  Besylate;  Take 1 tablet (10 mg total) by mouth daily.  Dispense: 90 tablet; Refill: 1  Prediabetes -     Basic metabolic panel with GFR; Future -     Hemoglobin A1c; Future  Pure hypercholesterolemia- LDL goal achieved. Doing well on the statin  -     Hepatic function panel; Future -     TSH; Future  Pyuria- She is asx. -     CULTURE, URINE COMPREHENSIVE; Future     Follow-up: Return in about 6 months (around 06/30/2024).  Debby Molt, MD "

## 2024-01-01 NOTE — Patient Instructions (Signed)
 Hypertension, Adult High blood pressure (hypertension) is when the force of blood pumping through the arteries is too strong. The arteries are the blood vessels that carry blood from the heart throughout the body. Hypertension forces the heart to work harder to pump blood and may cause arteries to become narrow or stiff. Untreated or uncontrolled hypertension can lead to a heart attack, heart failure, a stroke, kidney disease, and other problems. A blood pressure reading consists of a higher number over a lower number. Ideally, your blood pressure should be below 120/80. The first ("top") number is called the systolic pressure. It is a measure of the pressure in your arteries as your heart beats. The second ("bottom") number is called the diastolic pressure. It is a measure of the pressure in your arteries as the heart relaxes. What are the causes? The exact cause of this condition is not known. There are some conditions that result in high blood pressure. What increases the risk? Certain factors may make you more likely to develop high blood pressure. Some of these risk factors are under your control, including: Smoking. Not getting enough exercise or physical activity. Being overweight. Having too much fat, sugar, calories, or salt (sodium) in your diet. Drinking too much alcohol. Other risk factors include: Having a personal history of heart disease, diabetes, high cholesterol, or kidney disease. Stress. Having a family history of high blood pressure and high cholesterol. Having obstructive sleep apnea. Age. The risk increases with age. What are the signs or symptoms? High blood pressure may not cause symptoms. Very high blood pressure (hypertensive crisis) may cause: Headache. Fast or irregular heartbeats (palpitations). Shortness of breath. Nosebleed. Nausea and vomiting. Vision changes. Severe chest pain, dizziness, and seizures. How is this diagnosed? This condition is diagnosed by  measuring your blood pressure while you are seated, with your arm resting on a flat surface, your legs uncrossed, and your feet flat on the floor. The cuff of the blood pressure monitor will be placed directly against the skin of your upper arm at the level of your heart. Blood pressure should be measured at least twice using the same arm. Certain conditions can cause a difference in blood pressure between your right and left arms. If you have a high blood pressure reading during one visit or you have normal blood pressure with other risk factors, you may be asked to: Return on a different day to have your blood pressure checked again. Monitor your blood pressure at home for 1 week or longer. If you are diagnosed with hypertension, you may have other blood or imaging tests to help your health care provider understand your overall risk for other conditions. How is this treated? This condition is treated by making healthy lifestyle changes, such as eating healthy foods, exercising more, and reducing your alcohol intake. You may be referred for counseling on a healthy diet and physical activity. Your health care provider may prescribe medicine if lifestyle changes are not enough to get your blood pressure under control and if: Your systolic blood pressure is above 130. Your diastolic blood pressure is above 80. Your personal target blood pressure may vary depending on your medical conditions, your age, and other factors. Follow these instructions at home: Eating and drinking  Eat a diet that is high in fiber and potassium, and low in sodium, added sugar, and fat. An example of this eating plan is called the DASH diet. DASH stands for Dietary Approaches to Stop Hypertension. To eat this way: Eat  plenty of fresh fruits and vegetables. Try to fill one half of your plate at each meal with fruits and vegetables. Eat whole grains, such as whole-wheat pasta, brown rice, or whole-grain bread. Fill about one  fourth of your plate with whole grains. Eat or drink low-fat dairy products, such as skim milk or low-fat yogurt. Avoid fatty cuts of meat, processed or cured meats, and poultry with skin. Fill about one fourth of your plate with lean proteins, such as fish, chicken without skin, beans, eggs, or tofu. Avoid pre-made and processed foods. These tend to be higher in sodium, added sugar, and fat. Reduce your daily sodium intake. Many people with hypertension should eat less than 1,500 mg of sodium a day. Do not drink alcohol if: Your health care provider tells you not to drink. You are pregnant, may be pregnant, or are planning to become pregnant. If you drink alcohol: Limit how much you have to: 0-1 drink a day for women. 0-2 drinks a day for men. Know how much alcohol is in your drink. In the [place] one drink equals one 12 oz bottle of beer (355 mL), one 5 oz glass of wine (148 mL), or one 1 oz glass of hard liquor (44 mL). Lifestyle  Work with your health care provider to maintain a healthy body weight or to lose weight. Ask what an ideal weight is for you. Get at least 30 minutes of exercise that causes your heart to beat faster (aerobic exercise) most days of the week. Activities may include walking, swimming, or biking. Include exercise to strengthen your muscles (resistance exercise), such as Pilates or lifting weights, as part of your weekly exercise routine. Try to do these types of exercises for 30 minutes at least 3 days a week. Do not use any products that contain nicotine or tobacco. These products include cigarettes, chewing tobacco, and vaping devices, such as e-cigarettes. If you need help quitting, ask your health care provider. Monitor your blood pressure at home as told by your health care provider. Keep all follow-up visits. This is important. Medicines Take over-the-counter and prescription medicines only as told by your health care provider. Follow directions carefully. Blood  pressure medicines must be taken as prescribed. Do not skip doses of blood pressure medicine. Doing this puts you at risk for problems and can make the medicine less effective. Ask your health care provider about side effects or reactions to medicines that you should watch for. Contact a health care provider if you: Think you are having a reaction to a medicine you are taking. Have headaches that keep coming back (recurring). Feel dizzy. Have swelling in your ankles. Have trouble with your vision. Get help right away if you: Develop a severe headache or confusion. Have unusual weakness or numbness. Feel faint. Have severe pain in your chest or abdomen. Vomit repeatedly. Have trouble breathing. These symptoms may be an emergency. Get help right away. Call 911. Do not wait to see if the symptoms will go away. Do not drive yourself to the hospital. Summary Hypertension is when the force of blood pumping through your arteries is too strong. If this condition is not controlled, it may put you at risk for serious complications. Your personal target blood pressure may vary depending on your medical conditions, your age, and other factors. For most people, a normal blood pressure is less than 120/80. Hypertension is treated with lifestyle changes, medicines, or a combination of both. Lifestyle changes include losing weight, eating a healthy,  low-sodium diet, exercising more, and limiting alcohol. This information is not intended to replace advice given to you by your health care provider. Make sure you discuss any questions you have with your health care provider. Document Revised: 02/01/2021 Document Reviewed: 02/01/2021 Elsevier Patient Education  2024 [employer].

## 2024-01-02 ENCOUNTER — Ambulatory Visit: Payer: Self-pay | Admitting: Internal Medicine

## 2024-01-02 DIAGNOSIS — R8281 Pyuria: Secondary | ICD-10-CM | POA: Insufficient documentation

## 2024-01-02 LAB — URINALYSIS, ROUTINE W REFLEX MICROSCOPIC
Bilirubin Urine: NEGATIVE
Hgb urine dipstick: NEGATIVE
Ketones, ur: NEGATIVE
Leukocytes,Ua: NEGATIVE
Nitrite: POSITIVE — AB
RBC / HPF: NONE SEEN (ref 0–?)
Specific Gravity, Urine: 1.015 (ref 1.000–1.030)
Total Protein, Urine: NEGATIVE
Urine Glucose: NEGATIVE
Urobilinogen, UA: 0.2 (ref 0.0–1.0)
pH: 7 (ref 5.0–8.0)

## 2024-01-02 LAB — HEMOGLOBIN A1C: Hgb A1c MFr Bld: 6.2 % (ref 4.6–6.5)

## 2024-01-04 MED ORDER — AMLODIPINE BESYLATE 10 MG PO TABS: 10.0000 mg | ORAL_TABLET | Freq: Every day | ORAL | 1 refills | Status: AC

## 2024-01-04 MED ORDER — ATORVASTATIN CALCIUM 40 MG PO TABS: 40.0000 mg | ORAL_TABLET | Freq: Every day | ORAL | 1 refills | Status: AC

## 2024-01-05 ENCOUNTER — Other Ambulatory Visit: Payer: Self-pay | Admitting: Internal Medicine

## 2024-01-28 ENCOUNTER — Telehealth: Admitting: Physician Assistant

## 2024-01-28 DIAGNOSIS — B9689 Other specified bacterial agents as the cause of diseases classified elsewhere: Secondary | ICD-10-CM | POA: Diagnosis not present

## 2024-01-28 DIAGNOSIS — J208 Acute bronchitis due to other specified organisms: Secondary | ICD-10-CM | POA: Diagnosis not present

## 2024-01-28 MED ORDER — BENZONATATE 100 MG PO CAPS: 100.0000 mg | ORAL_CAPSULE | Freq: Three times a day (TID) | ORAL | 0 refills | Status: DC | PRN

## 2024-01-28 MED ORDER — AZITHROMYCIN 250 MG PO TABS: ORAL_TABLET | ORAL | 0 refills | Status: AC

## 2024-01-28 NOTE — Progress Notes (Signed)
 We are sorry that you are not feeling well.  Here is how we plan to help!  Based on your presentation I believe you most likely have A cough due to bacteria.  When patients have a fever and a productive cough with a change in color or increased sputum production, we are concerned about bacterial bronchitis.  If left untreated it can progress to pneumonia.  If your symptoms do not improve with your treatment plan it is important that you contact your provider.   I have prescribed Azithromyin 250 mg: two tablets now and then one tablet daily for 4 additonal days    In addition you may use A non-prescription cough medication called Mucinex DM: take 2 tablets every 12 hours. and A prescription cough medication called Tessalon  Perles 100mg . You may take 1-2 capsules every 8 hours as needed for your cough.  From your responses in the eVisit questionnaire you describe inflammation in the upper respiratory tract which is causing a significant cough.  This is commonly called Bronchitis and has four common causes:   Allergies Viral Infections Acid Reflux Bacterial Infection Allergies, viruses and acid reflux are treated by controlling symptoms or eliminating the cause. An example might be a cough caused by taking certain blood pressure medications. You stop the cough by changing the medication. Another example might be a cough caused by acid reflux. Controlling the reflux helps control the cough.     HOME CARE Only take medications as instructed by your medical team. Complete the entire course of an antibiotic. Drink plenty of fluids and get plenty of rest. Avoid close contacts especially the very young and the elderly Cover your mouth if you cough or cough into your sleeve. Always remember to wash your hands A steam or ultrasonic humidifier can help congestion.   GET HELP RIGHT AWAY IF: You develop worsening fever. You become short of breath You cough up blood. Your symptoms persist after you have  completed your treatment plan MAKE SURE YOU  Understand these instructions. Will watch your condition. Will get help right away if you are not doing well or get worse.  Your e-visit answers were reviewed by a board certified advanced clinical practitioner to complete your personal care plan.  Depending on the condition, your plan could have included both over the counter or prescription medications. If there is a problem please reply  once you have received a response from your provider. Your safety is important to us .  If you have drug allergies check your prescription carefully.    You can use MyChart to ask questions about today's visit, request a non-urgent call back, or ask for a work or school excuse for 24 hours related to this e-Visit. If it has been greater than 24 hours you will need to follow up with your provider, or enter a new e-Visit to address those concerns. You will get an e-mail in the next two days asking about your experience.  I hope that your e-visit has been valuable and will speed your recovery. Thank you for using e-visits.   I have spent 5 minutes in review of e-visit questionnaire, review and updating patient chart, medical decision making and response to patient.   Delon CHRISTELLA Dickinson, PA-C

## 2024-02-04 ENCOUNTER — Ambulatory Visit: Admitting: Podiatry

## 2024-04-14 ENCOUNTER — Ambulatory Visit: Payer: Medicare Other

## 2024-04-15 ENCOUNTER — Ambulatory Visit

## 2024-05-06 ENCOUNTER — Ambulatory Visit

## 2024-05-06 ENCOUNTER — Telehealth: Payer: Self-pay

## 2024-05-06 VITALS — Ht 71.0 in | Wt 209.0 lb

## 2024-05-06 DIAGNOSIS — Z0000 Encounter for general adult medical examination without abnormal findings: Secondary | ICD-10-CM

## 2024-05-06 NOTE — Patient Instructions (Addendum)
 Ms. Miner,  Thank you for taking the time for your Medicare Wellness Visit. I appreciate your continued commitment to your health goals. Please review the care plan we discussed, and feel free to reach out if I can assist you further.  Please note that Annual Wellness Visits do not include a physical exam. Some assessments may be limited, especially if the visit was conducted virtually. If needed, we may recommend an in-person follow-up with your provider.  Ongoing Care Seeing your primary care provider every 3 to 6 months helps us  monitor your health and provide consistent, personalized care.   Referrals If a referral was made during today's visit and you haven't received any updates within two weeks, please contact the referred provider directly to check on the status.  Recommended Screenings:  Health Maintenance  Topic Date Due   COVID-19 Vaccine (8 - 2025-26 season) 12/10/2023   Medicare Annual Wellness Visit  04/12/2024   DTaP/Tdap/Td vaccine (2 - Td or Tdap) 09/10/2030   Pneumococcal Vaccine for age over 44  Completed   Flu Shot  Completed   Osteoporosis screening with Bone Density Scan  Completed   Hepatitis C Screening  Completed   Zoster (Shingles) Vaccine  Completed   Meningitis B Vaccine  Aged Out   Breast Cancer Screening  Discontinued   Colon Cancer Screening  Discontinued       05/06/2024   11:54 AM  Advanced Directives  Does Patient Have a Medical Advance Directive? Yes  Type of Estate Agent of [place];Living will  Does patient want to make changes to medical advance directive? Yes (Inpatient - patient requests chaplain consult to change a medical advance directive)  Copy of Healthcare Power of Attorney in Chart? No - copy requested    Vision: Annual vision screenings are recommended for early detection of glaucoma, cataracts, and diabetic retinopathy. These exams can also reveal signs of chronic conditions such as diabetes and high blood  pressure.  Dental: Annual dental screenings help detect early signs of oral cancer, gum disease, and other conditions linked to overall health, including heart disease and diabetes.

## 2024-05-06 NOTE — Telephone Encounter (Signed)
 Was this patient on your schedule for today?  Copied from CRM #[id number]. Topic: Appointments - Scheduling Inquiry for Clinic >> May 06, 2024 11:21 AM Robinson H wrote: Reason for CRM: Patient needs to reschedule her AWV that she was scheduled for today 1/27 at 10:10 am, patient states she was logged into video appointment for over an hour and no one joined. Agent tried rescheduling patient but system on giving dates of 2027 since it thinks her appointment was today.  [place] [phone]

## 2024-05-06 NOTE — Progress Notes (Signed)
 "  Chief Complaint  Patient presents with   Medicare Wellness     Subjective:   [name] is a 79 y.o. female who presents for a Medicare Annual Wellness Visit.  Visit info / Clinical Intake: Medicare Wellness Visit Type:: Subsequent Annual Wellness Visit Persons participating in visit and providing information:: patient Medicare Wellness Visit Mode:: Telephone If telephone:: video declined Since this visit was completed virtually, some vitals may be partially provided or unavailable. Missing vitals are due to the limitations of the virtual format.: Documented vitals are patient reported If Telephone or Video please confirm:: I connected with patient using audio/video enable telemedicine. I verified patient identity with two identifiers, discussed telehealth limitations, and patient agreed to proceed. Patient Location:: Home Provider Location:: Office Interpreter Needed?: No Pre-visit prep was completed: yes AWV questionnaire completed by patient prior to visit?: yes Date:: 05/02/24 Living arrangements:: (!) lives alone Patient's Overall Health Status Rating: very good Typical amount of pain: none Does pain affect daily life?: no Are you currently prescribed opioids?: no  Dietary Habits and Nutritional Risks How many meals a day?: 3 Eats fruit and vegetables daily?: yes Most meals are obtained by: preparing own meals In the last 2 weeks, have you had any of the following?: none Diabetic:: no  Functional Status Activities of Daily Living (to include ambulation/medication): Independent Ambulation: Independent with device- listed below Home Assistive Devices/Equipment: Contact lenses; Eyeglasses Medication Administration: Independent Home Management (perform basic housework or laundry): Independent Manage your own finances?: yes Primary transportation is: driving Concerns about vision?: no *vision screening is required for WTM* Concerns about hearing?: no  Fall  Screening Falls in the past year?: 0 Number of falls in past year: 0 Was there an injury with Fall?: 0 Fall Risk Category Calculator: 0 Patient Fall Risk Level: Low Fall Risk  Fall Risk Patient at Risk for Falls Due to: No Fall Risks Fall risk Follow up: Falls evaluation completed; Falls prevention discussed  Home and Transportation Safety: All rugs have non-skid backing?: yes All stairs or steps have railings?: yes (outside) Grab bars in the bathtub or shower?: (!) no Have non-skid surface in bathtub or shower?: yes Good home lighting?: yes Regular seat belt use?: yes Hospital stays in the last year:: no  Cognitive Assessment Difficulty concentrating, remembering, or making decisions? : no Will 6CIT or Mini Cog be Completed: yes What year is it?: 0 points What month is it?: 0 points Give patient an address phrase to remember (5 components): [address] [place], [place] About what time is it?: 0 points Count backwards from 20 to 1: 0 points Say the months of the year in reverse: 0 points Repeat the address phrase from earlier: 0 points 6 CIT Score: 0 points  Advance Directives (For Healthcare) Does Patient Have a Medical Advance Directive?: Yes Does patient want to make changes to medical advance directive?: Yes (Inpatient - patient requests chaplain consult to change a medical advance directive) Type of Advance Directive: Healthcare Power of Worth; Living will Copy of Healthcare Power of Attorney in Chart?: No - copy requested Copy of Living Will in Chart?: No - copy requested  Reviewed/Updated  Reviewed/Updated: Reviewed All (Medical, Surgical, Family, Medications, Allergies, Care Teams, Patient Goals)    Allergies (verified) Hycodan [hydrocodone bit-homatrop mbr]   Current Medications (verified) Outpatient Encounter Medications as of 05/06/2024  Medication Sig   amLODipine  (NORVASC ) 10 MG tablet Take 1 tablet (10 mg total) by mouth daily.   atorvastatin   (LIPITOR) 40 MG  tablet Take 1 tablet (40 mg total) by mouth daily.   triamterene -hydrochlorothiazide (MAXZIDE-25) 37.5-25 MG tablet TAKE 1 TABLET BY MOUTH EVERY DAY   [DISCONTINUED] benzonatate  (TESSALON ) 100 MG capsule Take 1 capsule (100 mg total) by mouth 3 (three) times daily as needed.   No facility-administered encounter medications on file as of 05/06/2024.    History: Past Medical History:  Diagnosis Date   Bladder cancer (HCC) RECURRENT    UROLOGIST-  DR [facility]   Duplex kidney    Duplex kidney    RIGHT   History of adenomatous polyp of colon    History of recurrent UTIs    Hyperlipidemia    Hypertension    Wears contact lenses    Past Surgical History:  Procedure Laterality Date   BENIGN BREAST BX Right 1995   BUNIONECTOMY Right 2000   bunionectomy left foot  04/2016   COLONOSCOPY W/ POLYPECTOMY  01/06/2013   single sessile polyp ascending colon.  [facility].   CYSTOSCOPY W/ RETROGRADES Bilateral 12/10/2013   Procedure: RETROGRADE PYELOGRAM  ;  Surgeon: Ricardo Likens, MD;  Location: [facility];  Service: Urology;  Laterality: Bilateral;   CYSTOSCOPY W/ RETROGRADES Bilateral 07/15/2014   Procedure: CYSTOSCOPY WITH RETROGRADE PYELOGRAM AND INSTILLATION OF MITOMYCIN  C;  Surgeon: Ricardo Likens, MD;  Location: [facility];  Service: Urology;  Laterality: Bilateral;   CYSTOSCOPY W/ RETROGRADES Bilateral 12/31/2015   Procedure: CYSTOSCOPY WITH RETROGRADE PYELOGRAM;  Surgeon: Ricardo Likens, MD;  Location: [facility];  Service: Urology;  Laterality: Bilateral;   CYSTOSCOPY W/ RETROGRADES Bilateral 11/17/2016   Procedure: CYSTOSCOPY WITH RETROGRADE PYELOGRAM;  Surgeon: Likens Ricardo, MD;  Location: [facility];  Service: Urology;  Laterality: Bilateral;   CYSTOSCOPY W/ RETROGRADES Bilateral 06/13/2019   Procedure: CYSTOSCOPY WITH RETROGRADE PYELOGRAM;  Surgeon: Likens Ricardo, MD;  Location: [facility];   Service: Urology;  Laterality: Bilateral;   CYSTOSCOPY W/ RETROGRADES Bilateral 01/21/2021   Procedure: CYSTOSCOPY WITH RETROGRADE PYELOGRAM;  Surgeon: Likens Ricardo, MD;  Location: [facility];  Service: Urology;  Laterality: Bilateral;   CYSTOSCOPY W/ URETERAL STENT PLACEMENT Bilateral 10/22/2013   Procedure: CYSTOSCOPY WITH RETROGRADE PYELOGRAM;  Surgeon: Ricardo Likens, MD;  Location: [facility];  Service: Urology;  Laterality: Bilateral;   CYSTOSCOPY W/ URETERAL STENT PLACEMENT Left 10/03/2019   Procedure: CYSTOSCOPY WITH STENT REPLACEMENT;  Surgeon: Likens Ricardo, MD;  Location: [facility];  Service: Urology;  Laterality: Left;   CYSTOSCOPY WITH BIOPSY N/A 01/21/2021   Procedure: CYSTOSCOPY WITH BIOPSY;  Surgeon: Likens Ricardo, MD;  Location: [facility];  Service: Urology;  Laterality: N/A;   CYSTOSCOPY WITH FULGERATION N/A 01/21/2021   Procedure: CYSTOSCOPY WITH FULGERATION;  Surgeon: Likens Ricardo, MD;  Location: [facility];  Service: Urology;  Laterality: N/A;   CYSTOSCOPY WITH RETROGRADE PYELOGRAM, URETEROSCOPY AND STENT PLACEMENT Bilateral 04/14/2015   Procedure: CYSTOSCOPY WITH RETROGRADE PYELOGRAM, URETEROSCOPY AND RIGHT STENT PLACEMENT;  Surgeon: Ricardo Likens, MD;  Location: [facility];  Service: Urology;  Laterality: Bilateral;   TONSILLECTOMY  62   (age 41)   TRANSURETHRAL RESECTION OF BLADDER TUMOR N/A 12/31/2015   Procedure: TRANSURETHRAL RESECTION OF BLADDER TUMOR (TURBT) WITH GYRUS;  Surgeon: Ricardo Likens, MD;  Location: [facility];  Service: Urology;  Laterality: N/A;   TRANSURETHRAL RESECTION OF BLADDER TUMOR N/A 11/17/2016   Procedure: TRANSURETHRAL RESECTION OF BLADDER TUMOR (TURBT);  Surgeon: Likens Ricardo, MD;  Location: [facility];  Service: Urology;  Laterality: N/A;   TRANSURETHRAL RESECTION OF BLADDER TUMOR N/A 06/13/2019   Procedure: TRANSURETHRAL  RESECTION OF BLADDER TUMOR (TURBT). LEFT DIAGNOSTIC URETEROSCOPY AND LEFT STENT PLACEMENT;  Surgeon: Alvaro Hummer, MD;  Location: [facility];  Service: Urology;  Laterality: N/A;  45 MINS   TRANSURETHRAL RESECTION OF BLADDER TUMOR WITH GYRUS (TURBT-GYRUS) N/A 10/22/2013   Procedure: TRANSURETHRAL RESECTION OF BLADDER TUMOR WITH ERBE.;  Surgeon: Hummer Alvaro, MD;  Location: [facility];  Service: Urology;  Laterality: N/A;   TRANSURETHRAL RESECTION OF BLADDER TUMOR WITH GYRUS (TURBT-GYRUS) N/A 12/10/2013   Procedure: RESTAGING TRANSURETHRAL RESECTION OF BLADDER TUMOR WITH GYRUS (TURBT-GYRUS);  Surgeon: Hummer Alvaro, MD;  Location: [facility];  Service: Urology;  Laterality: N/A;   TRANSURETHRAL RESECTION OF BLADDER TUMOR WITH GYRUS (TURBT-GYRUS) N/A 07/15/2014   Procedure: TRANSURETHRAL RESECTION OF BLADDER TUMOR WITH GYRUS (TURBT-GYRUS);  Surgeon: Hummer Alvaro, MD;  Location: [facility];  Service: Urology;  Laterality: N/A;   TRANSURETHRAL RESECTION OF BLADDER TUMOR WITH GYRUS (TURBT-GYRUS) N/A 04/14/2015   Procedure: TRANSURETHRAL RESECTION OF BLADDER TUMOR WITH GYRUS (TURBT-GYRUS);  Surgeon: Hummer Alvaro, MD;  Location: [facility];  Service: Urology;  Laterality: N/A;   Family History  Problem Relation Age of Onset   Hypertension Mother    Arthritis Mother        knee replacement; shoulder replacement   Heart disease Mother        AAA; mild AMI in 2018   Stroke Mother 30       TIA   Cancer Father        prostate cancer   Mental illness Sister 77       bipolar disorder   Cancer Brother        prostate cancer   Cancer Brother 74       Melanoma   Social History   Occupational History   Occupation: retired    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  Tobacco Use   Smoking status: Former    Current packs/day: 0.00    Average packs/day: 0.2 packs/day for 20.0 years (3.0 ttl  pk-yrs)    Types: Cigarettes    Start date: 10/17/1992    Quit date: 10/17/2012    Years since quitting: 11.5   Smokeless tobacco: Never  Vaping Use   Vaping status: Never Used  Substance and Sexual Activity   Alcohol use: Yes    Alcohol/week: 1.0 standard drink of alcohol    Types: 1 Glasses of wine per week    Comment: OCCASIONAL   Drug use: No   Sexual activity: Not Currently    Birth control/protection: Post-menopausal   Tobacco Counseling Counseling given: Yes  SDOH Screenings   Food Insecurity: No Food Insecurity (05/06/2024)  Housing: Low Risk (05/06/2024)  Transportation Needs: No Transportation Needs (05/06/2024)  Utilities: Not At Risk (05/06/2024)  Alcohol Screen: Low Risk (05/02/2024)  Depression (PHQ2-9): Low Risk (05/06/2024)  Financial Resource Strain: Low Risk (05/02/2024)  Physical Activity: Insufficiently Active (05/06/2024)  Social Connections: Moderately Isolated (05/06/2024)  Stress: No Stress Concern Present (05/06/2024)  Tobacco Use: Medium Risk (05/06/2024)  Health Literacy: Adequate Health Literacy (05/06/2024)   See flowsheets for full screening details  Depression Screen PHQ 2 & 9 Depression Scale- Over the past 2 weeks, how often have you been bothered by any of the following problems? Little interest or pleasure in doing things: 0 Feeling down, depressed, or hopeless (PHQ Adolescent also  includes...irritable): 0 PHQ-2 Total Score: 0 Trouble falling or staying asleep, or sleeping too much: 0 Feeling tired or having little energy: 0 Poor appetite or overeating (PHQ Adolescent also includes...weight loss): 0 Feeling bad about yourself - or that you are a failure or have let yourself or your family down: 0 Trouble concentrating on things, such as reading the newspaper or watching television (PHQ Adolescent also includes...like school work): 0 Moving or speaking so slowly that other people could have noticed. Or the opposite - being so fidgety or restless that  you have been moving around a lot more than usual: 0 Thoughts that you would be better off dead, or of hurting yourself in some way: 0 PHQ-9 Total Score: 0 If you checked off any problems, how difficult have these problems made it for you to do your work, take care of things at home, or get along with other people?: Not difficult at all  Depression Treatment Depression Interventions/Treatment : EYV7-0 Score <4 Follow-up Not Indicated     Goals Addressed               This Visit's Progress     Patient Stated (pt-stated)        Patient stated she plans to continue stretching/walking/swimming and ride bike             Objective:    Today's Vitals   05/06/24 1152  Weight: 209 lb (94.8 kg)  Height: 5' 11 (1.803 m)   Body mass index is 29.15 kg/m.  Hearing/Vision screen Hearing Screening - Comments:: Denies hearing difficulties   Vision Screening - Comments:: Wears eyeglasses for reading and contact lenses - up to date with routine eye exams with Deanne Mate Immunizations and Health Maintenance Health Maintenance  Topic Date Due   COVID-19 Vaccine (8 - 2025-26 season) 12/10/2023   Medicare Annual Wellness (AWV)  05/06/2025   DTaP/Tdap/Td (2 - Td or Tdap) 09/10/2030   Pneumococcal Vaccine: 50+ Years  Completed   Influenza Vaccine  Completed   Bone Density Scan  Completed   Hepatitis C Screening  Completed   Zoster Vaccines- Shingrix  Completed   Meningococcal B Vaccine  Aged Out   Mammogram  Discontinued   Colonoscopy  Discontinued        Assessment/Plan:  This is a routine wellness examination for [place].  Patient Care Team: Joshua Debby CROME, MD as PCP - General (Internal Medicine) Lennard Lesta FALCON, MD as Consulting Physician (Gastroenterology) Alvaro Ricardo KATHEE Raddle., MD as Consulting Physician (Urology) Livingston Rigg, MD as Consulting Physician (Dermatology) Mate Deanne BRAVO, OD as Consulting Physician (Optometry)  I have personally reviewed and noted  the following in the patients chart:   Medical and social history Use of alcohol, tobacco or illicit drugs  Current medications and supplements including opioid prescriptions. Functional ability and status Nutritional status Physical activity Advanced directives List of other physicians Hospitalizations, surgeries, and ER visits in previous 12 months Vitals Screenings to include cognitive, depression, and falls Referrals and appointments  No orders of the defined types were placed in this encounter.  In addition, I have reviewed and discussed with patient certain preventive protocols, quality metrics, and best practice recommendations. A written personalized care plan for preventive services as well as general preventive health recommendations were provided to patient.   Verdie CHRISTELLA Saba, CMA   05/06/2024   Return in 1 year (on 05/06/2025).  After Visit Summary: (MyChart) Due to this being a telephonic visit, the after visit summary with  patients personalized plan was offered to patient via MyChart   Nurse Notes: scheduled 2027 AWV appt "

## 2024-05-11 ENCOUNTER — Other Ambulatory Visit: Payer: Self-pay | Admitting: Internal Medicine

## 2024-05-11 DIAGNOSIS — I10 Essential (primary) hypertension: Secondary | ICD-10-CM

## 2024-05-14 ENCOUNTER — Other Ambulatory Visit: Payer: Self-pay | Admitting: Internal Medicine

## 2024-05-14 DIAGNOSIS — I7121 Aneurysm of the ascending aorta, without rupture: Secondary | ICD-10-CM

## 2024-10-14 ENCOUNTER — Ambulatory Visit: Admitting: Dermatology

## 2025-01-05 ENCOUNTER — Encounter: Admitting: Internal Medicine

## 2025-05-07 ENCOUNTER — Ambulatory Visit

## 2025-05-08 ENCOUNTER — Ambulatory Visit
